# Patient Record
Sex: Male | Born: 1945 | Hispanic: No | Smoking: Never smoker
Health system: Southern US, Community
[De-identification: ages and names within clinical notes are randomized; demographics above are authoritative.]

## PROBLEM LIST (undated history)

## (undated) DIAGNOSIS — K635 Polyp of colon: Secondary | ICD-10-CM

## (undated) DIAGNOSIS — I639 Cerebral infarction, unspecified: Secondary | ICD-10-CM

## (undated) DIAGNOSIS — I2510 Atherosclerotic heart disease of native coronary artery without angina pectoris: Secondary | ICD-10-CM

## (undated) DIAGNOSIS — Z972 Presence of dental prosthetic device (complete) (partial): Secondary | ICD-10-CM

## (undated) DIAGNOSIS — N452 Orchitis: Secondary | ICD-10-CM

## (undated) DIAGNOSIS — I10 Essential (primary) hypertension: Secondary | ICD-10-CM

## (undated) DIAGNOSIS — E785 Hyperlipidemia, unspecified: Secondary | ICD-10-CM

## (undated) DIAGNOSIS — M1990 Unspecified osteoarthritis, unspecified site: Secondary | ICD-10-CM

## (undated) DIAGNOSIS — I739 Peripheral vascular disease, unspecified: Secondary | ICD-10-CM

## (undated) DIAGNOSIS — E119 Type 2 diabetes mellitus without complications: Secondary | ICD-10-CM

## (undated) DIAGNOSIS — R251 Tremor, unspecified: Secondary | ICD-10-CM

## (undated) HISTORY — PX: CAROTID ENDARTERECTOMY: SUR193

## (undated) HISTORY — PX: AMPUTATION TOE: SHX6595

## (undated) HISTORY — PX: CORONARY ARTERY BYPASS GRAFT: SHX141

## (undated) HISTORY — PX: ROTATOR CUFF REPAIR: SHX139

## (undated) HISTORY — PX: COLON SURGERY: SHX602

---

## 2007-02-28 ENCOUNTER — Ambulatory Visit: Payer: Self-pay | Admitting: Family Medicine

## 2007-10-11 ENCOUNTER — Ambulatory Visit: Payer: Self-pay | Admitting: Family Medicine

## 2008-05-29 ENCOUNTER — Ambulatory Visit: Payer: Self-pay | Admitting: Unknown Physician Specialty

## 2008-05-29 ENCOUNTER — Ambulatory Visit: Payer: Self-pay | Admitting: Cardiology

## 2008-07-11 ENCOUNTER — Ambulatory Visit: Payer: Self-pay | Admitting: Unknown Physician Specialty

## 2008-11-19 ENCOUNTER — Ambulatory Visit: Payer: Self-pay | Admitting: Family Medicine

## 2008-11-27 ENCOUNTER — Ambulatory Visit: Payer: Self-pay | Admitting: Family Medicine

## 2009-02-18 ENCOUNTER — Ambulatory Visit: Payer: Self-pay | Admitting: Family Medicine

## 2010-10-13 ENCOUNTER — Encounter: Payer: Self-pay | Admitting: Nurse Practitioner

## 2010-10-13 ENCOUNTER — Encounter: Payer: Self-pay | Admitting: Cardiothoracic Surgery

## 2011-12-17 ENCOUNTER — Ambulatory Visit: Payer: Self-pay | Admitting: Unknown Physician Specialty

## 2015-03-16 ENCOUNTER — Ambulatory Visit
Admission: EM | Admit: 2015-03-16 | Discharge: 2015-03-16 | Disposition: A | Discharge status: Other Acute Inpatient Hospital | Payer: Medicare Other | Attending: Family Medicine | Admitting: Family Medicine

## 2015-03-16 ENCOUNTER — Emergency Department
Admission: EM | Admit: 2015-03-16 | Discharge: 2015-03-16 | Disposition: A | Discharge status: Home / Self Care | Payer: Medicare Other | Attending: Emergency Medicine | Admitting: Emergency Medicine

## 2015-03-16 ENCOUNTER — Emergency Department: Payer: Medicare Other

## 2015-03-16 ENCOUNTER — Encounter: Payer: Self-pay | Admitting: Gynecology

## 2015-03-16 DIAGNOSIS — E119 Type 2 diabetes mellitus without complications: Secondary | ICD-10-CM | POA: Diagnosis not present

## 2015-03-16 DIAGNOSIS — Z7984 Long term (current) use of oral hypoglycemic drugs: Secondary | ICD-10-CM | POA: Diagnosis not present

## 2015-03-16 DIAGNOSIS — L03116 Cellulitis of left lower limb: Secondary | ICD-10-CM | POA: Diagnosis not present

## 2015-03-16 DIAGNOSIS — Z7982 Long term (current) use of aspirin: Secondary | ICD-10-CM | POA: Insufficient documentation

## 2015-03-16 DIAGNOSIS — E1169 Type 2 diabetes mellitus with other specified complication: Secondary | ICD-10-CM

## 2015-03-16 DIAGNOSIS — Z7902 Long term (current) use of antithrombotics/antiplatelets: Secondary | ICD-10-CM | POA: Insufficient documentation

## 2015-03-16 DIAGNOSIS — L089 Local infection of the skin and subcutaneous tissue, unspecified: Secondary | ICD-10-CM | POA: Diagnosis not present

## 2015-03-16 DIAGNOSIS — E11610 Type 2 diabetes mellitus with diabetic neuropathic arthropathy: Secondary | ICD-10-CM

## 2015-03-16 DIAGNOSIS — I10 Essential (primary) hypertension: Secondary | ICD-10-CM | POA: Diagnosis not present

## 2015-03-16 DIAGNOSIS — Z79899 Other long term (current) drug therapy: Secondary | ICD-10-CM | POA: Diagnosis not present

## 2015-03-16 DIAGNOSIS — L03119 Cellulitis of unspecified part of limb: Secondary | ICD-10-CM

## 2015-03-16 DIAGNOSIS — L539 Erythematous condition, unspecified: Secondary | ICD-10-CM | POA: Diagnosis present

## 2015-03-16 DIAGNOSIS — Z794 Long term (current) use of insulin: Secondary | ICD-10-CM | POA: Diagnosis not present

## 2015-03-16 DIAGNOSIS — E11628 Type 2 diabetes mellitus with other skin complications: Secondary | ICD-10-CM

## 2015-03-16 HISTORY — DX: Orchitis: N45.2

## 2015-03-16 HISTORY — DX: Atherosclerotic heart disease of native coronary artery without angina pectoris: I25.10

## 2015-03-16 HISTORY — DX: Hyperlipidemia, unspecified: E78.5

## 2015-03-16 HISTORY — DX: Peripheral vascular disease, unspecified: I73.9

## 2015-03-16 HISTORY — DX: Type 2 diabetes mellitus without complications: E11.9

## 2015-03-16 HISTORY — DX: Tremor, unspecified: R25.1

## 2015-03-16 HISTORY — DX: Polyp of colon: K63.5

## 2015-03-16 HISTORY — DX: Essential (primary) hypertension: I10

## 2015-03-16 LAB — CBC WITH DIFFERENTIAL/PLATELET
BASOS ABS: 0 10*3/uL (ref 0–0.1)
BASOS PCT: 1 %
EOS PCT: 3 %
Eosinophils Absolute: 0.2 10*3/uL (ref 0–0.7)
HEMATOCRIT: 37 % — AB (ref 40.0–52.0)
HEMOGLOBIN: 12.8 g/dL — AB (ref 13.0–18.0)
LYMPHS PCT: 16 %
Lymphs Abs: 1 10*3/uL (ref 1.0–3.6)
MCH: 30.2 pg (ref 26.0–34.0)
MCHC: 34.5 g/dL (ref 32.0–36.0)
MCV: 87.6 fL (ref 80.0–100.0)
Monocytes Absolute: 0.9 10*3/uL (ref 0.2–1.0)
Monocytes Relative: 14 %
NEUTROS ABS: 4.3 10*3/uL (ref 1.4–6.5)
Neutrophils Relative %: 66 %
Platelets: 216 10*3/uL (ref 150–440)
RBC: 4.23 MIL/uL — AB (ref 4.40–5.90)
RDW: 12.9 % (ref 11.5–14.5)
WBC: 6.4 10*3/uL (ref 3.8–10.6)

## 2015-03-16 LAB — COMPREHENSIVE METABOLIC PANEL
ALBUMIN: 3.5 g/dL (ref 3.5–5.0)
ALK PHOS: 79 U/L (ref 38–126)
ALT: 15 U/L — AB (ref 17–63)
AST: 16 U/L (ref 15–41)
Anion gap: 6 (ref 5–15)
BILIRUBIN TOTAL: 0.9 mg/dL (ref 0.3–1.2)
BUN: 16 mg/dL (ref 6–20)
CALCIUM: 9.4 mg/dL (ref 8.9–10.3)
CO2: 28 mmol/L (ref 22–32)
CREATININE: 0.81 mg/dL (ref 0.61–1.24)
Chloride: 101 mmol/L (ref 101–111)
GFR calc Af Amer: >60 mL/min (ref >60)
GFR calc non Af Amer: >60 mL/min (ref >60)
GLUCOSE: 351 mg/dL — AB (ref 65–99)
POTASSIUM: 4 mmol/L (ref 3.5–5.1)
Sodium: 135 mmol/L (ref 135–145)
TOTAL PROTEIN: 6.7 g/dL (ref 6.5–8.1)

## 2015-03-16 MED ORDER — DOXYCYCLINE HYCLATE 100 MG PO TABS
100.0000 mg | ORAL_TABLET | Freq: Once | ORAL | Status: AC
  Administered 2015-03-16: 100 mg via ORAL
  Filled 2015-03-16: qty 1

## 2015-03-16 MED ORDER — DOXYCYCLINE HYCLATE 100 MG PO TBEC: 100.0000 mg | DELAYED_RELEASE_TABLET | Freq: Two times a day (BID) | ORAL | Status: DC

## 2015-03-16 NOTE — ED Notes (Addendum)
Patient right bottom  of foot infection. Patient also has redness on top of right foot and warm to the touch. Patient is also diabetic.

## 2015-03-16 NOTE — ED Provider Notes (Signed)
[facility] Emergency Department Provider Note  ____________________________________________    I have reviewed the triage vital signs and the nursing notes.   HISTORY  Chief Complaint Skin Ulcer    HPI Jason [name] is a 70 y.o. male who was sent over from urgent care for evaluation of the foot ulcer. Patient reports he did not know that he had redness on his foot until his grandkids noticed it. He denies fevers or chills. He reports redness on his left foot. He does also report a chronic callous on the bottom of his foot which she reports is unchanged. He denies injury to the foot     Past Medical History  Diagnosis Date  . Diabetes mellitus without complication (HCC)   . Hypertension   . Hyperlipemia   . PVD (peripheral vascular disease) (HCC)   . Colon polyp   . Orchitis   . Tremor   . CAD (coronary artery disease)     There are no active problems to display for this patient.   Past Surgical History  Procedure Laterality Date  . Coronary artery bypass graft    . Amputation toe Left     big  . Carotid endarterectomy Right   . Rotator cuff repair Right   . Colon surgery      Current Outpatient Rx  Name  Route  Sig  Dispense  Refill  . aspirin 81 MG tablet   Oral   Take 81 mg by mouth daily.         . clopidogrel (PLAVIX) 75 MG tablet   Oral   Take 75 mg by mouth daily.         [name] doxycycline (DORYX) 100 MG EC tablet   Oral   Take 1 tablet (100 mg total) by mouth 2 (two) times daily.   20 tablet   0   . glimepiride (AMARYL) 4 MG tablet   Oral   Take 4 mg by mouth daily with breakfast.         . insulin glargine (LANTUS) 100 UNIT/ML injection   Subcutaneous   Inject into the skin at bedtime.         . insulin regular (NOVOLIN R,HUMULIN R) 100 units/mL injection   Subcutaneous   Inject into the skin 3 (three) times daily before meals.         . Magnesium 250 MG TABS   Oral   Take by mouth.         .  metFORMIN (GLUCOPHAGE) 1000 MG tablet   Oral   Take 1,000 mg by mouth 2 (two) times daily with a meal.         . Multiple Vitamin (MULTIVITAMIN) capsule   Oral   Take 1 capsule by mouth daily.         . Potassium Gluconate 595 MG CAPS   Oral   Take by mouth.         . pravastatin (PRAVACHOL) 40 MG tablet   Oral   Take 40 mg by mouth daily.         . propranolol (INDERAL) 60 MG tablet   Oral   Take 60 mg by mouth 3 (three) times daily.         . ramipril (ALTACE) 10 MG capsule   Oral   Take 10 mg by mouth daily.           Allergies Review of patient's allergies indicates no known allergies.  Family History  Problem Relation Age of Onset  . Diabetes Mother   . Diabetes Father     Social History Social History  Substance Use Topics  . Smoking status: Never Smoker   . Smokeless tobacco: Not on file  . Alcohol Use: No    Review of Systems  Constitutional: Negative for fever. Eyes: Negative for visual changes. ENT: Negative for sore throat Cardiovascular: Negative for chest pain. Respiratory: Negative for shortness of breath. Gastrointestinal: Negative for abdominal pain,.  Musculoskeletal: Negative for foot pain Skin: As above Neurological: Decreased sensation in both feet, chronic Psychiatric: No anxiety    ____________________________________________   PHYSICAL EXAM:  VITAL SIGNS: ED Triage Vitals  Enc Vitals Group     BP 03/16/15 1535 152/65 mmHg     Pulse Rate 03/16/15 1535 63     Resp 03/16/15 1535 18     Temp 03/16/15 1535 98.2 F (36.8 C)     Temp Source 03/16/15 1605 Oral     SpO2 03/16/15 1535 97 %     Weight --      Height --      Head Cir --      Peak Flow --      Pain Score 03/16/15 1535 0     Pain Loc --      Pain Edu? --      Excl. in GC? --      Constitutional: Alert and oriented. Well appearing and in no distress. Eyes: Conjunctivae are normal.  ENT   Head: Normocephalic and atraumatic.    Mouth/Throat: Mucous membranes are moist. Cardiovascular: Normal rate, regular rhythm. Normal and symmetric distal pulses are present in all extremities.  Respiratory: Normal respiratory effort without tachypnea nor retractions.  Gastrointestinal: Soft and non-tender in all quadrants. No distention. There is no CVA tenderness. Genitourinary: deferred Musculoskeletal: Nontender with normal range of motion in all extremities. Area of erythema to the top of the left foot laterally, patient reports mild tenderness to palpation. There is a chronic-appearing callus on the plantar surface of the center of the foot with no discharge or surrounding erythema Neurologic:  Normal speech and language. No gross focal neurologic deficits are appreciated. Skin:  Skin is warm, dry and intact. No rash noted. Psychiatric: Mood and affect are normal. Patient exhibits appropriate insight and judgment.  ____________________________________________    LABS (pertinent positives/negatives)  Labs Reviewed  CBC WITH DIFFERENTIAL/PLATELET - Abnormal; Notable for the following:    RBC 4.23 (*)    Hemoglobin 12.8 (*)    HCT 37.0 (*)    All other components within normal limits  COMPREHENSIVE METABOLIC PANEL - Abnormal; Notable for the following:    Glucose, Bld 351 (*)    ALT 15 (*)    All other components within normal limits    ____________________________________________   EKG  None  ____________________________________________    RADIOLOGY I have personally reviewed any xrays that were ordered on this patient: X-ray of the foot demonstrates  Charcot appearance  ____________________________________________   PROCEDURES  Procedure(s) performed: none  Critical Care performed: none  ____________________________________________   INITIAL IMPRESSION / ASSESSMENT AND PLAN / ED COURSE  Pertinent labs & imaging results that were available during my care of the patient were reviewed by me and  considered in my medical decision making (see chart for details).  Patient presents with cellulitis of the foot. He is a diabetic. His labs are very reassuring. He is in no distress and his vitals are normal. His x-ray is  not concerning for osteomyelitis. Patient has good follow-up with podiatry. We will start him on antibiotics here today and he will follow-up with podiatry early this week. Return precautions discussed  ____________________________________________   FINAL CLINICAL IMPRESSION(S) / ED DIAGNOSES  Final diagnoses:  Cellulitis of foot     Jene Everyobert Tandrea Kommer, MD 03/16/15 2125

## 2015-03-16 NOTE — Discharge Instructions (Signed)
How to Avoid Diabetes Problems  You can do a lot to prevent or slow down diabetes problems. Following your diabetes plan and taking care of yourself can reduce your risk of serious or life-threatening complications. Below, you will find certain things you can do to prevent diabetes problems.  MANAGE YOUR DIABETES  Follow your health care provider's, nurse educator's, and dietitian's instructions for managing your diabetes. They will teach you the basics of diabetes care. They can help answer questions you may have. Learn about diabetes and make healthy choices regarding eating and physical activity. Monitor your blood glucose level regularly. Your health care provider will help you decide how often to check your blood glucose level depending on your treatment goals and how well you are meeting them.   DO NOT USE NICOTINE  Nicotine and diabetes are a dangerous combination. Nicotine raises your risk for diabetes problems. If you quit using nicotine, you will lower your risk for heart attack, stroke, nerve disease, and kidney disease. Your cholesterol and your blood pressure levels may improve. Your blood circulation will also improve. Do not use any tobacco products, including cigarettes, chewing tobacco, or electronic cigarettes. If you need help quitting, ask your health care provider.  KEEP YOUR BLOOD PRESSURE UNDER CONTROL  Your health care provider will determine your individualized target blood pressure based on your age, your medicines, how long you have had diabetes, and any other medical conditions you have. Blood pressure consists of two numbers. Generally, the goal is to keep your top number (systolic pressure) at or below 130, and your bottom number (diastolic pressure) at or below 80. Your health care provider may recommend a lower target blood pressure reading, if appropriate. Meal planning, medicines, and exercise can help you reach your target blood pressure. Make sure your health care provider checks  your blood pressure at every visit.  KEEP YOUR CHOLESTEROL UNDER CONTROL  Normal cholesterol levels will help prevent heart disease and stroke. These are the biggest health problems for people with diabetes. Keeping cholesterol levels under control can also help with blood flow. Have your cholesterol level checked at least once a year. Your health care provider may prescribe a medicine known as a statin. Statins lower your cholesterol. If you are not taking a statin, ask your health care provider if you should be. Meal planning, exercise, and medicines can help you reach your cholesterol targets.   SCHEDULE AND KEEP YOUR ANNUAL PHYSICAL EXAMS AND EYE EXAMS  Your health care provider will tell you how often he or she wants to see you depending on your plan of treatment. It is important that you keep these appointments so that possible problems can be identified early and complications can be avoided or treated.  · Every visit with your health care provider should include your weight, blood pressure, and an evaluation of your blood glucose control.  · Your hemoglobin A1c should be checked:    At least twice a year if you are at your goal.    Every 3 months if there are changes in treatment.    If you are not meeting your goals.  · Your blood lipids should be checked yearly. You should also be checked yearly to see if you have protein in your urine (microalbumin).  · Schedule a dilated eye exam within 5 years of your diagnosis if you have type 1 diabetes, and then yearly. Schedule a dilated eye exam at diagnosis if you have type 2 diabetes, and then yearly. All   exams thereafter can be extended to every 2 to 3 years if one or more exams have been normal.  KEEP YOUR VACCINES CURRENT  It is recommended that you receive a flu (influenza) vaccine every year. It is also recommended that you receive a pneumonia (pneumococcal) vaccine. If you are 65 years of age or older and have never received a pneumonia vaccine, this  vaccine may be given as a series of two separate shots. Ask your health care provider which additional vaccines may be recommended.  TAKE CARE OF YOUR FEET   Diabetes may cause you to have a poor blood supply (circulation) to your legs and feet. Because of this, the skin may be thinner, break easier, and heal more slowly. You also may have nerve damage in your legs and feet, causing decreased feeling. You may not notice minor injuries to your feet that could lead to serious problems or infections. Taking care of your feet is very important.  Visual foot exams are performed at every routine medical visit. The exams check for cuts, injuries, or other problems with the feet. A comprehensive foot exam should be done yearly. This includes visual inspection as well as assessing foot pulses and testing for loss of sensation. You should also do the following:  · Inspect your feet daily for cuts, calluses, blisters, ingrown toenails, and signs of infection, such as redness, swelling, or pus.  · Wash and dry your feet thoroughly, especially between the toes.  · Avoid soaking your feet regularly in hot water baths.  · Moisturize dry skin with lotion, avoiding areas between your toes.  · Cut toenails straight across and file the edges.  · Avoid shoes that do not fit well or have areas that irritate your skin.  · Avoid going barefooted or wearing only socks. Your feet need protection.  TAKE CARE OF YOUR TEETH  People with poorly controlled diabetes are more likely to have gum (periodontal) disease. These infections make diabetes harder to control. Periodontal diseases, if left untreated, can lead to tooth loss. Brush your teeth twice a day, floss, and see your dentist for checkups and cleaning every 6 months, or 2 times a year.  ASK YOUR HEALTH CARE PROVIDER ABOUT TAKING ASPIRIN  Taking aspirin daily is recommended to help prevent cardiovascular disease in people with and without diabetes. Ask your health care provider if this  would benefit you and what dose he or she would recommend.  DRINK RESPONSIBLY  Moderate amounts of alcohol (less than 1 drink per day for adult women and less than 2 drinks per day for adult men) have a minimal effect on blood glucose if ingested with food. It is important to eat food with alcohol to avoid hypoglycemia. People should avoid alcohol if they have a history of alcohol abuse or dependence, if they are pregnant, and if they have liver disease, pancreatitis, advanced neuropathy, or severe hypertriglyceridemia.  LESSEN STRESS  Living with diabetes can be stressful. When you are under stress, your blood glucose may be affected in two ways:  · Stress hormones may cause your blood glucose to rise.  · You may be distracted from taking good care of yourself.  It is a good idea to be aware of your stress level and make changes that are necessary to help you better manage challenging situations. Support groups, planned relaxation, a hobby you enjoy, meditation, healthy relationships, and exercise all work to lower your stress level. If your efforts do not seem to be helping,   get help from your health care provider or a trained mental health professional.     This information is not intended to replace advice given to you by your health care provider. Make sure you discuss any questions you have with your health care provider.     Document Released: 09/15/2010 Document Revised: 01/18/2014 Document Reviewed: 02/21/2013  Elsevier Interactive Patient Education ©2016 [employer]

## 2015-03-16 NOTE — ED Notes (Signed)
Pt discharged to home.  Discharge instructions reviewed.  Verbalized understanding.  No questions or concerns at this time.  Teach back verified.  Pt in NAD.  No items left in ED.   

## 2015-03-16 NOTE — ED Notes (Signed)
Pt resting comfortably at this time.

## 2015-03-16 NOTE — ED Provider Notes (Signed)
CSN: [id number]     Arrival date & time 03/16/15  1053 History   First MD Initiated Contact with Patient 03/16/15 1245     Chief Complaint  Patient presents with  . Foot Pain   (Consider location/radiation/quality/duration/timing/severity/associated sxs/prior Treatment) HPI   Is a 70 year old insulin-dependent diabetic presents with a right Charcot foot infection. He states he's had is for a week and 2 days ago noticed the redness over the lateral dorsal foot. He has very little sensation in that foot and the infection was brought to his attention by his great-grandchildren. He states that his blood sugars have remained fairly steady he has not had any fever or chills.  Past Medical History  Diagnosis Date  . Diabetes mellitus without complication (HCC)   . Hypertension   . Hyperlipemia   . PVD (peripheral vascular disease) (HCC)   . Colon polyp   . Orchitis   . Tremor   . CAD (coronary artery disease)    Past Surgical History  Procedure Laterality Date  . Coronary artery bypass graft    . Amputation toe Left     big  . Carotid endarterectomy Right   . Rotator cuff repair Right   . Colon surgery     Family History  Problem Relation Age of Onset  . Diabetes Mother   . Diabetes Father    Social History  Substance Use Topics  . Smoking status: Never Smoker   . Smokeless tobacco: None  . Alcohol Use: No    Review of Systems  Constitutional: Positive for activity change. Negative for fever, chills, diaphoresis and fatigue.  Skin: Positive for color change and wound.  All other systems reviewed and are negative.   Allergies  Review of patient's allergies indicates no known allergies.  Home Medications   Prior to Admission medications   Medication Sig Start Date End Date Taking? Authorizing Provider  aspirin 81 MG tablet Take 81 mg by mouth daily.   Yes Historical Provider, MD  clopidogrel (PLAVIX) 75 MG tablet Take 75 mg by mouth daily.   Yes Historical Provider, MD   glimepiride (AMARYL) 4 MG tablet Take 4 mg by mouth daily with breakfast.   Yes Historical Provider, MD  insulin glargine (LANTUS) 100 UNIT/ML injection Inject into the skin at bedtime.   Yes Historical Provider, MD  insulin regular (NOVOLIN R,HUMULIN R) 100 units/mL injection Inject into the skin 3 (three) times daily before meals.   Yes Historical Provider, MD  Magnesium 250 MG TABS Take by mouth.   Yes Historical Provider, MD  metFORMIN (GLUCOPHAGE) 1000 MG tablet Take 1,000 mg by mouth 2 (two) times daily with a meal.   Yes Historical Provider, MD  Multiple Vitamin (MULTIVITAMIN) capsule Take 1 capsule by mouth daily.   Yes Historical Provider, MD  Potassium Gluconate 595 MG CAPS Take by mouth.   Yes Historical Provider, MD  pravastatin (PRAVACHOL) 40 MG tablet Take 40 mg by mouth daily.   Yes Historical Provider, MD  propranolol (INDERAL) 60 MG tablet Take 60 mg by mouth 3 (three) times daily.   Yes Historical Provider, MD  ramipril (ALTACE) 10 MG capsule Take 10 mg by mouth daily.   Yes Historical Provider, MD   Meds Ordered and Administered this Visit  Medications - No data to display  BP 153/75 mmHg  Pulse 60  Temp(Src) 97.9 F (36.6 C) (Oral)  Resp 16  Ht 5\' 7"  (1.702 m)  Wt 213 lb (96.616 kg)  BMI 33.35 kg/m2  SpO2 97% No data found.   Physical Exam  Constitutional: He is oriented to person, place, and time. He appears well-developed and well-nourished. No distress.  HENT:  Head: Normocephalic and atraumatic.  Eyes: Conjunctivae are normal. Pupils are equal, round, and reactive to light.  Neck: Normal range of motion. Neck supple.  Musculoskeletal: He exhibits edema and tenderness.  Examination of the right Charcot foot shows a ulcer on the bottom of the foot that is open areas a small laceration. As a blanchable erythema over the lateral dorsum foot and extending up into the proximal foot and lower leg. The calf is very firm in comparison to the left. It does not appear  to be any change in the diameter in comparison to the left.  Neurological: He is alert and oriented to person, place, and time.  Skin: Skin is warm and dry. He is not diaphoretic. There is erythema.  Psychiatric: He has a normal mood and affect. His behavior is normal. Judgment and thought content normal.  Nursing note and vitals reviewed.   ED Course  Procedures (including critical care time)  Labs Review Labs Reviewed - No data to display  Imaging Review No results found.   Visual Acuity Review  Right Eye Distance:   Left Eye Distance:   Bilateral Distance:    Right Eye Near:   Left Eye Near:    Bilateral Near:         MDM   1. 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 Diabetic infection of right foot (HCC)    . Discharge Medication List as of 03/16/2015  1:17 PM    Plan: 1. Diagnosis reviewed with patient A discussion with the patient regarding her findings today. I recommended that he go to [facility] for further evaluation and treatment. Do not have appropriate medications here to treat him interval he will likely need IV antibiotics. Also likely need debridement of the ulcer on the plantar surface. He understands and will transport himself to [facility]   Lutricia Feil, PA-C 03/16/15 1342

## 2015-03-16 NOTE — ED Notes (Signed)
Pt is a diabetic and has foot ulcer on left foot. Pt seen at urgent care and sent here for further eval.

## 2015-03-16 NOTE — Discharge Instructions (Signed)

## 2016-08-20 ENCOUNTER — Inpatient Hospital Stay
Admission: AD | Admit: 2016-08-20 | Discharge: 2016-08-24 | DRG: 256 | Disposition: A | Discharge status: Home Health | Payer: Medicare Other | Source: Ambulatory Visit | Attending: Internal Medicine | Admitting: Internal Medicine

## 2016-08-20 ENCOUNTER — Encounter: Payer: Self-pay | Admitting: Internal Medicine

## 2016-08-20 DIAGNOSIS — Z7982 Long term (current) use of aspirin: Secondary | ICD-10-CM | POA: Diagnosis not present

## 2016-08-20 DIAGNOSIS — E11621 Type 2 diabetes mellitus with foot ulcer: Secondary | ICD-10-CM | POA: Diagnosis present

## 2016-08-20 DIAGNOSIS — E1152 Type 2 diabetes mellitus with diabetic peripheral angiopathy with gangrene: Principal | ICD-10-CM | POA: Diagnosis present

## 2016-08-20 DIAGNOSIS — Z794 Long term (current) use of insulin: Secondary | ICD-10-CM | POA: Diagnosis not present

## 2016-08-20 DIAGNOSIS — L03032 Cellulitis of left toe: Secondary | ICD-10-CM | POA: Diagnosis present

## 2016-08-20 DIAGNOSIS — Z79899 Other long term (current) drug therapy: Secondary | ICD-10-CM

## 2016-08-20 DIAGNOSIS — I2510 Atherosclerotic heart disease of native coronary artery without angina pectoris: Secondary | ICD-10-CM | POA: Diagnosis present

## 2016-08-20 DIAGNOSIS — E785 Hyperlipidemia, unspecified: Secondary | ICD-10-CM | POA: Diagnosis present

## 2016-08-20 DIAGNOSIS — Z7902 Long term (current) use of antithrombotics/antiplatelets: Secondary | ICD-10-CM | POA: Diagnosis not present

## 2016-08-20 DIAGNOSIS — L97519 Non-pressure chronic ulcer of other part of right foot with unspecified severity: Secondary | ICD-10-CM | POA: Diagnosis present

## 2016-08-20 DIAGNOSIS — L03119 Cellulitis of unspecified part of limb: Secondary | ICD-10-CM

## 2016-08-20 DIAGNOSIS — Z951 Presence of aortocoronary bypass graft: Secondary | ICD-10-CM

## 2016-08-20 DIAGNOSIS — I10 Essential (primary) hypertension: Secondary | ICD-10-CM | POA: Diagnosis present

## 2016-08-20 DIAGNOSIS — Z89412 Acquired absence of left great toe: Secondary | ICD-10-CM

## 2016-08-20 DIAGNOSIS — E11628 Type 2 diabetes mellitus with other skin complications: Secondary | ICD-10-CM

## 2016-08-20 DIAGNOSIS — E1140 Type 2 diabetes mellitus with diabetic neuropathy, unspecified: Secondary | ICD-10-CM | POA: Diagnosis present

## 2016-08-20 DIAGNOSIS — I96 Gangrene, not elsewhere classified: Secondary | ICD-10-CM

## 2016-08-20 LAB — GLUCOSE, CAPILLARY
Glucose-Capillary: 402 mg/dL — ABNORMAL HIGH (ref 65–99)
Glucose-Capillary: 226 mg/dL — ABNORMAL HIGH (ref 65–99)

## 2016-08-20 LAB — CBC
HEMATOCRIT: 37 % — AB (ref 40.0–52.0)
HEMOGLOBIN: 13 g/dL (ref 13.0–18.0)
MCH: 31 pg (ref 26.0–34.0)
MCHC: 35 g/dL (ref 32.0–36.0)
MCV: 88.7 fL (ref 80.0–100.0)
Platelets: 173 10*3/uL (ref 150–440)
RBC: 4.18 MIL/uL — ABNORMAL LOW (ref 4.40–5.90)
RDW: 12.8 % (ref 11.5–14.5)
WBC: 9.7 10*3/uL (ref 3.8–10.6)

## 2016-08-20 LAB — CREATININE, SERUM
Creatinine, Ser: 1.08 mg/dL (ref 0.61–1.24)
GFR calc Af Amer: >60 mL/min (ref >60)

## 2016-08-20 LAB — SURGICAL PCR SCREEN
MRSA, PCR: NEGATIVE
STAPHYLOCOCCUS AUREUS: NEGATIVE

## 2016-08-20 MED ORDER — VANCOMYCIN HCL IN DEXTROSE 1-5 GM/200ML-% IV SOLN
1000.0000 mg | Freq: Two times a day (BID) | INTRAVENOUS | Status: DC
  Administered 2016-08-20 – 2016-08-21 (×3): 1000 mg via INTRAVENOUS
  Filled 2016-08-20 (×5): qty 200

## 2016-08-20 MED ORDER — INSULIN ASPART 100 UNIT/ML ~~LOC~~ SOLN
0.0000 [IU] | Freq: Three times a day (TID) | SUBCUTANEOUS | Status: DC
  Administered 2016-08-21 (×2): 3 [IU] via SUBCUTANEOUS
  Administered 2016-08-21: 2 [IU] via SUBCUTANEOUS
  Administered 2016-08-22 – 2016-08-23 (×4): 3 [IU] via SUBCUTANEOUS
  Administered 2016-08-23: 2 [IU] via SUBCUTANEOUS
  Administered 2016-08-23: 3 [IU] via SUBCUTANEOUS
  Administered 2016-08-24 (×2): 5 [IU] via SUBCUTANEOUS
  Administered 2016-08-24: 3 [IU] via SUBCUTANEOUS
  Filled 2016-08-20 (×13): qty 1

## 2016-08-20 MED ORDER — ADULT MULTIVITAMIN W/MINERALS CH
1.0000 | ORAL_TABLET | Freq: Every day | ORAL | Status: DC
  Administered 2016-08-20 – 2016-08-24 (×4): 1 via ORAL
  Filled 2016-08-20 (×4): qty 1

## 2016-08-20 MED ORDER — INSULIN ASPART 100 UNIT/ML ~~LOC~~ SOLN
12.0000 [IU] | Freq: Once | SUBCUTANEOUS | Status: AC
  Administered 2016-08-20: 12 [IU] via SUBCUTANEOUS

## 2016-08-20 MED ORDER — RAMIPRIL 10 MG PO CAPS
10.0000 mg | ORAL_CAPSULE | Freq: Every day | ORAL | Status: DC
  Administered 2016-08-20 – 2016-08-24 (×4): 10 mg via ORAL
  Filled 2016-08-20 (×5): qty 1

## 2016-08-20 MED ORDER — PRAVASTATIN SODIUM 20 MG PO TABS
40.0000 mg | ORAL_TABLET | Freq: Every day | ORAL | Status: DC
  Administered 2016-08-20 – 2016-08-24 (×4): 40 mg via ORAL
  Filled 2016-08-20 (×4): qty 2

## 2016-08-20 MED ORDER — INSULIN ASPART 100 UNIT/ML ~~LOC~~ SOLN
SUBCUTANEOUS | Status: AC
  Filled 2016-08-20: qty 1

## 2016-08-20 MED ORDER — DEXTROSE 5 % IV SOLN
1.0000 g | Freq: Three times a day (TID) | INTRAVENOUS | Status: DC
  Administered 2016-08-20 – 2016-08-24 (×12): 1 g via INTRAVENOUS
  Filled 2016-08-20 (×17): qty 1

## 2016-08-20 MED ORDER — HEPARIN SODIUM (PORCINE) 5000 UNIT/ML IJ SOLN
5000.0000 [IU] | Freq: Three times a day (TID) | INTRAMUSCULAR | Status: DC
  Administered 2016-08-20 – 2016-08-24 (×12): 5000 [IU] via SUBCUTANEOUS
  Filled 2016-08-20 (×12): qty 1

## 2016-08-20 MED ORDER — VANCOMYCIN HCL IN DEXTROSE 1-5 GM/200ML-% IV SOLN
1000.0000 mg | Freq: Once | INTRAVENOUS | Status: AC
  Administered 2016-08-20: 1000 mg via INTRAVENOUS
  Filled 2016-08-20: qty 200

## 2016-08-20 MED ORDER — DOCUSATE SODIUM 100 MG PO CAPS
100.0000 mg | ORAL_CAPSULE | Freq: Two times a day (BID) | ORAL | Status: DC | PRN
  Administered 2016-08-23: 100 mg via ORAL
  Filled 2016-08-20: qty 1

## 2016-08-20 MED ORDER — GLIMEPIRIDE 4 MG PO TABS: 4.0000 mg | ORAL_TABLET | Freq: Every day | ORAL | Status: DC

## 2016-08-20 MED ORDER — PROPRANOLOL HCL 20 MG PO TABS
60.0000 mg | ORAL_TABLET | Freq: Three times a day (TID) | ORAL | Status: DC
  Administered 2016-08-20 – 2016-08-22 (×7): 60 mg via ORAL
  Filled 2016-08-20 (×8): qty 3

## 2016-08-20 MED ORDER — DEXTROSE 5 % IV SOLN
2.0000 g | Freq: Once | INTRAVENOUS | Status: AC
  Administered 2016-08-20: 2 g via INTRAVENOUS
  Filled 2016-08-20: qty 2

## 2016-08-20 MED ORDER — ASPIRIN EC 81 MG PO TBEC
81.0000 mg | DELAYED_RELEASE_TABLET | Freq: Every day | ORAL | Status: DC
  Administered 2016-08-20 – 2016-08-24 (×4): 81 mg via ORAL
  Filled 2016-08-20 (×4): qty 1

## 2016-08-20 NOTE — Consult Note (Signed)
Reason for Consult: Gangrenous ulceration right foot Referring Physician: Alric Geise is an 71 y.o. male.  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 He has been managed outpatient but recently has become worse with some boggy necrotic tissue. Decision was made for hospitalization with debridement and ray resection.  Past Medical History:  Diagnosis Date  . CAD (coronary artery disease)   . Colon polyp   . Diabetes mellitus without complication (HCC)   . Hyperlipemia   . Hypertension   . Orchitis   . PVD (peripheral vascular disease) (HCC)   . Tremor     Past Surgical History:  Procedure Laterality Date  . AMPUTATION TOE Left    big  . CAROTID ENDARTERECTOMY Right   . COLON SURGERY    . CORONARY ARTERY BYPASS GRAFT    . ROTATOR CUFF REPAIR Right     Family History  Problem Relation Age of Onset  . Diabetes Mother   . Diabetes Father     Social History:  reports that he has never smoked. He does not have any smokeless tobacco history on file. He reports that he does not drink alcohol or use drugs.  Allergies: No Known Allergies  Medications:  Scheduled: . aspirin EC  81 mg Oral Daily  . [START ON 08/21/2016] glimepiride  4 mg Oral Q breakfast  . heparin  5,000 Units Subcutaneous Q8H  . insulin aspart  0-9 Units Subcutaneous TID WC  . multivitamin with minerals  1 tablet Oral Daily  . pravastatin  40 mg Oral Daily  . propranolol  60 mg Oral TID  . ramipril  10 mg Oral Daily    No results found for this or any previous visit (from the past 48 hour(s)).  No results found.  Review of Systems  Constitutional: Negative for chills and weight loss.  HENT: Negative.   Eyes: Negative.   Respiratory: Negative.   Cardiovascular: Negative.   Gastrointestinal: Negative for nausea and vomiting.  Genitourinary: Negative.   Musculoskeletal: Negative.   Skin:       Chronic draining ulcer on the side  of his right foot.  Neurological:       Significant numbness in both of his feet from diabetic neuropathy.  Endo/Heme/Allergies: Negative.   Psychiatric/Behavioral: Negative.    Blood pressure (!) 157/83, pulse 73, temperature 98.3 F (36.8 C), temperature source Oral, resp. rate 20, height 5' 9.02" (1.753 m), weight 97.5 kg (214 lb 15.2 oz), SpO2 98 %. Physical Exam  Cardiovascular:  DP and PT pulses are diminished but palpable.  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 Stiff range of motion. Muscle testing deferred.  Neurological:  Loss of protective threshold monofilament wire in the foot and toes bilateral.  Skin:  Necrotic and gangrenous ulceration is noted over the lateral aspect of the right fifth metatarsal and fifth toe. Some cyanotic discolorations are noted in the plantar aspect of the toe. Also an eschar with ulceration on the plantar aspect of the fifth metatarsal. Moderate drainage and expressible purulence is noted. Some cellulitis and edema is noted in the right foot with an asendind streak anteriorly.    Assessment/Plan: Assessment: 1. Ulceration with gangrenous changes left forefoot and fifth toe. 2. Diabetes with associated neuropathy.  Plan: Discussed with the patient the need for debridement of the infected soft tissue as well as amputation of the fifth toe with debridement of some of the  fifth metatarsal. Discussed risks and benefits of the procedure including inability to heal due to his diabetes or continued infection. Questions were invited. Patient elects to proceed with the surgery which will be scheduled for tomorrow morning. Consent form for fifth ray resection right foot. Nothing by mouth after midnight. Plan for surgery tomorrow morning  [name] W Avacyn Kloosterman 08/20/2016, 1:37 PM

## 2016-08-20 NOTE — H&P (Signed)
[employer] - [place] at [facility]   PATIENT NAME: Jason [name]    MR#:  [id number]  DATE OF BIRTH:  08/04/1945  DATE OF ADMISSION:  08/20/2016  PRIMARY CARE PHYSICIAN: Marina Goodell, MD   REQUESTING/REFERRING PHYSICIAN: Dr.Fowler  CHIEF COMPLAINT:  No chief complaint on file.   HISTORY OF PRESENT ILLNESS: Jason [name]  is a 71 y.o. male with a known history of coronary artery disease, diabetes, hyperlipidemia, hypertension, peripheral vascular disease- started noticing redness and some pain on his right side of the foot especially around his fifth toe. This is getting worse for last 2-3 days and so he went to podiatry clinic today where Dr. Ether Griffins who felt he may have some gangrene as x-ray on the foot did not show any osteomyelitis but he felt patient may need to have amputation of his fifth toe and so suggested to go to hospital for further management and called [place] for direct admission. Patient denies any fever or chills.  PAST MEDICAL HISTORY:   Past Medical History:  Diagnosis Date  . CAD (coronary artery disease)   . Colon polyp   . Diabetes mellitus without complication (HCC)   . Hyperlipemia   . Hypertension   . Orchitis   . PVD (peripheral vascular disease) (HCC)   . Tremor     PAST SURGICAL HISTORY: Past Surgical History:  Procedure Laterality Date  . AMPUTATION TOE Left    big  . CAROTID ENDARTERECTOMY Right   . COLON SURGERY    . CORONARY ARTERY BYPASS GRAFT    . ROTATOR CUFF REPAIR Right     SOCIAL HISTORY:  Social History  Substance Use Topics  . Smoking status: Never Smoker  . Smokeless tobacco: Not on file  . Alcohol use No    FAMILY HISTORY:  Family History  Problem Relation Age of Onset  . Diabetes Mother   . Diabetes Father     DRUG ALLERGIES: No Known Allergies  REVIEW OF SYSTEMS:   CONSTITUTIONAL: No fever, fatigue or weakness.  EYES: No blurred or double vision.  EARS, NOSE, AND THROAT: No tinnitus or ear pain.   RESPIRATORY: No cough, shortness of breath, wheezing or hemoptysis.  CARDIOVASCULAR: No chest pain, orthopnea, edema.  GASTROINTESTINAL: No nausea, vomiting, diarrhea or abdominal pain.  GENITOURINARY: No dysuria, hematuria.  ENDOCRINE: No polyuria, nocturia,  HEMATOLOGY: No anemia, easy bruising or bleeding SKIN: No rash or lesion. MUSCULOSKELETAL: No joint pain or arthritis.  Right foot lateral side redness and pain NEUROLOGIC: No tingling, numbness, weakness.  PSYCHIATRY: No anxiety or depression.   MEDICATIONS AT HOME:  Prior to Admission medications   Medication Sig Start Date End Date Taking? Authorizing Provider  amoxicillin-clavulanate (AUGMENTIN) 875-125 MG tablet Take 1 tablet by mouth every 12 (twelve) hours. X 10 days 08/18/16 08/28/16 Yes [provider]  aspirin 81 MG tablet Take 81 mg by mouth daily.   Yes [provider]  clopidogrel (PLAVIX) 75 MG tablet Take 75 mg by mouth daily.   Yes [provider]  glimepiride (AMARYL) 4 MG tablet Take 4 mg by mouth 2 (two) times daily.    Yes [provider]  insulin glargine (LANTUS) 100 UNIT/ML injection Inject 9 Units into the skin at bedtime.    Yes [provider]  Magnesium 250 MG TABS Take by mouth.   Yes [provider]  metFORMIN (GLUCOPHAGE) 1000 MG tablet Take 1,000 mg by mouth 2 (two) times daily with a meal.  Yes [provider]  Multiple Vitamin (MULTIVITAMIN) capsule Take 1 capsule by mouth daily.   Yes [provider]  Potassium Gluconate 595 MG CAPS Take by mouth.   Yes [provider]  pravastatin (PRAVACHOL) 40 MG tablet Take 40 mg by mouth daily.   Yes [provider]  propranolol (INDERAL) 60 MG tablet Take 60 mg by mouth 3 (three) times daily.   Yes [provider]  ramipril (ALTACE) 10 MG capsule Take 10 mg by mouth daily.   Yes [provider]  doxycycline (DORYX) 100 MG EC tablet Take 1 tablet (100 mg  total) by mouth 2 (two) times daily. Patient not taking: Reported on 08/20/2016 03/16/15   Jene Every, MD  insulin regular (NOVOLIN R,HUMULIN R) 100 units/mL injection Inject 5-10 Units into the skin 3 (three) times daily before meals.     [provider]      PHYSICAL EXAMINATION:   VITAL SIGNS: Blood pressure (!) 158/65, pulse 63, temperature 98.6 F (37 C), temperature source Oral, resp. rate 18, height 5' 9.02" (1.753 m), weight 97.5 kg (214 lb 15.2 oz), SpO2 98 %.  GENERAL:  71 y.o.-year-old patient lying in the bed with no acute distress.  EYES: Pupils equal, round, reactive to light and accommodation. No scleral icterus. Extraocular muscles intact.  HEENT: Head atraumatic, normocephalic. Oropharynx and nasopharynx clear.  NECK:  Supple, no jugular venous distention. No thyroid enlargement, no tenderness.  LUNGS: Normal breath sounds bilaterally, no wheezing, rales,rhonchi or crepitation. No use of accessory muscles of respiration.  CARDIOVASCULAR: S1, S2 normal. No murmurs, rubs, or gallops.  ABDOMEN: Soft, nontender, nondistended. Bowel sounds present. No organomegaly or mass.  EXTREMITIES: No pedal edema, cyanosis, or clubbing. Left great toe amputation. Right side 4 to have redness and tenderness in the lateral half with gangrenous changes on the fifth toe. NEUROLOGIC: Cranial nerves II through XII are intact. Muscle strength 5/5 in all extremities. Sensation intact. Gait not checked.  PSYCHIATRIC: The patient is alert and oriented x 3.  SKIN: No obvious rash, lesion, or ulcer.   LABORATORY PANEL:   CBC  Recent Labs Lab 08/20/16 1354  WBC 9.7  HGB 13.0  HCT 37.0*  PLT 173  MCV 88.7  MCH 31.0  MCHC 35.0  RDW 12.8   ------------------------------------------------------------------------------------------------------------------  Chemistries   Recent Labs Lab 08/20/16 1354  CREATININE 1.08    ------------------------------------------------------------------------------------------------------------------ estimated creatinine clearance is 73.3 mL/min (by C-G formula based on SCr of 1.08 mg/dL). ------------------------------------------------------------------------------------------------------------------ No results for input(s): TSH, T4TOTAL, T3FREE, THYROIDAB in the last 72 hours.  Invalid input(s): FREET3   Coagulation profile No results for input(s): INR, PROTIME in the last 168 hours. ------------------------------------------------------------------------------------------------------------------- No results for input(s): DDIMER in the last 72 hours. -------------------------------------------------------------------------------------------------------------------  Cardiac Enzymes No results for input(s): CKMB, TROPONINI, MYOGLOBIN in the last 168 hours.  Invalid input(s): CK ------------------------------------------------------------------------------------------------------------------ Invalid input(s): POCBNP  ---------------------------------------------------------------------------------------------------------------  Urinalysis No results found for: COLORURINE, APPEARANCEUR, LABSPEC, PHURINE, GLUCOSEU, HGBUR, BILIRUBINUR, KETONESUR, PROTEINUR, UROBILINOGEN, NITRITE, LEUKOCYTESUR   RADIOLOGY: No results found.  EKG: No orders found for this or any previous visit.  IMPRESSION AND PLAN:  * Gangrene with cellulitis   Left fifth toe   IV vancomycin and cefepime for now.   X-ray does not show any signs of osteomyelitis.   Podiatry was consulted and plan is for amputation on the toe tomorrow.  * Diabetes   We'll hold oral medication and keep on sliding scale coverage.  * History of coronary artery disease  Continue aspirin, ramipril, pravastatin. We'll hold Plavix because of surgery.  * Hyperlipidemia   Continue statin.  All the records are  reviewed and case discussed with ED provider. Management plans discussed with the patient, family and they are in agreement.  CODE STATUS: Full code    Code Status Orders        Start     Ordered   08/20/16 1325  Full code  Continuous     08/20/16 1324    Code Status History    Date Active Date Inactive Code Status Order ID Comments User Context   This patient has a current code status but no historical code status.       TOTAL TIME TAKING CARE OF THIS PATIENT: 50 minutes.    Altamese DillingVACHHANI, Erendida Wrenn M.D on 08/20/2016   Between 7am to 6pm - Pager - [phone]  After 6pm go to [email or web address] - password [email or web address]  [facility]  Office  [phone]  CC: Primary care physician; Marina GoodellFeldpausch, Dale E, MD   Note: This dictation was prepared with Dragon dictation along with smaller phrase technology. Any transcriptional errors that result from this process are unintentional.

## 2016-08-20 NOTE — Progress Notes (Signed)
Pharmacy Antibiotic Note  Jason BathWilliam H [name] is a 71 y.o. male admitted on 08/20/2016 with wound infection.  Pharmacy has been consulted for cefepime and vancomycin dosing.  Plan: 1. Cefepime 2 gm IV x 1 followed by cefepime 1 gm IV Q8H 2. Vancomycin 1 gm IV x 1 followed in approximately 6 hours (stacked dosing) by vancomycin 1 gm IV Q12H, predicted trough 15 mcg/mL. Pharmacy will continue to follow and adjust as needed to maintain trough 15 to 20 mcg/mL.   Vd 57 L, Ke 0.065 hr-1, T1/2 10.6 hr  Height: 5' 9.02" (175.3 cm) Weight: 214 lb 15.2 oz (97.5 kg) IBW/kg (Calculated) : 70.74  Temp (24hrs), Avg:98.3 F (36.8 C), Min:98.3 F (36.8 C), Max:98.3 F (36.8 C)   Recent Labs Lab 08/20/16 1354  WBC 9.7  CREATININE 1.08    Estimated Creatinine Clearance: 73.3 mL/min (by C-G formula based on SCr of 1.08 mg/dL).    No Known Allergies  Thank you for allowing pharmacy to be a part of this patient's care.  Jason FrostNathan A Geral [name], Pharm.D., BCPS Clinical Pharmacist 08/20/2016 2:48 PM

## 2016-08-21 ENCOUNTER — Inpatient Hospital Stay: Payer: Medicare Other | Admitting: Anesthesiology

## 2016-08-21 ENCOUNTER — Encounter
Admission: AD | Disposition: A | Discharge status: Home Health | Payer: Self-pay | Source: Ambulatory Visit | Attending: Internal Medicine

## 2016-08-21 HISTORY — PX: AMPUTATION: SHX166

## 2016-08-21 LAB — GLUCOSE, CAPILLARY
GLUCOSE-CAPILLARY: 214 mg/dL — AB (ref 65–99)
Glucose-Capillary: 198 mg/dL — ABNORMAL HIGH (ref 65–99)
Glucose-Capillary: 203 mg/dL — ABNORMAL HIGH (ref 65–99)
Glucose-Capillary: 229 mg/dL — ABNORMAL HIGH (ref 65–99)

## 2016-08-21 LAB — BASIC METABOLIC PANEL
Anion gap: 7 (ref 5–15)
BUN: 19 mg/dL (ref 6–20)
CHLORIDE: 105 mmol/L (ref 101–111)
CO2: 27 mmol/L (ref 22–32)
CREATININE: 0.83 mg/dL (ref 0.61–1.24)
Calcium: 9 mg/dL (ref 8.9–10.3)
GFR calc non Af Amer: >60 mL/min (ref >60)
GLUCOSE: 196 mg/dL — AB (ref 65–99)
Potassium: 4 mmol/L (ref 3.5–5.1)
Sodium: 139 mmol/L (ref 135–145)

## 2016-08-21 LAB — CBC
HCT: 36.9 % — ABNORMAL LOW (ref 40.0–52.0)
Hemoglobin: 12.7 g/dL — ABNORMAL LOW (ref 13.0–18.0)
MCH: 30.2 pg (ref 26.0–34.0)
MCHC: 34.4 g/dL (ref 32.0–36.0)
MCV: 87.8 fL (ref 80.0–100.0)
PLATELETS: 186 10*3/uL (ref 150–440)
RBC: 4.2 MIL/uL — AB (ref 4.40–5.90)
RDW: 12.8 % (ref 11.5–14.5)
WBC: 7.2 10*3/uL (ref 3.8–10.6)

## 2016-08-21 SURGERY — AMPUTATION, FOOT, RAY
Anesthesia: General | Laterality: Right

## 2016-08-21 MED ORDER — HYDRALAZINE HCL 20 MG/ML IJ SOLN
10.0000 mg | Freq: Four times a day (QID) | INTRAMUSCULAR | Status: DC | PRN
  Administered 2016-08-21: 10 mg via INTRAVENOUS
  Filled 2016-08-21: qty 1

## 2016-08-21 MED ORDER — INSULIN GLARGINE 100 UNIT/ML ~~LOC~~ SOLN
9.0000 [IU] | Freq: Every day | SUBCUTANEOUS | Status: DC
  Administered 2016-08-21 – 2016-08-22 (×2): 9 [IU] via SUBCUTANEOUS
  Filled 2016-08-21 (×2): qty 0.09

## 2016-08-21 MED ORDER — BUPIVACAINE HCL 0.5 % IJ SOLN
INTRAMUSCULAR | Status: DC | PRN
  Administered 2016-08-21: 10 mL

## 2016-08-21 MED ORDER — PROPOFOL 10 MG/ML IV BOLUS
INTRAVENOUS | Status: DC | PRN
  Administered 2016-08-21: 40 mg via INTRAVENOUS

## 2016-08-21 MED ORDER — PHENYLEPHRINE HCL 10 MG/ML IJ SOLN
INTRAMUSCULAR | Status: DC | PRN
  Administered 2016-08-21: 100 ug via INTRAVENOUS
  Administered 2016-08-21: 300 ug via INTRAVENOUS

## 2016-08-21 MED ORDER — BUPIVACAINE HCL (PF) 0.5 % IJ SOLN
INTRAMUSCULAR | Status: AC
  Filled 2016-08-21: qty 30

## 2016-08-21 MED ORDER — OXYCODONE HCL 5 MG PO TABS: 5.0000 mg | ORAL_TABLET | Freq: Once | ORAL | Status: DC | PRN

## 2016-08-21 MED ORDER — MIDAZOLAM HCL 2 MG/2ML IJ SOLN
INTRAMUSCULAR | Status: AC
  Filled 2016-08-21: qty 2

## 2016-08-21 MED ORDER — MIDAZOLAM HCL 2 MG/2ML IJ SOLN
INTRAMUSCULAR | Status: DC | PRN
  Administered 2016-08-21: 2 mg via INTRAVENOUS

## 2016-08-21 MED ORDER — PROMETHAZINE HCL 25 MG/ML IJ SOLN: 6.2500 mg | INTRAMUSCULAR | Status: DC | PRN

## 2016-08-21 MED ORDER — FENTANYL CITRATE (PF) 100 MCG/2ML IJ SOLN: 25.0000 ug | INTRAMUSCULAR | Status: DC | PRN

## 2016-08-21 MED ORDER — PROPOFOL 500 MG/50ML IV EMUL
INTRAVENOUS | Status: AC
  Filled 2016-08-21: qty 50

## 2016-08-21 MED ORDER — VANCOMYCIN HCL 1000 MG IV SOLR
INTRAVENOUS | Status: AC
  Filled 2016-08-21: qty 1000

## 2016-08-21 MED ORDER — FENTANYL CITRATE (PF) 100 MCG/2ML IJ SOLN
INTRAMUSCULAR | Status: DC | PRN
  Administered 2016-08-21: 100 ug via INTRAVENOUS

## 2016-08-21 MED ORDER — SODIUM CHLORIDE 0.9 % IV SOLN
INTRAVENOUS | Status: DC | PRN
  Administered 2016-08-21 (×2): via INTRAVENOUS

## 2016-08-21 MED ORDER — ONDANSETRON HCL 4 MG/2ML IJ SOLN
INTRAMUSCULAR | Status: DC | PRN
  Administered 2016-08-21: 4 mg via INTRAVENOUS

## 2016-08-21 MED ORDER — LIDOCAINE HCL (PF) 1 % IJ SOLN
INTRAMUSCULAR | Status: AC
  Filled 2016-08-21: qty 30

## 2016-08-21 MED ORDER — NEOMYCIN-POLYMYXIN B GU 40-200000 IR SOLN
Status: AC
  Filled 2016-08-21: qty 2

## 2016-08-21 MED ORDER — FENTANYL CITRATE (PF) 100 MCG/2ML IJ SOLN
INTRAMUSCULAR | Status: AC
  Filled 2016-08-21: qty 2

## 2016-08-21 MED ORDER — MEPERIDINE HCL 50 MG/ML IJ SOLN: 6.2500 mg | INTRAMUSCULAR | Status: DC | PRN

## 2016-08-21 MED ORDER — PROPOFOL 500 MG/50ML IV EMUL
INTRAVENOUS | Status: DC | PRN
  Administered 2016-08-21: 100 ug/kg/min via INTRAVENOUS

## 2016-08-21 MED ORDER — OXYCODONE HCL 5 MG/5ML PO SOLN: 5.0000 mg | Freq: Once | ORAL | Status: DC | PRN

## 2016-08-21 MED ORDER — OXYCODONE-ACETAMINOPHEN 5-325 MG PO TABS: 1.0000 | ORAL_TABLET | ORAL | Status: DC | PRN

## 2016-08-21 SURGICAL SUPPLY — 48 items
BANDAGE ACE 4X5 VEL STRL LF (GAUZE/BANDAGES/DRESSINGS) ×3 IMPLANT
BLADE MED AGGRESSIVE (BLADE) ×3 IMPLANT
BLADE OSC/SAGITTAL MD 5.5X18 (BLADE) IMPLANT
BLADE SURG 15 STRL LF DISP TIS (BLADE) ×2 IMPLANT
BLADE SURG 15 STRL SS (BLADE) ×4
BLADE SURG MINI STRL (BLADE) ×3 IMPLANT
BNDG ESMARK 4X12 TAN STRL LF (GAUZE/BANDAGES/DRESSINGS) ×3 IMPLANT
BNDG GAUZE 4.5X4.1 6PLY STRL (MISCELLANEOUS) ×6 IMPLANT
CANISTER SUCT 1200ML W/VALVE (MISCELLANEOUS) IMPLANT
CANISTER SUCT 3000ML PPV (MISCELLANEOUS) ×6 IMPLANT
CLOSURE WOUND 1/4X4 (GAUZE/BANDAGES/DRESSINGS)
CUFF TOURN 18 STER (MISCELLANEOUS) ×3 IMPLANT
CUFF TOURN DUAL PL 12 NO SLV (MISCELLANEOUS) IMPLANT
DRAPE FLUOR MINI C-ARM 54X84 (DRAPES) IMPLANT
DURAPREP 26ML APPLICATOR (WOUND CARE) ×3 IMPLANT
ELECT REM PT RETURN 9FT ADLT (ELECTROSURGICAL) ×3
ELECTRODE REM PT RTRN 9FT ADLT (ELECTROSURGICAL) ×1 IMPLANT
GAUZE PETRO XEROFOAM 1X8 (MISCELLANEOUS) ×3 IMPLANT
GAUZE SPONGE 4X4 12PLY STRL (GAUZE/BANDAGES/DRESSINGS) ×3 IMPLANT
GAUZE STRETCH 2X75IN STRL (MISCELLANEOUS) IMPLANT
GAUZE XEROFORM 4X4 STRL (GAUZE/BANDAGES/DRESSINGS) ×3 IMPLANT
GLOVE BIO SURGEON STRL SZ7.5 (GLOVE) ×9 IMPLANT
GLOVE INDICATOR 8.0 STRL GRN (GLOVE) ×3 IMPLANT
GOWN STRL REUS W/ TWL LRG LVL3 (GOWN DISPOSABLE) ×2 IMPLANT
GOWN STRL REUS W/TWL LRG LVL3 (GOWN DISPOSABLE) ×4
HANDPIECE VERSAJET DEBRIDEMENT (MISCELLANEOUS) ×3 IMPLANT
KIT RM TURNOVER STRD PROC AR (KITS) ×3 IMPLANT
KIT STIMULAN RAPID CURE 5CC (Orthopedic Implant) ×3 IMPLANT
LABEL OR SOLS (LABEL) ×3 IMPLANT
NEEDLE FILTER BLUNT 18X 1/2SAF (NEEDLE) ×2
NEEDLE FILTER BLUNT 18X1 1/2 (NEEDLE) ×1 IMPLANT
NEEDLE HYPO 25X1 1.5 SAFETY (NEEDLE) ×6 IMPLANT
NS IRRIG 500ML POUR BTL (IV SOLUTION) ×3 IMPLANT
PACK EXTREMITY ARMC (MISCELLANEOUS) ×3 IMPLANT
PAD ABD DERMACEA PRESS 5X9 (GAUZE/BANDAGES/DRESSINGS) ×6 IMPLANT
SOL .9 NS 3000ML IRR  AL (IV SOLUTION) ×2
SOL .9 NS 3000ML IRR UROMATIC (IV SOLUTION) ×1 IMPLANT
SOL PREP PVP 2OZ (MISCELLANEOUS) ×3
SOLUTION PREP PVP 2OZ (MISCELLANEOUS) ×1 IMPLANT
STOCKINETTE STRL 6IN 960660 (GAUZE/BANDAGES/DRESSINGS) ×3 IMPLANT
STRIP CLOSURE SKIN 1/4X4 (GAUZE/BANDAGES/DRESSINGS) IMPLANT
SUT ETHILON 3-0 FS-10 30 BLK (SUTURE) ×3
SUT ETHILON 4-0 (SUTURE)
SUT ETHILON 4-0 FS2 18XMFL BLK (SUTURE)
SUTURE EHLN 3-0 FS-10 30 BLK (SUTURE) ×1 IMPLANT
SUTURE ETHLN 4-0 FS2 18XMF BLK (SUTURE) IMPLANT
SWAB DUAL CULTURE TRANS RED ST (MISCELLANEOUS) ×3 IMPLANT
SYRINGE 10CC LL (SYRINGE) ×3 IMPLANT

## 2016-08-21 NOTE — Transfer of Care (Signed)
Immediate Anesthesia Transfer of Care Note  Patient: Jason [name]  Procedure(s) Performed: Procedure(s): RAY RESECTION-RIGHT 5TH RAY (Right)  Patient Location: PACU  Anesthesia Type:General  Level of Consciousness: sedated  Airway & Oxygen Therapy: Patient Spontanous Breathing and Patient connected to face mask oxygen  Post-op Assessment: Report given to RN and Post -op Vital signs reviewed and stable  Post vital signs: Reviewed and stable  Last Vitals:  Vitals:   08/21/16 0539 08/21/16 0725  BP: (!) 161/72 (!) 165/68  Pulse: (!) 56 (!) 56  Resp:  18  Temp:  36.6 C  SpO2: 99% 95%    Last Pain:  Vitals:   08/21/16 0725  TempSrc: Oral  PainSc:          Complications: No apparent anesthesia complications

## 2016-08-21 NOTE — Progress Notes (Signed)
Patient a&o, pain controlled. Dressing dry and intact on right foot. Continue to monitor.

## 2016-08-21 NOTE — Anesthesia Postprocedure Evaluation (Signed)
Anesthesia Post Note  Patient: Rosey BathWilliam H Boehme  Procedure(s) Performed: Procedure(s) (LRB): RAY RESECTION-RIGHT 5TH RAY (Right)  Patient location during evaluation: PACU Anesthesia Type: General Level of consciousness: awake and alert and oriented Pain management: pain level controlled Vital Signs Assessment: post-procedure vital signs reviewed and stable Respiratory status: spontaneous breathing, nonlabored ventilation and respiratory function stable Cardiovascular status: blood pressure returned to baseline and stable Postop Assessment: no signs of nausea or vomiting Anesthetic complications: no     Last Vitals:  Vitals:   08/21/16 1155 08/21/16 1227  BP: 140/63 138/82  Pulse: (!) 48 (!) 50  Resp: 18   Temp: 36.6 C 36.6 C  SpO2: 95% 98%    Last Pain:  Vitals:   08/21/16 1227  TempSrc: Oral  PainSc:                  Allyn Bertoni

## 2016-08-21 NOTE — H&P (View-Only) (Signed)
Reason for Consult: Gangrenous ulceration right foot Referring Physician: [name] is an 71 y.o. male.  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 He has been managed outpatient but recently has become worse with some boggy necrotic tissue. Decision was made for hospitalization with debridement and ray resection.  Past Medical History:  Diagnosis Date  . CAD (coronary artery disease)   . Colon polyp   . Diabetes mellitus without complication (HCC)   . Hyperlipemia   . Hypertension   . Orchitis   . PVD (peripheral vascular disease) (HCC)   . Tremor     Past Surgical History:  Procedure Laterality Date  . AMPUTATION TOE Left    big  . CAROTID ENDARTERECTOMY Right   . COLON SURGERY    . CORONARY ARTERY BYPASS GRAFT    . ROTATOR CUFF REPAIR Right     Family History  Problem Relation Age of Onset  . Diabetes Mother   . Diabetes Father     Social History:  reports that he has never smoked. He does not have any smokeless tobacco history on file. He reports that he does not drink alcohol or use drugs.  Allergies: No Known Allergies  Medications:  Scheduled: . aspirin EC  81 mg Oral Daily  . [START ON 08/21/2016] glimepiride  4 mg Oral Q breakfast  . heparin  5,000 Units Subcutaneous Q8H  . insulin aspart  0-9 Units Subcutaneous TID WC  . multivitamin with minerals  1 tablet Oral Daily  . pravastatin  40 mg Oral Daily  . propranolol  60 mg Oral TID  . ramipril  10 mg Oral Daily    No results found for this or any previous visit (from the past 48 hour(s)).  No results found.  Review of Systems  Constitutional: Negative for chills and weight loss.  HENT: Negative.   Eyes: Negative.   Respiratory: Negative.   Cardiovascular: Negative.   Gastrointestinal: Negative for nausea and vomiting.  Genitourinary: Negative.   Musculoskeletal: Negative.   Skin:       Chronic draining ulcer on the side  of his right foot.  Neurological:       Significant numbness in both of his feet from diabetic neuropathy.  Endo/Heme/Allergies: Negative.   Psychiatric/Behavioral: Negative.    Blood pressure (!) 157/83, pulse 73, temperature 98.3 F (36.8 C), temperature source Oral, resp. rate 20, height 5' 9.02" (1.753 m), weight 97.5 kg (214 lb 15.2 oz), SpO2 98 %. Physical Exam  Cardiovascular:  DP and PT pulses are diminished but palpable.  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 Stiff range of motion. Muscle testing deferred.  Neurological:  Loss of protective threshold monofilament wire in the foot and toes bilateral.  Skin:  Necrotic and gangrenous ulceration is noted over the lateral aspect of the right fifth metatarsal and fifth toe. Some cyanotic discolorations are noted in the plantar aspect of the toe. Also an eschar with ulceration on the plantar aspect of the fifth metatarsal. Moderate drainage and expressible purulence is noted. Some cellulitis and edema is noted in the right foot with an asendind streak anteriorly.    Assessment/Plan: Assessment: 1. Ulceration with gangrenous changes left forefoot and fifth toe. 2. Diabetes with associated neuropathy.  Plan: Discussed with the patient the need for debridement of the infected soft tissue as well as amputation of the fifth toe with debridement of some of the  fifth metatarsal. Discussed risks and benefits of the procedure including inability to heal due to his diabetes or continued infection. Questions were invited. Patient elects to proceed with the surgery which will be scheduled for tomorrow morning. Consent form for fifth ray resection right foot. Nothing by mouth after midnight. Plan for surgery tomorrow morning  Jason [name] 08/20/2016, 1:37 PM

## 2016-08-21 NOTE — Anesthesia Preprocedure Evaluation (Signed)
Anesthesia Evaluation  Patient identified by MRN, date of birth, ID band Patient awake    Reviewed: Allergy & Precautions, NPO status , Patient's Chart, lab work & pertinent test results  History of Anesthesia Complications Negative for: history of anesthetic complications  Airway Mallampati: III  TM Distance: >3 FB Neck ROM: Full    Dental  (+) Edentulous Upper, Missing   Pulmonary neg sleep apnea, neg COPD,    breath sounds clear to auscultation- rhonchi (-) wheezing      Cardiovascular hypertension, Pt. on medications + CAD and + Peripheral Vascular Disease  (-) Past MI, (-) Cardiac Stents and (-) CABG  Rhythm:Regular Rate:Normal - Systolic murmurs and - Diastolic murmurs    Neuro/Psych negative neurological ROS  negative psych ROS   GI/Hepatic negative GI ROS, Neg liver ROS,   Endo/Other  diabetes, Insulin Dependent, Oral Hypoglycemic Agents  Renal/GU negative Renal ROS     Musculoskeletal negative musculoskeletal ROS (+)   Abdominal (+) + obese,   Peds  Hematology negative hematology ROS (+)   Anesthesia Other Findings Past Medical History: No date: CAD (coronary artery disease) No date: Colon polyp No date: Diabetes mellitus without complication (HCC) No date: Hyperlipemia No date: Hypertension No date: Orchitis No date: PVD (peripheral vascular disease) (HCC) No date: Tremor   Reproductive/Obstetrics                             Anesthesia Physical Anesthesia Plan  ASA: III  Anesthesia Plan: General   Post-op Pain Management:    Induction: Intravenous  PONV Risk Score and Plan: 1 and Ondansetron and Propofol infusion  Airway Management Planned: Natural Airway  Additional Equipment:   Intra-op Plan:   Post-operative Plan:   Informed Consent: I have reviewed the patients History and Physical, chart, labs and discussed the procedure including the risks, benefits  and alternatives for the proposed anesthesia with the patient or authorized representative who has indicated his/her understanding and acceptance.   Dental advisory given  Plan Discussed with: CRNA and Anesthesiologist  Anesthesia Plan Comments:         Anesthesia Quick Evaluation

## 2016-08-21 NOTE — Op Note (Signed)
Date of operation: 08/21/2016.  Surgeon: Durward Fortes DPM.  Preoperative diagnosis: Full-thickness ulceration right foot with gangrenous changes.  Postoperative diagnosis: Same with suspected osteomyelitis.  Procedure: Right fifth ray resection.  Anesthesia: Local Mac.  Hemostasis: Pneumatic tourniquet right ankle 250 mmHg.  Estimated blood loss: 5 cc.  Pathology: Right fifth ray.  Cultures: Bone cultures right fifth toe.  Implants: Stimulan rapid cure antibiotic beads impregnated with vancomycin.  Complications: None apparent.  Operative indications: This is a 71 year old male with recent history of ulceration on his right foot which has continued to progress with gangrenous changes. Decision was made for hospitalization and surgical debridement.  Operative procedure: Patient was taken to the operating room and placed on the table in the supine position. Following satisfactory sedation the right foot was anesthetized with 10 cc of 0.5% lidocaine plain. A pneumatic tourniquet applied at the level of the right ankle and the foot was prepped and draped in the usual sterile fashion. Foot was exsanguinated and the tourniquet inflated to 250 mmHg.     Attention was then directed to the lateral aspect of the right foot where an incision was made along the lateral aspect of the fifth metatarsal across the dorsum of the fifth toe as well as plantarly to excise the open ulceration. Dissection was carried down to the level of the bone where dissection was carried back along the base of the proximal phalanx and fifth metatarsal to release the soft tissues. Using a sagittal saw the distal aspect of the fifth metatarsal was transected and the fifth ray was removed in toto. Some devitalized tissues were noted at the inferior aspect of the wound and around the edges. This was sharply excised using a 15 blade and then the remaining edges were debrided with a versa jet on a setting of 4 and 2. Wound was  then thoroughly irrigated with the versa jet on a setting of 2. The proximal incision was then partially closed and the Stimulan rapid cure antibiotic beads implanted into the wound. The remainder of the wound was then closed using 3-0 nylon simple interrupted sutures. Xeroform, 4 x 4's, ABDs, Kerlix were applied to the right foot. Tourniquet was released. Kerlix and an Ace wrap were then applied for additional compression. Patient tolerated the procedure and anesthesia well and was transported to the PACU with vital signs stable and in good condition.

## 2016-08-21 NOTE — OR Nursing (Signed)
FINGER STICK 215 at 1115

## 2016-08-21 NOTE — Progress Notes (Signed)
[employer] - [place] at [facility]   PATIENT NAME: Jason [name]    MR#:  [id number]  DATE OF BIRTH:  Jun 04, 1945  SUBJECTIVE:  CHIEF COMPLAINT:  No chief complaint on file.     REVIEW OF SYSTEMS:   CONSTITUTIONAL: No fever, fatigue or weakness.  EYES: No blurred or double vision.  EARS, NOSE, AND THROAT: No tinnitus or ear pain.  RESPIRATORY: No cough, shortness of breath, wheezing or hemoptysis.  CARDIOVASCULAR: No chest pain, orthopnea, edema.  GASTROINTESTINAL: No nausea, vomiting, diarrhea or abdominal pain.  GENITOURINARY: No dysuria, hematuria.  ENDOCRINE: No polyuria, nocturia,  HEMATOLOGY: No anemia, easy bruising or bleeding SKIN: No rash or lesion. MUSCULOSKELETAL: No joint pain or arthritis.  Right foot lateral side redness and some pain NEUROLOGIC: No tingling, numbness, weakness.  PSYCHIATRY: No anxiety or depression.   ROS  DRUG ALLERGIES:  No Known Allergies  VITALS:  Blood pressure 138/82, pulse (!) 50, temperature 97.8 F (36.6 C), temperature source Oral, resp. rate 18, height 5' 9.02" (1.753 m), weight 97.5 kg (214 lb 15.2 oz), SpO2 98 %.  PHYSICAL EXAMINATION:   GENERAL:  71 y.o.-year-old patient lying in the bed with no acute distress.  EYES: Pupils equal, round, reactive to light and accommodation. No scleral icterus. Extraocular muscles intact.  HEENT: Head atraumatic, normocephalic. Oropharynx and nasopharynx clear.  NECK:  Supple, no jugular venous distention. No thyroid enlargement, no tenderness.  LUNGS: Normal breath sounds bilaterally, no wheezing, rales,rhonchi or crepitation. No use of accessory muscles of respiration.  CARDIOVASCULAR: S1, S2 normal. No murmurs, rubs, or gallops.  ABDOMEN: Soft, nontender, nondistended. Bowel sounds present. No organomegaly or mass.  EXTREMITIES: No pedal edema, cyanosis, or clubbing. Left great toe amputation. Right side 4 to have redness and tenderness in the lateral half with gangrenous  changes on the fifth toe. NEUROLOGIC: Cranial nerves II through XII are intact. Muscle strength 5/5 in all extremities. Sensation intact. Gait not checked.  PSYCHIATRIC: The patient is alert and oriented x 3.  SKIN: No obvious rash, lesion, or ulcer.   Physical Exam LABORATORY PANEL:   CBC  Recent Labs Lab 08/21/16 0436  WBC 7.2  HGB 12.7*  HCT 36.9*  PLT 186   ------------------------------------------------------------------------------------------------------------------  Chemistries   Recent Labs Lab 08/21/16 0436  NA 139  K 4.0  CL 105  CO2 27  GLUCOSE 196*  BUN 19  CREATININE 0.83  CALCIUM 9.0   ------------------------------------------------------------------------------------------------------------------  Cardiac Enzymes No results for input(s): TROPONINI in the last 168 hours. ------------------------------------------------------------------------------------------------------------------  RADIOLOGY:  No results found.  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   X-ray does not show any signs of osteomyelitis.   Podiatry was consulted and plan is for amputation on the toe today.  * Diabetes   hold oral medication and keep on sliding scale coverage.   Give long acting insuline today.  * History of coronary artery disease   Continue aspirin, ramipril, pravastatin. We'll hold Plavix because of surgery.  * Hyperlipidemia   Continue statin.     All the records are reviewed and case discussed with Care Management/Social Workerr. Management plans discussed with the patient, family and they are in agreement.  CODE STATUS: full  TOTAL TIME TAKING CARE OF THIS PATIENT: 35 minutes.     POSSIBLE D/C IN 1-2  DAYS, DEPENDING ON CLINICAL CONDITION.   Altamese DillingVACHHANI, Tineshia Becraft M.D on  08/21/2016   Between 7am to 6pm - Pager - [phone]  After 6pm go to [email or web address] - password [facility] [email or web address]  [facility] [place] [facility]  Office  [phone]  CC: Primary care physician; Marina GoodellFeldpausch, Dale E, MD  Note: This dictation was prepared with Dragon dictation along with smaller phrase technology. Any transcriptional errors that result from this process are unintentional.

## 2016-08-21 NOTE — Progress Notes (Signed)
BP elevated. Vachhani paged. Orders placed.

## 2016-08-21 NOTE — Anesthesia Post-op Follow-up Note (Signed)
Anesthesia QCDR form completed.        

## 2016-08-21 NOTE — Interval H&P Note (Signed)
History and Physical Interval Note:  08/21/2016 9:40 AM  Jason [name]  has presented today for surgery, with the diagnosis of n/a  The various methods of treatment have been discussed with the patient and family. After consideration of risks, benefits and other options for treatment, the patient has consented to  Procedure(s): RAY RESECTION-RIGHT 5TH RAY (Right) as a surgical intervention .  The patient's history has been reviewed, patient examined, no change in status, stable for surgery.  I have reviewed the patient's chart and labs.  Questions were answered to the patient's satisfaction.     [name] W Antron Seth

## 2016-08-22 LAB — GLUCOSE, CAPILLARY
GLUCOSE-CAPILLARY: 208 mg/dL — AB (ref 65–99)
GLUCOSE-CAPILLARY: 244 mg/dL — AB (ref 65–99)
Glucose-Capillary: 207 mg/dL — ABNORMAL HIGH (ref 65–99)
Glucose-Capillary: 191 mg/dL — ABNORMAL HIGH (ref 65–99)

## 2016-08-22 LAB — VANCOMYCIN, TROUGH: Vancomycin Tr: 12 ug/mL — ABNORMAL LOW (ref 15–20)

## 2016-08-22 MED ORDER — INSULIN GLARGINE 100 UNIT/ML ~~LOC~~ SOLN
12.0000 [IU] | Freq: Every day | SUBCUTANEOUS | Status: DC
  Administered 2016-08-23: 12 [IU] via SUBCUTANEOUS
  Filled 2016-08-22 (×2): qty 0.12

## 2016-08-22 NOTE — Progress Notes (Signed)
[employer] [facility] [facility] [place] [facility]   PATIENT NAME: Jason [name]    MR#:  [id number]  DATE OF BIRTH:  11/13/1945  SUBJECTIVE:  CHIEF COMPLAINT:  No chief complaint on file. s/p surgery, feels good, no much pain.    REVIEW OF SYSTEMS:   CONSTITUTIONAL: No fever, fatigue or weakness.  EYES: No blurred or double vision.  EARS, NOSE, AND THROAT: No tinnitus or ear pain.  RESPIRATORY: No cough, shortness of breath, wheezing or hemoptysis.  CARDIOVASCULAR: No chest pain, orthopnea, edema.  GASTROINTESTINAL: No nausea, vomiting, diarrhea or abdominal pain.  GENITOURINARY: No dysuria, hematuria.  ENDOCRINE: No polyuria, nocturia,  HEMATOLOGY: No anemia, easy bruising or bleeding SKIN: No rash or lesion. MUSCULOSKELETAL: No joint pain or arthritis.  Right foot lateral side redness and some pain NEUROLOGIC: No tingling, numbness, weakness.  PSYCHIATRY: No anxiety or depression.   ROS  DRUG ALLERGIES:  No Known Allergies  VITALS:  Blood pressure (!) 162/83, pulse (!) 55, temperature 97.7 F (36.5 C), temperature source Oral, resp. rate 18, height 5' 9.02" (1.753 m), weight 97.5 kg (214 lb 15.2 oz), SpO2 97 %.  PHYSICAL EXAMINATION:   GENERAL:  71 y.o.-year-old patient lying in the bed with no acute distress.  EYES: Pupils equal, round, reactive to light and accommodation. No scleral icterus. Extraocular muscles intact.  HEENT: Head atraumatic, normocephalic. Oropharynx and nasopharynx clear.  NECK:  Supple, no jugular venous distention. No thyroid enlargement, no tenderness.  LUNGS: Normal breath sounds bilaterally, no wheezing, rales,rhonchi or crepitation. No use of accessory muscles of respiration.  CARDIOVASCULAR: S1, S2 normal. No murmurs, rubs, or gallops.  ABDOMEN: Soft, nontender, nondistended. Bowel sounds present. No organomegaly or mass.  EXTREMITIES: No pedal edema, cyanosis, or clubbing. Left great toe amputation. dressing present after amputation on  right foot. NEUROLOGIC: Cranial nerves II through XII are intact. Muscle strength 5/5 in all extremities. Sensation intact. Gait not checked.  PSYCHIATRIC: The patient is alert and oriented x 3.  SKIN: No obvious rash, lesion, or ulcer.   Physical Exam LABORATORY PANEL:   CBC  Recent Labs Lab 08/21/16 0436  WBC 7.2  HGB 12.7*  HCT 36.9*  PLT 186   ------------------------------------------------------------------------------------------------------------------  Chemistries   Recent Labs Lab 08/21/16 0436  NA 139  K 4.0  CL 105  CO2 27  GLUCOSE 196*  BUN 19  CREATININE 0.83  CALCIUM 9.0   ------------------------------------------------------------------------------------------------------------------  Cardiac Enzymes No results for input(s): TROPONINI in the last 168 hours. ------------------------------------------------------------------------------------------------------------------  RADIOLOGY:  No results found.  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   X-ray does not show any signs of osteomyelitis.   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  As per podiatry monitor for 2 more days while waiting for culture results and checking for signs of improvement on follow-up dressings.  * Diabetes   hold oral medication and keep on sliding scale coverage.   Give long acting insuline today.   We will increase it to 12 units a day.  * History of coronary artery disease   Continue aspirin, ramipril, pravastatin. We'll hold Plavix because of surgery.  * Hyperlipidemia   Continue statin.     All the records are reviewed and case discussed with Care Management/Social Workerr. Management plans discussed  with the patient, family and they are in agreement.  CODE  STATUS: full  TOTAL TIME TAKING CARE OF THIS PATIENT: 35 minutes.     POSSIBLE D/C IN 1-2 DAYS, DEPENDING ON CLINICAL CONDITION.   Altamese Dilling M.D on 08/22/2016   Between 7am to 6pm - Pager - [phone]  After 6pm go to [email or web address] - password [employer]  [facility]  Office  [phone]  CC: Primary care physician; Marina Goodell, MD  Note: This dictation was prepared with Dragon dictation along with smaller phrase technology. Any transcriptional errors that result from this process are unintentional.

## 2016-08-22 NOTE — Progress Notes (Signed)
Patient a&o, VSS. No complaints of pain. Dressing dry and intact. Continue to monitor.

## 2016-08-22 NOTE — Progress Notes (Signed)
1 Day Post-Op   Subjective/Chief Complaint: Patient seen. No complaints of pain.   Objective: Vital signs in last 24 hours: Temp:  [97.7 F (36.5 C)-98.9 F (37.2 C)] 97.7 F (36.5 C) (08/12 0716) Pulse Rate:  [48-60] 55 (08/12 0716) Resp:  [17-18] 18 (08/12 0716) BP: (102-170)/(51-83) 162/83 (08/12 0716) SpO2:  [95 %-99 %] 97 % (08/12 0716) Last BM Date: 08/20/16  Intake/Output from previous day: 08/11 0701 - 08/12 0700 In: 3040 [P.O.:240; I.V.:2000; IV Piggyback:800] Out: 5 [Blood:5] Intake/Output this shift: Total I/O In: 240 [P.O.:240] Out: -   Bandages dry and intact with no strikethrough. Upon removal there is some moderate to heavy bleeding noted on the bandaging. The incision is well coapted with significant reduction in erythema and edema. The dorsal flap from the fifth toe does appear to be mildly dusky.  Lab Results:   Recent Labs  08/20/16 1354 08/21/16 0436  WBC 9.7 7.2  HGB 13.0 12.7*  HCT 37.0* 36.9*  PLT 173 186   BMET  Recent Labs  08/20/16 1354 08/21/16 0436  NA  --  139  K  --  4.0  CL  --  105  CO2  --  27  GLUCOSE  --  196*  BUN  --  19  CREATININE 1.08 0.83  CALCIUM  --  9.0   PT/INR No results for input(s): LABPROT, INR in the last 72 hours. ABG No results for input(s): PHART, HCO3 in the last 72 hours.  Invalid input(s): PCO2, PO2  Studies/Results: No results found.  Anti-infectives: Anti-infectives    Start     Dose/Rate Route Frequency Ordered Stop   08/20/16 2200  ceFEPIme (MAXIPIME) 1 g in dextrose 5 % 50 mL IVPB     1 g 100 mL/hr over 30 Minutes Intravenous Every 8 hours 08/20/16 1447     08/20/16 2100  vancomycin (VANCOCIN) IVPB 1000 mg/200 mL premix  Status:  Discontinued     1,000 mg 200 mL/hr over 60 Minutes Intravenous Every 12 hours 08/20/16 1447 08/22/16 0708   08/20/16 1315  ceFEPIme (MAXIPIME) 2 g in dextrose 5 % 50 mL IVPB     2 g 100 mL/hr over 30 Minutes Intravenous  Once 08/20/16 1301 08/20/16 1501    08/20/16 1315  vancomycin (VANCOCIN) IVPB 1000 mg/200 mL premix     1,000 mg 200 mL/hr over 60 Minutes Intravenous  Once 08/20/16 1301 08/20/16 1608      Assessment/Plan: s/p Procedure(s): RAY RESECTION-RIGHT 5TH RAY (Right) Assessment: Gangrene status post fifth ray resection   Plan: Betadine and a sterile bandage applied. Discussed with the patient and family we will have to observe for any signs of necrosis along the incision area. Discussed that he will need home health care for dressing changes upon discharge. Plan to follow up with reevaluation of the wound on Tuesday  LOS: 2 days    [name] W Berel Najjar 08/22/2016

## 2016-08-22 NOTE — Care Management Note (Signed)
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  He has a special orthopedic shoe at bedside which he is to use at home. He is requesting a RW for home use to help him maintain his balance. PCP=Dr Feldpausch. Pharmacy= [facility] in [place]. No home oxygen. No home assistive equipment. No home health services. Transportation will be provided by Mr Shew's daughter-in-law and friends. No stairs in the home and only a few steps to get in and out of the residence. Anticipate that home health will be ordered. [facility]eth[facility]Bunn [facility] Case management will follow for discharge planning.          Action/Plan:   Expected Discharge Date:                  Expected Discharge Plan:  Home w Home Health Services  In-House Referral:  NA  Discharge planning Services  NA  Post Acute Care Choice:  Home Health Choice offered to:  Patient  DME Arranged:  Dan HumphreysWalker DME Agency:  [facility]  [facility]  Status of Service:  In process, will continue to follow  If discussed at Long Length of Stay Meetings, dates discussed:    Additional Comments:  Keaton Stirewalt A, RN 08/22/2016, 3:38 PM

## 2016-08-23 ENCOUNTER — Encounter: Payer: Self-pay | Admitting: Podiatry

## 2016-08-23 LAB — GLUCOSE, CAPILLARY
GLUCOSE-CAPILLARY: 228 mg/dL — AB (ref 65–99)
GLUCOSE-CAPILLARY: 293 mg/dL — AB (ref 65–99)
GLUCOSE-CAPILLARY: 219 mg/dL — AB (ref 65–99)
Glucose-Capillary: 202 mg/dL — ABNORMAL HIGH (ref 65–99)

## 2016-08-23 LAB — BASIC METABOLIC PANEL
ANION GAP: 8 (ref 5–15)
BUN: 17 mg/dL (ref 6–20)
CO2: 28 mmol/L (ref 22–32)
Calcium: 9.4 mg/dL (ref 8.9–10.3)
Chloride: 104 mmol/L (ref 101–111)
Creatinine, Ser: 0.86 mg/dL (ref 0.61–1.24)
GFR calc Af Amer: >60 mL/min (ref >60)
GLUCOSE: 182 mg/dL — AB (ref 65–99)
POTASSIUM: 4.4 mmol/L (ref 3.5–5.1)
Sodium: 140 mmol/L (ref 135–145)

## 2016-08-23 LAB — CBC
HEMATOCRIT: 37.5 % — AB (ref 40.0–52.0)
Hemoglobin: 13.1 g/dL (ref 13.0–18.0)
MCH: 30.4 pg (ref 26.0–34.0)
MCHC: 34.9 g/dL (ref 32.0–36.0)
MCV: 87.2 fL (ref 80.0–100.0)
PLATELETS: 229 10*3/uL (ref 150–440)
RBC: 4.3 MIL/uL — AB (ref 4.40–5.90)
RDW: 12.9 % (ref 11.5–14.5)
WBC: 6.6 10*3/uL (ref 3.8–10.6)

## 2016-08-23 MED ORDER — PROPRANOLOL HCL 20 MG PO TABS
40.0000 mg | ORAL_TABLET | Freq: Three times a day (TID) | ORAL | Status: DC
  Administered 2016-08-23 – 2016-08-24 (×4): 40 mg via ORAL
  Filled 2016-08-23 (×5): qty 2

## 2016-08-23 NOTE — Progress Notes (Addendum)
Inpatient Diabetes Program Recommendations  AACE/ADA: New Consensus Statement on Inpatient Glycemic Control (2015)  Target Ranges:  Prepandial:   less than 140 mg/dL      Peak postprandial:   less than 180 mg/dL (1-2 hours)      Critically ill patients:  140 - 180 mg/dL   Results for Jason [name], Jason [name] (MRN [id number]) as of 08/23/2016 10:51  Ref. Range 08/22/2016 07:35 08/22/2016 11:19 08/22/2016 16:05 08/22/2016 22:15  Glucose-Capillary Latest Ref Range: 65 - 99 mg/dL 132207 ([name]) 440208 ([name]) 102244 ([name]) 191 ([name])   Results for Jason [name], [name] (MRN [id number]) as of 08/23/2016 10:51  Ref. Range 08/23/2016 07:23  Glucose-Capillary Latest Ref Range: 65 - 99 mg/dL 347202 ([name])    Home DM Meds: Lantus 9 units QHS + Regular Insulin 5-10 units TID + Metformin 1000 mg BID + Amaryl 4 mg BID  Current Orders: Lantus 12 units daily      Novolog Sensitive Correction Scale/ SSI (0-9 units) TID AC      MD- Note Lantus increased this AM to 12 units daily.  Patient eating 100% of meals.  Please also consider starting Novolog Meal Coverage as well: Novolog 4 units TID with meals to start (hold if pt eats <50% of meal)      --Will follow patient during hospitalization--  Ambrose FinlandJeannine Johnston Etty Isaac RN, MSN, CDE Diabetes Coordinator Inpatient Glycemic Control Team Team Pager: [phone] (8a-5p)

## 2016-08-23 NOTE — Care Management Important Message (Signed)
Important Message  Patient Details  Name: Jason [name] MRN: [id number] Date of Birth: 01/03/1946   Medicare Important Message Given:  Yes    Collie SiadAngela Shameka Aggarwal, RN 08/23/2016, 8:59 AM

## 2016-08-23 NOTE — Progress Notes (Signed)
2 Days Post-Op   Subjective/Chief Complaint: Patient seen. Overall comfortable in doing well   Objective: Vital signs in last 24 hours: Temp:  [97.5 F (36.4 C)-98.3 F (36.8 C)] 98.3 F (36.8 C) (08/13 0026) Pulse Rate:  [56-66] 66 (08/13 0904) Resp:  [18] 18 (08/13 0026) BP: (137-154)/(59-99) 154/59 (08/13 0904) SpO2:  [98 %-100 %] 98 % (08/13 0904) Last BM Date:  (08/20/2016)  Intake/Output from previous day: 08/12 0701 - 08/13 0700 In: 870 [P.O.:720; IV Piggyback:150] Out: 0  Intake/Output this shift: Total I/O In: 240 [P.O.:240] Out: -   Bandage on the right foot is dry and intact. No evidence of any strikethrough.  Lab Results:   Recent Labs  08/21/16 0436 08/23/16 0409  WBC 7.2 6.6  HGB 12.7* 13.1  HCT 36.9* 37.5*  PLT 186 229   BMET  Recent Labs  08/21/16 0436 08/23/16 0409  NA 139 140  K 4.0 4.4  CL 105 104  CO2 27 28  GLUCOSE 196* 182*  BUN 19 17  CREATININE 0.83 0.86  CALCIUM 9.0 9.4   PT/INR No results for input(s): LABPROT, INR in the last 72 hours. ABG No results for input(s): PHART, HCO3 in the last 72 hours.  Invalid input(s): PCO2, PO2  Studies/Results: No results found.  Anti-infectives: Anti-infectives    Start     Dose/Rate Route Frequency Ordered Stop   08/20/16 2200  ceFEPIme (MAXIPIME) 1 g in dextrose 5 % 50 mL IVPB     1 g 100 mL/hr over 30 Minutes Intravenous Every 8 hours 08/20/16 1447     08/20/16 2100  vancomycin (VANCOCIN) IVPB 1000 mg/200 mL premix  Status:  Discontinued     1,000 mg 200 mL/hr over 60 Minutes Intravenous Every 12 hours 08/20/16 1447 08/22/16 0708   08/20/16 1315  ceFEPIme (MAXIPIME) 2 g in dextrose 5 % 50 mL IVPB     2 g 100 mL/hr over 30 Minutes Intravenous  Once 08/20/16 1301 08/20/16 1501   08/20/16 1315  vancomycin (VANCOCIN) IVPB 1000 mg/200 mL premix     1,000 mg 200 mL/hr over 60 Minutes Intravenous  Once 08/20/16 1301 08/20/16 1608      Assessment/Plan: s/p Procedure(s): RAY  RESECTION-RIGHT 5TH RAY (Right) Assessment: Stable status post ray resection right foot   Plan: Dressing left intact today. Waiting on culture results. Plan for discharge pending results of his cultures with home health care for dressing changes 3 times a week. Follow-up in 1 week for reevaluation  LOS: 3 days    Jason Barkerodd W Keaden [name] 08/23/2016

## 2016-08-23 NOTE — Progress Notes (Signed)
[employer] [facility] [place] [facility]   PATIENT NAME: Jason MansWilliam Vandiver    MR#:  [id number]  DATE OF BIRTH:  10/14/1945  SUBJECTIVE:  CHIEF COMPLAINT:  No chief complaint on file. s/p surgery, feels good, no much pain.    REVIEW OF SYSTEMS:   CONSTITUTIONAL: No fever, fatigue or weakness.  EYES: No blurred or double vision.  EARS, NOSE, AND THROAT: No tinnitus or ear pain.  RESPIRATORY: No cough, shortness of breath, wheezing or hemoptysis.  CARDIOVASCULAR: No chest pain, orthopnea, edema.  GASTROINTESTINAL: No nausea, vomiting, diarrhea or abdominal pain.  GENITOURINARY: No dysuria, hematuria.  ENDOCRINE: No polyuria, nocturia,  HEMATOLOGY: No anemia, easy bruising or bleeding SKIN: No rash or lesion. MUSCULOSKELETAL: No joint pain or arthritis.  Right foot lateral side redness and some pain NEUROLOGIC: No tingling, numbness, weakness.  PSYCHIATRY: No anxiety or depression.   ROS  DRUG ALLERGIES:  No Known Allergies  VITALS:  Blood pressure (!) 111/51, pulse (!) 52, temperature 98.2 F (36.8 C), temperature source Oral, resp. rate 20, height 5' 9.02" (1.753 m), weight 97.5 kg (214 lb 15.2 oz), SpO2 98 %.  PHYSICAL EXAMINATION:   GENERAL:  71 y.o.-year-old patient lying in the bed with no acute distress.  EYES: Pupils equal, round, reactive to light and accommodation. No scleral icterus. Extraocular muscles intact.  HEENT: Head atraumatic, normocephalic. Oropharynx and nasopharynx clear.  NECK:  Supple, no jugular venous distention. No thyroid enlargement, no tenderness.  LUNGS: Normal breath sounds bilaterally, no wheezing, rales,rhonchi or crepitation. No use of accessory muscles of respiration.  CARDIOVASCULAR: S1, S2 normal. No murmurs, rubs, or gallops.  ABDOMEN: Soft, nontender, nondistended. Bowel sounds present. No organomegaly or mass.  EXTREMITIES: No pedal edema, cyanosis, or clubbing. 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 dressing present after amputation on  right foot. NEUROLOGIC: Cranial nerves II through XII are intact. Muscle strength 5/5 in all extremities. Sensation intact. Gait not checked.  PSYCHIATRIC: The patient is alert and oriented x 3.  SKIN: No obvious rash, lesion, or ulcer.   Physical Exam LABORATORY PANEL:   CBC  Recent Labs Lab 08/23/16 0409  WBC 6.6  HGB 13.1  HCT 37.5*  PLT 229   ------------------------------------------------------------------------------------------------------------------  Chemistries   Recent Labs Lab 08/23/16 0409  NA 140  K 4.4  CL 104  CO2 28  GLUCOSE 182*  BUN 17  CREATININE 0.86  CALCIUM 9.4   ------------------------------------------------------------------------------------------------------------------  Cardiac Enzymes No results for input(s): TROPONINI in the last 168 hours. ------------------------------------------------------------------------------------------------------------------  RADIOLOGY:  No results found.  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   X-ray does not show any signs of osteomyelitis.   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  As per podiatry monitor for 1 more days while waiting for culture results and checking for signs of improvement on follow-up dressings.  Pt started walking with a special boot.  * Diabetes   hold oral medication and keep on sliding scale coverage.   Give long acting insuline today.   We will increase it to 12 units a day.  * History of coronary artery disease   Continue aspirin, ramipril, pravastatin. We'll hold Plavix because of surgery.  * Hyperlipidemia   Continue statin.    All the records are reviewed  and case discussed with Care Management/Social Workerr. Management plans discussed with  the patient, family and they are in agreement.  CODE STATUS: full  TOTAL TIME TAKING CARE OF THIS PATIENT: 35 minutes.    POSSIBLE D/C IN 1-2 DAYS, DEPENDING ON CLINICAL CONDITION.   Altamese Dilling M.D on 08/23/2016   Between 7am to 6pm - Pager - [phone]  After 6pm go to [email or web address] - password [email or web address]  [facility]  Office  [phone]  CC: Primary care physician; Marina Goodell, MD  Note: This dictation was prepared with Dragon dictation along with smaller phrase technology. Any transcriptional errors that result from this process are unintentional.

## 2016-08-23 NOTE — Care Management Note (Signed)
Case Management Note  Patient Details  Name: Jason [name] MRN: [id number] Date of Birth: 1945/09/29  Subjective/Objective:                   Met with patient to discuss discharge planning. Patient is from home and has support provided by step-daughter. He states his PCP is Dr. Ellison Hughs. He states he will need a rolling walker prior to discharge. Action/Plan:   Referral to [facility] for [facility]er and potentially home antibiotics. RNCM will continue to follow.   Expected Discharge Date:                  Expected Discharge Plan:  Camp[facility]  In-House Referral:     Discharge planning Services  CM Consult  Post Acute Care Choice:  Home Health, Durable Medical Equipment Choice offered to:  Patient  DME Arranged:  Gilford Rile, IV pump/equipment DME Agency:  [place]:    [place]:  [place]  Status of Service:  In process, will continue to follow  If discussed at Long Length of Stay Meetings, dates discussed:    Additional Comments:  Marshell Garfinkel, RN 08/23/2016, 10:33 AM

## 2016-08-24 LAB — GLUCOSE, CAPILLARY
GLUCOSE-CAPILLARY: 234 mg/dL — AB (ref 65–99)
Glucose-Capillary: 266 mg/dL — ABNORMAL HIGH (ref 65–99)
Glucose-Capillary: 206 mg/dL — ABNORMAL HIGH (ref 65–99)

## 2016-08-24 LAB — SURGICAL PATHOLOGY

## 2016-08-24 MED ORDER — OXYCODONE-ACETAMINOPHEN 5-325 MG PO TABS: 1.0000 | ORAL_TABLET | Freq: Four times a day (QID) | ORAL | 0 refills | Status: DC | PRN

## 2016-08-24 MED ORDER — POVIDONE-IODINE 10 % EX SOLN: CUTANEOUS | 0 refills | Status: DC

## 2016-08-24 MED ORDER — PROPRANOLOL HCL 40 MG PO TABS: 40.0000 mg | ORAL_TABLET | Freq: Three times a day (TID) | ORAL | 0 refills | Status: DC

## 2016-08-24 MED ORDER — CEPHALEXIN 250 MG PO CAPS: 250.0000 mg | ORAL_CAPSULE | Freq: Three times a day (TID) | ORAL | 0 refills | Status: AC

## 2016-08-24 MED ORDER — INSULIN GLARGINE 100 UNIT/ML ~~LOC~~ SOLN
15.0000 [IU] | Freq: Every day | SUBCUTANEOUS | Status: DC
  Administered 2016-08-24: 15 [IU] via SUBCUTANEOUS
  Filled 2016-08-24 (×2): qty 0.15

## 2016-08-24 MED ORDER — ENOXAPARIN SODIUM 40 MG/0.4ML ~~LOC~~ SOLN: 40.0000 mg | SUBCUTANEOUS | Status: DC

## 2016-08-24 MED ORDER — DOCUSATE SODIUM 100 MG PO CAPS: 100.0000 mg | ORAL_CAPSULE | Freq: Two times a day (BID) | ORAL | 0 refills | Status: DC | PRN

## 2016-08-24 NOTE — Progress Notes (Signed)
3 Days Post-Op   Subjective/Chief Complaint: Patient seen. Doing well with no pain.   Objective: Vital signs in last 24 hours: Temp:  [97.8 F (36.6 C)-98.2 F (36.8 C)] 98.1 F (36.7 C) (08/14 0738) Pulse Rate:  [52-63] 63 (08/14 0738) Resp:  [17-20] 17 (08/14 0400) BP: (111-150)/(51-70) 128/65 (08/14 0738) SpO2:  [95 %-98 %] 97 % (08/14 0738) Last BM Date: 08/23/16  Intake/Output from previous day: 08/13 0701 - 08/14 0700 In: 720 [P.O.:720] Out: -  Intake/Output this shift: Total I/O In: 240 [P.O.:240] Out: -   Moderate bleeding on the bandage. Some minimal drainage. Some maceration along the incision line but this is well coapted. Some continued necrosis distally at the area where the toe was flapped down. Cellulitis improved.  Lab Results:   Recent Labs  08/23/16 0409  WBC 6.6  HGB 13.1  HCT 37.5*  PLT 229   BMET  Recent Labs  08/23/16 0409  NA 140  K 4.4  CL 104  CO2 28  GLUCOSE 182*  BUN 17  CREATININE 0.86  CALCIUM 9.4   PT/INR No results for input(s): LABPROT, INR in the last 72 hours. ABG No results for input(s): PHART, HCO3 in the last 72 hours.  Invalid input(s): PCO2, PO2  Studies/Results: No results found.  Anti-infectives: Anti-infectives    Start     Dose/Rate Route Frequency Ordered Stop   08/20/16 2200  ceFEPIme (MAXIPIME) 1 g in dextrose 5 % 50 mL IVPB     1 g 100 mL/hr over 30 Minutes Intravenous Every 8 hours 08/20/16 1447     08/20/16 2100  vancomycin (VANCOCIN) IVPB 1000 mg/200 mL premix  Status:  Discontinued     1,000 mg 200 mL/hr over 60 Minutes Intravenous Every 12 hours 08/20/16 1447 08/22/16 0708   08/20/16 1315  ceFEPIme (MAXIPIME) 2 g in dextrose 5 % 50 mL IVPB     2 g 100 mL/hr over 30 Minutes Intravenous  Once 08/20/16 1301 08/20/16 1501   08/20/16 1315  vancomycin (VANCOCIN) IVPB 1000 mg/200 mL premix     1,000 mg 200 mL/hr over 60 Minutes Intravenous  Once 08/20/16 1301 08/20/16 1608       Assessment/Plan: s/p Procedure(s): RAY RESECTION-RIGHT 5TH RAY (Right) Assessment: Stable status post fifth ray resection.   Plan: Betadine and a sterile bandage applied. Discussed with the patient that the area at the end of the incision may necrosis but we will just allow this to happen on its own. Should be stable for discharge pending his culture results which should be in later today. We'll plan for home health care for dressing change twice weekly. Follow-up outpatient next week.  LOS: 4 days    [name] W Tava Peery 08/24/2016

## 2016-08-24 NOTE — Progress Notes (Signed)
Pharmacy Antibiotic Note  Jason [name] is a 71 y.o. male admitted on 08/20/2016 with wound infection.  Pharmacy has been consulted for cefepime and vancomycin dosing.  Vancomycin discontinued 08/22/16. Toe amputation 08/21/16  Plan: Day 5-  cefepime 1 gm IV Q8H.  F/u Wound cx results.    Height: 5' 9.02" (175.3 cm) Weight: 214 lb 15.2 oz (97.5 kg) IBW/kg (Calculated) : 70.74  Temp (24hrs), Avg:98.1 F (36.7 C), Min:97.8 F (36.6 C), Max:98.2 F (36.8 C)   Recent Labs Lab 08/20/16 1354 08/21/16 0436 08/22/16 0822 08/23/16 0409  WBC 9.7 7.2  --  6.6  CREATININE 1.08 0.83  --  0.86  VANCOTROUGH  --   --  12*  --     Estimated Creatinine Clearance: 92 mL/min (by C-G formula based on SCr of 0.86 mg/dL).    No Known Allergies  Thank you for allowing pharmacy to be a part of this patient's care.  Jason [name] A, Pharm.D., BCPS Clinical Pharmacist 08/24/2016 8:37 AM

## 2016-08-24 NOTE — Progress Notes (Signed)
Inpatient Diabetes Program Recommendations  AACE/ADA: New Consensus Statement on Inpatient Glycemic Control (2015)  Target Ranges:  Prepandial:   less than 140 mg/dL      Peak postprandial:   less than 180 mg/dL (1-2 hours)      Critically ill patients:  140 - 180 mg/dL   Results for Rosey BathBUNN, Derk H (MRN [id number]) as of 08/24/2016 10:44  Ref. Range 08/23/2016 07:23 08/23/2016 11:00 08/23/2016 16:27 08/23/2016 21:15  Glucose-Capillary Latest Ref Range: 65 - 99 mg/dL 578202 (H) 469228 (H) 629293 (H) 219 (H)   Results for Rosey BathBUNN, Jasmon H (MRN [id number]) as of 08/24/2016 10:44  Ref. Range 08/24/2016 07:44  Glucose-Capillary Latest Ref Range: 65 - 99 mg/dL 010234 (H)    Home DM Meds: Lantus 9 units QHS + Regular Insulin 5-10 units TID + Metformin 1000 mg BID + Amaryl 4 mg BID  Current Orders: Lantus 15 units daily                             Novolog Sensitive Correction Scale/ SSI (0-9 units) TID AC      MD- Note Lantus increased to 15 units daily today.  Patient eating 100% of meals.  Please also consider starting Novolog Meal Coverage as well: Novolog 4 units TID with meals to start (hold if pt eats <50% of meal)      --Will follow patient during hospitalization--  Ambrose FinlandJeannine Johnston Makela Niehoff RN, MSN, CDE Diabetes Coordinator Inpatient Glycemic Control Team Team Pager: [phone] (8a-5p)

## 2016-08-24 NOTE — Care Management (Signed)
Met with patient and he states that he will not be discharged according to MD; pending cultures results. [facility] would like to use [facility]. Corene Cornea with [facility] notified. Rolling walker to be delivered prior to discharge.

## 2016-08-24 NOTE — Progress Notes (Signed)
Patient was discharged home with step daughter. Patient had no complaints or SS of distress. Patient was not expecting to be discharge today.

## 2016-08-25 NOTE — Discharge Summary (Signed)
[facility] [place] [facility]   PATIENT NAME: Jason [name]    MR#:  [id number]  DATE OF BIRTH:  1945/08/17  DATE OF ADMISSION:  08/20/2016 ADMITTING PHYSICIAN: Altamese Dilling, MD  DATE OF DISCHARGE: 08/24/2016  6:01 PM  PRIMARY CARE PHYSICIAN: Marina Goodell, MD    ADMISSION DIAGNOSIS:  Gangrene, Abscess, Cellulitis n/a  DISCHARGE DIAGNOSIS:  Principal Problem:   Gangrene (HCC) Active Problems:   Cellulitis in diabetic foot (HCC)   SECONDARY DIAGNOSIS:   Past Medical History:  Diagnosis Date  . CAD (coronary artery disease)   . Colon polyp   . Diabetes mellitus without complication (HCC)   . Hyperlipemia   . Hypertension   . Orchitis   . PVD (peripheral vascular disease) (HCC)   . Tremor     HOSPITAL COURSE:   * Gangrene with cellulitis right fifth toe- ray resection IV vancomycin and cefepime for now. X-ray does not show any signs of osteomyelitis. 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  received culture results and checking for signs of improvement on follow-up dressings.  Pt started walking with a special boot.  * Diabetes hold oral medication and keep on sliding scale coverage.   Give long acting insuline today.   We will increase it to 12 units a day.  Pt says, at home he checks his blood sugar and manage his insuline per sliding scale.  * History of coronary artery disease Continue aspirin, ramipril, pravastatin. We'll hold Plavix because of surgery.  * Hyperlipidemia Continue statin.   DISCHARGE CONDITIONS:   Stable.  CONSULTS OBTAINED:    DRUG ALLERGIES:  No Known Allergies  DISCHARGE MEDICATIONS:   Discharge Medication List as of 08/24/2016  3:57 PM    START taking these medications   Details  cephALEXin (KEFLEX) 250 MG capsule Take 1 capsule (250 mg total) by mouth 3 (three) times daily., Starting Tue 08/24/2016, Until Tue 08/31/2016,  Print    docusate sodium (COLACE) 100 MG capsule Take 1 capsule (100 mg total) by mouth 2 (two) times daily as needed for mild constipation., Starting Tue 08/24/2016, Normal    oxyCODONE-acetaminophen (PERCOCET/ROXICET) 5-325 MG tablet Take 1 tablet by mouth every 6 (six) hours as needed for severe pain., Starting Tue 08/24/2016, Print    povidone-iodine (BETADINE) 10 % external solution To apply and clean the wound 3 times a week., Print      CONTINUE these medications which have CHANGED   Details  propranolol (INDERAL) 40 MG tablet Take 1 tablet (40 mg total) by mouth 3 (three) times daily., Starting Tue 08/24/2016, Print      CONTINUE these medications which have NOT CHANGED   Details  aspirin 81 MG tablet Take 81 mg by mouth daily., Historical Med    clopidogrel (PLAVIX) 75 MG tablet Take 75 mg by mouth daily., Historical Med    glimepiride (AMARYL) 4 MG tablet Take 4 mg by mouth 2 (two) times daily. , Historical Med    insulin glargine (LANTUS) 100 UNIT/ML injection Inject 9 Units into the skin at bedtime. , Historical Med    insulin regular (NOVOLIN R,HUMULIN R) 100 units/mL injection Inject 5-10 Units into the skin 3 (three) times daily before meals. , Historical Med    Magnesium 250 MG TABS Take by mouth., Historical Med    metFORMIN (GLUCOPHAGE) 1000 MG tablet Take 1,000 mg by mouth 2 (two) times daily with a meal., Historical Med  Multiple Vitamin (MULTIVITAMIN) capsule Take 1 capsule by mouth daily., Historical Med    pravastatin (PRAVACHOL) 40 MG tablet Take 40 mg by mouth daily., Historical Med    ramipril (ALTACE) 10 MG capsule Take 10 mg by mouth daily., Historical Med      STOP taking these medications     amoxicillin-clavulanate (AUGMENTIN) 875-125 MG tablet      doxycycline (DORYX) 100 MG EC tablet      Potassium Gluconate 595 MG CAPS          DISCHARGE INSTRUCTIONS:    Follow with podiarty clinic in 1-2 weeks.  If you experience worsening of  your admission symptoms, develop shortness of breath, life threatening emergency, suicidal or homicidal thoughts you must seek medical attention immediately by calling 911 or calling your MD immediately  if symptoms less severe.  You Must read complete instructions/literature along with all the possible adverse reactions/side effects for all the Medicines you take and that have been prescribed to you. Take any new Medicines after you have completely understood and accept all the possible adverse reactions/side effects.   Please note  You were cared for by a hospitalist during your hospital stay. If you have any questions about your discharge medications or the care you received while you were in the hospital after you are discharged, you can call the unit and asked to speak with the hospitalist on call if the hospitalist that took care of you is not available. Once you are discharged, your primary care physician will handle any further medical issues. Please note that NO REFILLS for any discharge medications will be authorized once you are discharged, as it is imperative that you return to your primary care physician (or establish a relationship with a primary care physician if you do not have one) for your aftercare needs so that they can reassess your need for medications and monitor your lab values.    Today   CHIEF COMPLAINT:  No chief complaint on file.   HISTORY OF PRESENT ILLNESS:  Jason [name]  is a 71 y.o. male with a known history of coronary artery disease, diabetes, hyperlipidemia, hypertension, peripheral vascular disease- started noticing redness and some pain on his right side of the foot especially around his fifth toe. This is getting worse for last 2-3 days and so he went to podiatry clinic today where Dr. Ether GriffinsFowler who felt he may have some gangrene as x-ray on the foot did not show any osteomyelitis but he felt patient may need to have amputation of his fifth toe and so suggested to  go to hospital for further management and called us for direct admission. Patient denies any fever or chills.   VITAL SIGNS:  Blood pressure (!) 119/57, pulse (!) 54, temperature 98 F (36.7 C), temperature source Oral, resp. rate 17, height 5' 9.02" (1.753 m), weight 97.5 kg (214 lb 15.2 oz), SpO2 98 %.  I/O:  No intake or output data in the 24 hours ending 08/25/16 2204  PHYSICAL EXAMINATION:   GENERAL: 71 y.o.-year-old patient lying in the bed with no acute distress.  EYES: Pupils equal, round, reactive to light and accommodation. No scleral icterus. Extraocular muscles intact.  HEENT: Head atraumatic, normocephalic. Oropharynx and nasopharynx clear.  NECK: Supple, no jugular venous distention. No thyroid enlargement, no tenderness.  LUNGS: Normal breath sounds bilaterally, no wheezing, rales,rhonchi or crepitation. No use of accessory muscles of respiration.  CARDIOVASCULAR: S1, S2 normal. No murmurs, rubs, or gallops.  ABDOMEN: Soft, nontender,  nondistended. Bowel sounds present. No organomegaly or mass.  EXTREMITIES: No pedal edema, cyanosis, or clubbing. 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 dressing present after amputation on right foot. NEUROLOGIC: Cranial nerves II through XII are intact. Muscle strength 5/5 in all extremities. Sensation intact. Gait not checked.  PSYCHIATRIC: The patient is alert and oriented x 3.  SKIN: No obvious rash, lesion, or ulcer.   DATA REVIEW:   CBC  Recent Labs Lab 08/23/16 0409  WBC 6.6  HGB 13.1  HCT 37.5*  PLT 229    Chemistries   Recent Labs Lab 08/23/16 0409  NA 140  K 4.4  CL 104  CO2 28  GLUCOSE 182*  BUN 17  CREATININE 0.86  CALCIUM 9.4    Cardiac Enzymes No results for input(s): TROPONINI in the last 168 hours.  Microbiology Results  Results for orders placed or performed during the hospital encounter of 08/20/16  Surgical PCR screen     Status: None   Collection Time: 08/20/16 12:50 PM  Result Value Ref Range  Status   MRSA, PCR NEGATIVE NEGATIVE Final   Staphylococcus aureus NEGATIVE NEGATIVE Final    Comment:        The Xpert SA Assay (FDA approved for NASAL specimens in patients over 22 years of age), is one component of a comprehensive surveillance program.  Test performance has been validated by [facility] for patients greater than or equal to 49 year old. It is not intended to diagnose infection nor to guide or monitor treatment.   Aerobic/Anaerobic Culture (surgical/deep wound)     Status: None (Preliminary result)   Collection Time: 08/21/16 10:53 AM  Result Value Ref Range Status   Specimen Description BONE RAY RESECTION RIGHT FIFTH  Final   Special Requests NONE  Final   Gram Stain   Final    RARE WBC PRESENT,BOTH PMN AND MONONUCLEAR RARE GRAM NEGATIVE RODS    Culture   Final    RARE STAPHYLOCOCCUS LUGDUNENSIS NO ANAEROBES ISOLATED; CULTURE IN PROGRESS FOR 5 DAYS    Report Status PENDING  Incomplete   Organism ID, Bacteria STAPHYLOCOCCUS LUGDUNENSIS  Final      Susceptibility   Staphylococcus lugdunensis - MIC*    CIPROFLOXACIN <=0.5 SENSITIVE Sensitive     ERYTHROMYCIN <=0.25 SENSITIVE Sensitive     GENTAMICIN <=0.5 SENSITIVE Sensitive     OXACILLIN 0.5 SENSITIVE Sensitive     TETRACYCLINE <=1 SENSITIVE Sensitive     VANCOMYCIN <=0.5 SENSITIVE Sensitive     TRIMETH/SULFA <=10 SENSITIVE Sensitive     CLINDAMYCIN <=0.25 SENSITIVE Sensitive     RIFAMPIN <=0.5 SENSITIVE Sensitive     Inducible Clindamycin NEGATIVE Sensitive     * RARE STAPHYLOCOCCUS LUGDUNENSIS    RADIOLOGY:  No results found.  EKG:  No orders found for this or any previous visit.    Management plans discussed with the patient, family and they are in agreement.  CODE STATUS:  Code Status History    Date Active Date Inactive Code Status Order ID Comments User Context   08/20/2016  1:24 PM 08/24/2016  9:06 PM Full Code [id number]  Altamese Dilling, MD Inpatient      TOTAL TIME TAKING  CARE OF THIS PATIENT: 35 minutes.    Altamese Dilling M.D on 08/25/2016 at 10:04 PM  Between 7am to 6pm - Pager - [phone]  After 6pm go to [email or web address] - Social research officer, government  [facility]  Office  [phone]  CC: Primary care physician; Marina Goodell, MD  Note: This dictation was prepared with Dragon dictation along with smaller phrase technology. Any transcriptional errors that result from this process are unintentional.

## 2016-08-26 LAB — AEROBIC/ANAEROBIC CULTURE W GRAM STAIN (SURGICAL/DEEP WOUND)

## 2016-08-26 LAB — AEROBIC/ANAEROBIC CULTURE (SURGICAL/DEEP WOUND)

## 2018-02-24 DIAGNOSIS — E782 Mixed hyperlipidemia: Secondary | ICD-10-CM | POA: Insufficient documentation

## 2019-01-22 DIAGNOSIS — L97512 Non-pressure chronic ulcer of other part of right foot with fat layer exposed: Secondary | ICD-10-CM | POA: Diagnosis not present

## 2019-01-22 DIAGNOSIS — Z89429 Acquired absence of other toe(s), unspecified side: Secondary | ICD-10-CM | POA: Diagnosis not present

## 2019-02-13 DIAGNOSIS — E669 Obesity, unspecified: Secondary | ICD-10-CM | POA: Diagnosis not present

## 2019-02-13 DIAGNOSIS — E782 Mixed hyperlipidemia: Secondary | ICD-10-CM | POA: Diagnosis not present

## 2019-02-13 DIAGNOSIS — E1151 Type 2 diabetes mellitus with diabetic peripheral angiopathy without gangrene: Secondary | ICD-10-CM | POA: Diagnosis not present

## 2019-02-13 DIAGNOSIS — E1165 Type 2 diabetes mellitus with hyperglycemia: Secondary | ICD-10-CM | POA: Diagnosis not present

## 2019-02-13 DIAGNOSIS — I10 Essential (primary) hypertension: Secondary | ICD-10-CM | POA: Diagnosis not present

## 2019-03-12 DIAGNOSIS — L97512 Non-pressure chronic ulcer of other part of right foot with fat layer exposed: Secondary | ICD-10-CM | POA: Diagnosis not present

## 2019-03-28 DIAGNOSIS — Z794 Long term (current) use of insulin: Secondary | ICD-10-CM | POA: Diagnosis not present

## 2019-03-28 DIAGNOSIS — L97512 Non-pressure chronic ulcer of other part of right foot with fat layer exposed: Secondary | ICD-10-CM | POA: Diagnosis not present

## 2019-03-28 DIAGNOSIS — E1140 Type 2 diabetes mellitus with diabetic neuropathy, unspecified: Secondary | ICD-10-CM | POA: Diagnosis not present

## 2019-04-25 DIAGNOSIS — Z794 Long term (current) use of insulin: Secondary | ICD-10-CM | POA: Diagnosis not present

## 2019-04-25 DIAGNOSIS — E1140 Type 2 diabetes mellitus with diabetic neuropathy, unspecified: Secondary | ICD-10-CM | POA: Diagnosis not present

## 2019-04-25 DIAGNOSIS — L97512 Non-pressure chronic ulcer of other part of right foot with fat layer exposed: Secondary | ICD-10-CM | POA: Diagnosis not present

## 2019-05-14 DIAGNOSIS — S98131A Complete traumatic amputation of one right lesser toe, initial encounter: Secondary | ICD-10-CM | POA: Diagnosis not present

## 2019-05-14 DIAGNOSIS — I10 Essential (primary) hypertension: Secondary | ICD-10-CM | POA: Diagnosis not present

## 2019-05-14 DIAGNOSIS — Z89412 Acquired absence of left great toe: Secondary | ICD-10-CM | POA: Diagnosis not present

## 2019-05-14 DIAGNOSIS — E7800 Pure hypercholesterolemia, unspecified: Secondary | ICD-10-CM | POA: Diagnosis not present

## 2019-05-14 DIAGNOSIS — E1151 Type 2 diabetes mellitus with diabetic peripheral angiopathy without gangrene: Secondary | ICD-10-CM | POA: Diagnosis not present

## 2019-05-14 DIAGNOSIS — M129 Arthropathy, unspecified: Secondary | ICD-10-CM | POA: Diagnosis not present

## 2019-05-14 DIAGNOSIS — E1165 Type 2 diabetes mellitus with hyperglycemia: Secondary | ICD-10-CM | POA: Diagnosis not present

## 2019-05-15 DIAGNOSIS — E669 Obesity, unspecified: Secondary | ICD-10-CM | POA: Diagnosis not present

## 2019-05-15 DIAGNOSIS — I10 Essential (primary) hypertension: Secondary | ICD-10-CM | POA: Diagnosis not present

## 2019-05-15 DIAGNOSIS — Z794 Long term (current) use of insulin: Secondary | ICD-10-CM | POA: Diagnosis not present

## 2019-05-15 DIAGNOSIS — E782 Mixed hyperlipidemia: Secondary | ICD-10-CM | POA: Diagnosis not present

## 2019-05-15 DIAGNOSIS — E1151 Type 2 diabetes mellitus with diabetic peripheral angiopathy without gangrene: Secondary | ICD-10-CM | POA: Diagnosis not present

## 2019-05-15 DIAGNOSIS — E1165 Type 2 diabetes mellitus with hyperglycemia: Secondary | ICD-10-CM | POA: Diagnosis not present

## 2019-05-15 DIAGNOSIS — E119 Type 2 diabetes mellitus without complications: Secondary | ICD-10-CM | POA: Diagnosis not present

## 2019-05-18 DIAGNOSIS — L97512 Non-pressure chronic ulcer of other part of right foot with fat layer exposed: Secondary | ICD-10-CM | POA: Diagnosis not present

## 2019-05-18 DIAGNOSIS — L03119 Cellulitis of unspecified part of limb: Secondary | ICD-10-CM | POA: Diagnosis not present

## 2019-05-18 DIAGNOSIS — Z794 Long term (current) use of insulin: Secondary | ICD-10-CM | POA: Diagnosis not present

## 2019-05-18 DIAGNOSIS — E11621 Type 2 diabetes mellitus with foot ulcer: Secondary | ICD-10-CM | POA: Diagnosis not present

## 2019-05-18 DIAGNOSIS — L02619 Cutaneous abscess of unspecified foot: Secondary | ICD-10-CM | POA: Diagnosis not present

## 2019-05-18 DIAGNOSIS — E1140 Type 2 diabetes mellitus with diabetic neuropathy, unspecified: Secondary | ICD-10-CM | POA: Diagnosis not present

## 2019-05-18 DIAGNOSIS — Z89429 Acquired absence of other toe(s), unspecified side: Secondary | ICD-10-CM | POA: Diagnosis not present

## 2019-05-18 DIAGNOSIS — L97519 Non-pressure chronic ulcer of other part of right foot with unspecified severity: Secondary | ICD-10-CM | POA: Diagnosis not present

## 2019-06-04 DIAGNOSIS — Z794 Long term (current) use of insulin: Secondary | ICD-10-CM | POA: Diagnosis not present

## 2019-06-04 DIAGNOSIS — L851 Acquired keratosis [keratoderma] palmaris et plantaris: Secondary | ICD-10-CM | POA: Diagnosis not present

## 2019-06-04 DIAGNOSIS — B351 Tinea unguium: Secondary | ICD-10-CM | POA: Diagnosis not present

## 2019-06-04 DIAGNOSIS — E1140 Type 2 diabetes mellitus with diabetic neuropathy, unspecified: Secondary | ICD-10-CM | POA: Diagnosis not present

## 2019-07-09 DIAGNOSIS — Z951 Presence of aortocoronary bypass graft: Secondary | ICD-10-CM | POA: Diagnosis not present

## 2019-07-09 DIAGNOSIS — I613 Nontraumatic intracerebral hemorrhage in brain stem: Secondary | ICD-10-CM | POA: Diagnosis not present

## 2019-07-09 DIAGNOSIS — I6523 Occlusion and stenosis of bilateral carotid arteries: Secondary | ICD-10-CM | POA: Diagnosis not present

## 2019-07-09 DIAGNOSIS — I10 Essential (primary) hypertension: Secondary | ICD-10-CM | POA: Diagnosis not present

## 2019-07-09 DIAGNOSIS — I639 Cerebral infarction, unspecified: Secondary | ICD-10-CM | POA: Diagnosis not present

## 2019-07-09 DIAGNOSIS — R4781 Slurred speech: Secondary | ICD-10-CM | POA: Diagnosis not present

## 2019-07-09 DIAGNOSIS — J3489 Other specified disorders of nose and nasal sinuses: Secondary | ICD-10-CM | POA: Diagnosis not present

## 2019-07-09 DIAGNOSIS — E1151 Type 2 diabetes mellitus with diabetic peripheral angiopathy without gangrene: Secondary | ICD-10-CM | POA: Diagnosis not present

## 2019-07-09 DIAGNOSIS — E785 Hyperlipidemia, unspecified: Secondary | ICD-10-CM | POA: Diagnosis not present

## 2019-07-09 DIAGNOSIS — Z8673 Personal history of transient ischemic attack (TIA), and cerebral infarction without residual deficits: Secondary | ICD-10-CM | POA: Diagnosis not present

## 2019-07-12 DIAGNOSIS — Z794 Long term (current) use of insulin: Secondary | ICD-10-CM | POA: Diagnosis not present

## 2019-07-12 DIAGNOSIS — E1140 Type 2 diabetes mellitus with diabetic neuropathy, unspecified: Secondary | ICD-10-CM | POA: Diagnosis not present

## 2019-07-12 DIAGNOSIS — L97511 Non-pressure chronic ulcer of other part of right foot limited to breakdown of skin: Secondary | ICD-10-CM | POA: Diagnosis not present

## 2019-07-25 DIAGNOSIS — Z6830 Body mass index (BMI) 30.0-30.9, adult: Secondary | ICD-10-CM | POA: Diagnosis not present

## 2019-07-25 DIAGNOSIS — I6322 Cerebral infarction due to unspecified occlusion or stenosis of basilar arteries: Secondary | ICD-10-CM | POA: Diagnosis not present

## 2019-08-09 DIAGNOSIS — L97511 Non-pressure chronic ulcer of other part of right foot limited to breakdown of skin: Secondary | ICD-10-CM | POA: Diagnosis not present

## 2019-08-09 DIAGNOSIS — Z794 Long term (current) use of insulin: Secondary | ICD-10-CM | POA: Diagnosis not present

## 2019-08-09 DIAGNOSIS — E1140 Type 2 diabetes mellitus with diabetic neuropathy, unspecified: Secondary | ICD-10-CM | POA: Diagnosis not present

## 2019-08-17 DIAGNOSIS — E782 Mixed hyperlipidemia: Secondary | ICD-10-CM | POA: Diagnosis not present

## 2019-08-17 DIAGNOSIS — E669 Obesity, unspecified: Secondary | ICD-10-CM | POA: Diagnosis not present

## 2019-08-17 DIAGNOSIS — I10 Essential (primary) hypertension: Secondary | ICD-10-CM | POA: Diagnosis not present

## 2019-08-17 DIAGNOSIS — E1151 Type 2 diabetes mellitus with diabetic peripheral angiopathy without gangrene: Secondary | ICD-10-CM | POA: Diagnosis not present

## 2019-08-17 DIAGNOSIS — E1165 Type 2 diabetes mellitus with hyperglycemia: Secondary | ICD-10-CM | POA: Diagnosis not present

## 2019-09-24 DIAGNOSIS — Z89429 Acquired absence of other toe(s), unspecified side: Secondary | ICD-10-CM | POA: Diagnosis not present

## 2019-09-24 DIAGNOSIS — L851 Acquired keratosis [keratoderma] palmaris et plantaris: Secondary | ICD-10-CM | POA: Diagnosis not present

## 2019-09-24 DIAGNOSIS — E1140 Type 2 diabetes mellitus with diabetic neuropathy, unspecified: Secondary | ICD-10-CM | POA: Diagnosis not present

## 2019-09-24 DIAGNOSIS — Z794 Long term (current) use of insulin: Secondary | ICD-10-CM | POA: Diagnosis not present

## 2019-09-24 DIAGNOSIS — L97511 Non-pressure chronic ulcer of other part of right foot limited to breakdown of skin: Secondary | ICD-10-CM | POA: Diagnosis not present

## 2019-09-24 DIAGNOSIS — B351 Tinea unguium: Secondary | ICD-10-CM | POA: Diagnosis not present

## 2019-09-25 DIAGNOSIS — R200 Anesthesia of skin: Secondary | ICD-10-CM | POA: Diagnosis not present

## 2019-09-25 DIAGNOSIS — R531 Weakness: Secondary | ICD-10-CM | POA: Diagnosis not present

## 2019-09-25 DIAGNOSIS — I10 Essential (primary) hypertension: Secondary | ICD-10-CM | POA: Diagnosis not present

## 2019-09-25 DIAGNOSIS — Z8673 Personal history of transient ischemic attack (TIA), and cerebral infarction without residual deficits: Secondary | ICD-10-CM | POA: Diagnosis not present

## 2019-09-25 DIAGNOSIS — Z89429 Acquired absence of other toe(s), unspecified side: Secondary | ICD-10-CM | POA: Diagnosis not present

## 2019-09-25 DIAGNOSIS — Z794 Long term (current) use of insulin: Secondary | ICD-10-CM | POA: Diagnosis not present

## 2019-09-25 DIAGNOSIS — R29898 Other symptoms and signs involving the musculoskeletal system: Secondary | ICD-10-CM | POA: Diagnosis not present

## 2019-09-25 DIAGNOSIS — R29700 NIHSS score 0: Secondary | ICD-10-CM | POA: Diagnosis not present

## 2019-09-25 DIAGNOSIS — R29818 Other symptoms and signs involving the nervous system: Secondary | ICD-10-CM | POA: Diagnosis not present

## 2019-09-25 DIAGNOSIS — I2510 Atherosclerotic heart disease of native coronary artery without angina pectoris: Secondary | ICD-10-CM | POA: Diagnosis not present

## 2019-09-25 DIAGNOSIS — E785 Hyperlipidemia, unspecified: Secondary | ICD-10-CM | POA: Diagnosis not present

## 2019-09-25 DIAGNOSIS — E1151 Type 2 diabetes mellitus with diabetic peripheral angiopathy without gangrene: Secondary | ICD-10-CM | POA: Diagnosis not present

## 2019-09-25 DIAGNOSIS — M6281 Muscle weakness (generalized): Secondary | ICD-10-CM | POA: Diagnosis not present

## 2019-10-29 DIAGNOSIS — E1142 Type 2 diabetes mellitus with diabetic polyneuropathy: Secondary | ICD-10-CM | POA: Diagnosis not present

## 2019-10-29 DIAGNOSIS — A5216 Charcot's arthropathy (tabetic): Secondary | ICD-10-CM | POA: Diagnosis not present

## 2019-10-29 DIAGNOSIS — Z89421 Acquired absence of other right toe(s): Secondary | ICD-10-CM | POA: Diagnosis not present

## 2019-11-05 DIAGNOSIS — E1140 Type 2 diabetes mellitus with diabetic neuropathy, unspecified: Secondary | ICD-10-CM | POA: Diagnosis not present

## 2019-11-05 DIAGNOSIS — Z89429 Acquired absence of other toe(s), unspecified side: Secondary | ICD-10-CM | POA: Diagnosis not present

## 2019-11-05 DIAGNOSIS — Z794 Long term (current) use of insulin: Secondary | ICD-10-CM | POA: Diagnosis not present

## 2019-11-05 DIAGNOSIS — L97511 Non-pressure chronic ulcer of other part of right foot limited to breakdown of skin: Secondary | ICD-10-CM | POA: Diagnosis not present

## 2019-11-20 DIAGNOSIS — E1151 Type 2 diabetes mellitus with diabetic peripheral angiopathy without gangrene: Secondary | ICD-10-CM | POA: Diagnosis not present

## 2019-11-20 DIAGNOSIS — E669 Obesity, unspecified: Secondary | ICD-10-CM | POA: Diagnosis not present

## 2019-11-20 DIAGNOSIS — E1165 Type 2 diabetes mellitus with hyperglycemia: Secondary | ICD-10-CM | POA: Diagnosis not present

## 2019-11-20 DIAGNOSIS — I10 Essential (primary) hypertension: Secondary | ICD-10-CM | POA: Diagnosis not present

## 2019-11-20 DIAGNOSIS — E782 Mixed hyperlipidemia: Secondary | ICD-10-CM | POA: Diagnosis not present

## 2019-12-10 DIAGNOSIS — I10 Essential (primary) hypertension: Secondary | ICD-10-CM | POA: Diagnosis not present

## 2019-12-10 DIAGNOSIS — E7800 Pure hypercholesterolemia, unspecified: Secondary | ICD-10-CM | POA: Diagnosis not present

## 2019-12-10 DIAGNOSIS — Z23 Encounter for immunization: Secondary | ICD-10-CM | POA: Diagnosis not present

## 2019-12-10 DIAGNOSIS — Z89412 Acquired absence of left great toe: Secondary | ICD-10-CM | POA: Diagnosis not present

## 2019-12-10 DIAGNOSIS — Z794 Long term (current) use of insulin: Secondary | ICD-10-CM | POA: Diagnosis not present

## 2019-12-10 DIAGNOSIS — E1165 Type 2 diabetes mellitus with hyperglycemia: Secondary | ICD-10-CM | POA: Diagnosis not present

## 2019-12-10 DIAGNOSIS — E1151 Type 2 diabetes mellitus with diabetic peripheral angiopathy without gangrene: Secondary | ICD-10-CM | POA: Diagnosis not present

## 2019-12-10 DIAGNOSIS — M129 Arthropathy, unspecified: Secondary | ICD-10-CM | POA: Diagnosis not present

## 2019-12-10 DIAGNOSIS — Z0000 Encounter for general adult medical examination without abnormal findings: Secondary | ICD-10-CM | POA: Diagnosis not present

## 2019-12-10 DIAGNOSIS — I679 Cerebrovascular disease, unspecified: Secondary | ICD-10-CM | POA: Diagnosis not present

## 2019-12-17 DIAGNOSIS — Z794 Long term (current) use of insulin: Secondary | ICD-10-CM | POA: Diagnosis not present

## 2019-12-17 DIAGNOSIS — Z89429 Acquired absence of other toe(s), unspecified side: Secondary | ICD-10-CM | POA: Diagnosis not present

## 2019-12-17 DIAGNOSIS — E1140 Type 2 diabetes mellitus with diabetic neuropathy, unspecified: Secondary | ICD-10-CM | POA: Diagnosis not present

## 2019-12-17 DIAGNOSIS — L97512 Non-pressure chronic ulcer of other part of right foot with fat layer exposed: Secondary | ICD-10-CM | POA: Diagnosis not present

## 2020-01-07 DIAGNOSIS — Z794 Long term (current) use of insulin: Secondary | ICD-10-CM | POA: Diagnosis not present

## 2020-01-07 DIAGNOSIS — L97512 Non-pressure chronic ulcer of other part of right foot with fat layer exposed: Secondary | ICD-10-CM | POA: Diagnosis not present

## 2020-01-07 DIAGNOSIS — E1140 Type 2 diabetes mellitus with diabetic neuropathy, unspecified: Secondary | ICD-10-CM | POA: Diagnosis not present

## 2020-02-18 DIAGNOSIS — Z89429 Acquired absence of other toe(s), unspecified side: Secondary | ICD-10-CM | POA: Diagnosis not present

## 2020-02-18 DIAGNOSIS — L97521 Non-pressure chronic ulcer of other part of left foot limited to breakdown of skin: Secondary | ICD-10-CM | POA: Diagnosis not present

## 2020-02-18 DIAGNOSIS — L97512 Non-pressure chronic ulcer of other part of right foot with fat layer exposed: Secondary | ICD-10-CM | POA: Diagnosis not present

## 2020-02-18 DIAGNOSIS — E1140 Type 2 diabetes mellitus with diabetic neuropathy, unspecified: Secondary | ICD-10-CM | POA: Diagnosis not present

## 2020-02-18 DIAGNOSIS — B351 Tinea unguium: Secondary | ICD-10-CM | POA: Diagnosis not present

## 2020-02-18 DIAGNOSIS — Z794 Long term (current) use of insulin: Secondary | ICD-10-CM | POA: Diagnosis not present

## 2020-03-14 DIAGNOSIS — E1151 Type 2 diabetes mellitus with diabetic peripheral angiopathy without gangrene: Secondary | ICD-10-CM | POA: Diagnosis not present

## 2020-03-14 DIAGNOSIS — E1165 Type 2 diabetes mellitus with hyperglycemia: Secondary | ICD-10-CM | POA: Diagnosis not present

## 2020-03-17 DIAGNOSIS — Z89429 Acquired absence of other toe(s), unspecified side: Secondary | ICD-10-CM | POA: Diagnosis not present

## 2020-03-17 DIAGNOSIS — L97512 Non-pressure chronic ulcer of other part of right foot with fat layer exposed: Secondary | ICD-10-CM | POA: Diagnosis not present

## 2020-03-17 DIAGNOSIS — Z794 Long term (current) use of insulin: Secondary | ICD-10-CM | POA: Diagnosis not present

## 2020-03-17 DIAGNOSIS — E1140 Type 2 diabetes mellitus with diabetic neuropathy, unspecified: Secondary | ICD-10-CM | POA: Diagnosis not present

## 2020-03-27 DIAGNOSIS — L97514 Non-pressure chronic ulcer of other part of right foot with necrosis of bone: Secondary | ICD-10-CM | POA: Diagnosis not present

## 2020-03-27 DIAGNOSIS — E1140 Type 2 diabetes mellitus with diabetic neuropathy, unspecified: Secondary | ICD-10-CM | POA: Diagnosis not present

## 2020-03-27 DIAGNOSIS — Z89429 Acquired absence of other toe(s), unspecified side: Secondary | ICD-10-CM | POA: Diagnosis not present

## 2020-03-27 DIAGNOSIS — Z794 Long term (current) use of insulin: Secondary | ICD-10-CM | POA: Diagnosis not present

## 2020-03-27 DIAGNOSIS — M86171 Other acute osteomyelitis, right ankle and foot: Secondary | ICD-10-CM | POA: Diagnosis not present

## 2020-03-29 ENCOUNTER — Inpatient Hospital Stay: Payer: Medicare HMO

## 2020-03-29 ENCOUNTER — Other Ambulatory Visit: Payer: Self-pay

## 2020-03-29 ENCOUNTER — Inpatient Hospital Stay
Admission: EM | Admit: 2020-03-29 | Discharge: 2020-04-05 | DRG: 240 | Disposition: A | Discharge status: Home Health | Payer: Medicare HMO | Attending: Internal Medicine | Admitting: Internal Medicine

## 2020-03-29 ENCOUNTER — Encounter: Payer: Self-pay | Admitting: Emergency Medicine

## 2020-03-29 DIAGNOSIS — Z872 Personal history of diseases of the skin and subcutaneous tissue: Secondary | ICD-10-CM | POA: Diagnosis not present

## 2020-03-29 DIAGNOSIS — E1169 Type 2 diabetes mellitus with other specified complication: Secondary | ICD-10-CM

## 2020-03-29 DIAGNOSIS — M869 Osteomyelitis, unspecified: Secondary | ICD-10-CM | POA: Diagnosis not present

## 2020-03-29 DIAGNOSIS — M6258 Muscle wasting and atrophy, not elsewhere classified, other site: Secondary | ICD-10-CM | POA: Diagnosis not present

## 2020-03-29 DIAGNOSIS — Z6831 Body mass index (BMI) 31.0-31.9, adult: Secondary | ICD-10-CM

## 2020-03-29 DIAGNOSIS — E11621 Type 2 diabetes mellitus with foot ulcer: Secondary | ICD-10-CM | POA: Diagnosis not present

## 2020-03-29 DIAGNOSIS — Z8673 Personal history of transient ischemic attack (TIA), and cerebral infarction without residual deficits: Secondary | ICD-10-CM | POA: Diagnosis not present

## 2020-03-29 DIAGNOSIS — I10 Essential (primary) hypertension: Secondary | ICD-10-CM | POA: Diagnosis present

## 2020-03-29 DIAGNOSIS — Z09 Encounter for follow-up examination after completed treatment for conditions other than malignant neoplasm: Secondary | ICD-10-CM

## 2020-03-29 DIAGNOSIS — L97519 Non-pressure chronic ulcer of other part of right foot with unspecified severity: Secondary | ICD-10-CM | POA: Diagnosis not present

## 2020-03-29 DIAGNOSIS — E118 Type 2 diabetes mellitus with unspecified complications: Secondary | ICD-10-CM

## 2020-03-29 DIAGNOSIS — Z9889 Other specified postprocedural states: Secondary | ICD-10-CM | POA: Diagnosis not present

## 2020-03-29 DIAGNOSIS — I739 Peripheral vascular disease, unspecified: Secondary | ICD-10-CM

## 2020-03-29 DIAGNOSIS — Z833 Family history of diabetes mellitus: Secondary | ICD-10-CM | POA: Diagnosis not present

## 2020-03-29 DIAGNOSIS — L03119 Cellulitis of unspecified part of limb: Secondary | ICD-10-CM | POA: Diagnosis not present

## 2020-03-29 DIAGNOSIS — Z7982 Long term (current) use of aspirin: Secondary | ICD-10-CM

## 2020-03-29 DIAGNOSIS — L97414 Non-pressure chronic ulcer of right heel and midfoot with necrosis of bone: Secondary | ICD-10-CM | POA: Diagnosis present

## 2020-03-29 DIAGNOSIS — E11610 Type 2 diabetes mellitus with diabetic neuropathic arthropathy: Secondary | ICD-10-CM | POA: Diagnosis present

## 2020-03-29 DIAGNOSIS — E7800 Pure hypercholesterolemia, unspecified: Secondary | ICD-10-CM | POA: Diagnosis present

## 2020-03-29 DIAGNOSIS — E1142 Type 2 diabetes mellitus with diabetic polyneuropathy: Secondary | ICD-10-CM | POA: Diagnosis not present

## 2020-03-29 DIAGNOSIS — T8789 Other complications of amputation stump: Secondary | ICD-10-CM | POA: Diagnosis not present

## 2020-03-29 DIAGNOSIS — L97413 Non-pressure chronic ulcer of right heel and midfoot with necrosis of muscle: Secondary | ICD-10-CM | POA: Diagnosis not present

## 2020-03-29 DIAGNOSIS — I2510 Atherosclerotic heart disease of native coronary artery without angina pectoris: Secondary | ICD-10-CM | POA: Diagnosis not present

## 2020-03-29 DIAGNOSIS — L089 Local infection of the skin and subcutaneous tissue, unspecified: Secondary | ICD-10-CM | POA: Diagnosis not present

## 2020-03-29 DIAGNOSIS — M19071 Primary osteoarthritis, right ankle and foot: Secondary | ICD-10-CM | POA: Diagnosis present

## 2020-03-29 DIAGNOSIS — E1165 Type 2 diabetes mellitus with hyperglycemia: Secondary | ICD-10-CM | POA: Diagnosis present

## 2020-03-29 DIAGNOSIS — Z79899 Other long term (current) drug therapy: Secondary | ICD-10-CM

## 2020-03-29 DIAGNOSIS — S91301A Unspecified open wound, right foot, initial encounter: Secondary | ICD-10-CM | POA: Diagnosis not present

## 2020-03-29 DIAGNOSIS — Z794 Long term (current) use of insulin: Secondary | ICD-10-CM

## 2020-03-29 DIAGNOSIS — M86671 Other chronic osteomyelitis, right ankle and foot: Secondary | ICD-10-CM | POA: Diagnosis not present

## 2020-03-29 DIAGNOSIS — M86171 Other acute osteomyelitis, right ankle and foot: Secondary | ICD-10-CM | POA: Diagnosis not present

## 2020-03-29 DIAGNOSIS — Z951 Presence of aortocoronary bypass graft: Secondary | ICD-10-CM

## 2020-03-29 DIAGNOSIS — E782 Mixed hyperlipidemia: Secondary | ICD-10-CM | POA: Diagnosis not present

## 2020-03-29 DIAGNOSIS — L97419 Non-pressure chronic ulcer of right heel and midfoot with unspecified severity: Secondary | ICD-10-CM | POA: Diagnosis not present

## 2020-03-29 DIAGNOSIS — E11649 Type 2 diabetes mellitus with hypoglycemia without coma: Secondary | ICD-10-CM | POA: Diagnosis present

## 2020-03-29 DIAGNOSIS — M86072 Acute hematogenous osteomyelitis, left ankle and foot: Secondary | ICD-10-CM | POA: Diagnosis not present

## 2020-03-29 DIAGNOSIS — L03115 Cellulitis of right lower limb: Secondary | ICD-10-CM | POA: Diagnosis present

## 2020-03-29 DIAGNOSIS — E669 Obesity, unspecified: Secondary | ICD-10-CM | POA: Diagnosis present

## 2020-03-29 DIAGNOSIS — Z20822 Contact with and (suspected) exposure to covid-19: Secondary | ICD-10-CM | POA: Diagnosis present

## 2020-03-29 DIAGNOSIS — E11628 Type 2 diabetes mellitus with other skin complications: Secondary | ICD-10-CM | POA: Diagnosis present

## 2020-03-29 DIAGNOSIS — R251 Tremor, unspecified: Secondary | ICD-10-CM | POA: Diagnosis present

## 2020-03-29 DIAGNOSIS — E1151 Type 2 diabetes mellitus with diabetic peripheral angiopathy without gangrene: Principal | ICD-10-CM | POA: Diagnosis present

## 2020-03-29 DIAGNOSIS — Z7984 Long term (current) use of oral hypoglycemic drugs: Secondary | ICD-10-CM | POA: Diagnosis not present

## 2020-03-29 DIAGNOSIS — L97511 Non-pressure chronic ulcer of other part of right foot limited to breakdown of skin: Secondary | ICD-10-CM | POA: Diagnosis not present

## 2020-03-29 DIAGNOSIS — L0390 Cellulitis, unspecified: Secondary | ICD-10-CM | POA: Diagnosis present

## 2020-03-29 DIAGNOSIS — I70201 Unspecified atherosclerosis of native arteries of extremities, right leg: Secondary | ICD-10-CM | POA: Diagnosis not present

## 2020-03-29 DIAGNOSIS — L97415 Non-pressure chronic ulcer of right heel and midfoot with muscle involvement without evidence of necrosis: Secondary | ICD-10-CM | POA: Diagnosis not present

## 2020-03-29 DIAGNOSIS — Z981 Arthrodesis status: Secondary | ICD-10-CM | POA: Diagnosis not present

## 2020-03-29 DIAGNOSIS — Z7902 Long term (current) use of antithrombotics/antiplatelets: Secondary | ICD-10-CM

## 2020-03-29 DIAGNOSIS — L97509 Non-pressure chronic ulcer of other part of unspecified foot with unspecified severity: Secondary | ICD-10-CM

## 2020-03-29 DIAGNOSIS — E08621 Diabetes mellitus due to underlying condition with foot ulcer: Secondary | ICD-10-CM | POA: Diagnosis not present

## 2020-03-29 DIAGNOSIS — E785 Hyperlipidemia, unspecified: Secondary | ICD-10-CM | POA: Diagnosis not present

## 2020-03-29 DIAGNOSIS — S98911A Complete traumatic amputation of right foot, level unspecified, initial encounter: Secondary | ICD-10-CM | POA: Diagnosis not present

## 2020-03-29 DIAGNOSIS — Z89421 Acquired absence of other right toe(s): Secondary | ICD-10-CM | POA: Diagnosis not present

## 2020-03-29 LAB — CBC WITH DIFFERENTIAL/PLATELET
Abs Immature Granulocytes: 0.04 10*3/uL (ref 0.00–0.07)
Basophils Absolute: 0 10*3/uL (ref 0.0–0.1)
Basophils Relative: 1 %
Eosinophils Absolute: 0.3 10*3/uL (ref 0.0–0.5)
Eosinophils Relative: 5 %
HCT: 35.3 % — ABNORMAL LOW (ref 39.0–52.0)
Hemoglobin: 11.8 g/dL — ABNORMAL LOW (ref 13.0–17.0)
Immature Granulocytes: 1 %
Lymphocytes Relative: 19 %
Lymphs Abs: 1.2 10*3/uL (ref 0.7–4.0)
MCH: 29 pg (ref 26.0–34.0)
MCHC: 33.4 g/dL (ref 30.0–36.0)
MCV: 86.7 fL (ref 80.0–100.0)
Monocytes Absolute: 0.7 10*3/uL (ref 0.1–1.0)
Monocytes Relative: 11 %
Neutro Abs: 4 10*3/uL (ref 1.7–7.7)
Neutrophils Relative %: 63 %
Platelets: 286 10*3/uL (ref 150–400)
RBC: 4.07 MIL/uL — ABNORMAL LOW (ref 4.22–5.81)
RDW: 11.8 % (ref 11.5–15.5)
WBC: 6.2 10*3/uL (ref 4.0–10.5)
nRBC: 0 % (ref 0.0–0.2)

## 2020-03-29 LAB — HEPATIC FUNCTION PANEL
ALT: 22 U/L (ref 0–44)
AST: 22 U/L (ref 15–41)
Albumin: 3.3 g/dL — ABNORMAL LOW (ref 3.5–5.0)
Alkaline Phosphatase: 79 U/L (ref 38–126)
Bilirubin, Direct: 0.1 mg/dL (ref 0.0–0.2)
Indirect Bilirubin: 0.6 mg/dL (ref 0.3–0.9)
Total Bilirubin: 0.7 mg/dL (ref 0.3–1.2)
Total Protein: 7.6 g/dL (ref 6.5–8.1)

## 2020-03-29 LAB — BASIC METABOLIC PANEL
Anion gap: 8 (ref 5–15)
BUN: 22 mg/dL (ref 8–23)
CO2: 26 mmol/L (ref 22–32)
Calcium: 9.7 mg/dL (ref 8.9–10.3)
Chloride: 103 mmol/L (ref 98–111)
Creatinine, Ser: 0.87 mg/dL (ref 0.61–1.24)
GFR, Estimated: >60 mL/min (ref >60)
Glucose, Bld: 104 mg/dL — ABNORMAL HIGH (ref 70–99)
Potassium: 3.7 mmol/L (ref 3.5–5.1)
Sodium: 137 mmol/L (ref 135–145)

## 2020-03-29 LAB — SEDIMENTATION RATE: Sed Rate: 68 mm/hr — ABNORMAL HIGH (ref 0–20)

## 2020-03-29 LAB — GLUCOSE, CAPILLARY: Glucose-Capillary: 194 mg/dL — ABNORMAL HIGH (ref 70–99)

## 2020-03-29 LAB — MAGNESIUM: Magnesium: 1.5 mg/dL — ABNORMAL LOW (ref 1.7–2.4)

## 2020-03-29 LAB — CBG MONITORING, ED: Glucose-Capillary: 56 mg/dL — ABNORMAL LOW (ref 70–99)

## 2020-03-29 MED ORDER — SODIUM CHLORIDE 0.9 % IV SOLN
2.0000 g | Freq: Three times a day (TID) | INTRAVENOUS | Status: AC
  Administered 2020-03-30 – 2020-04-01 (×9): 2 g via INTRAVENOUS
  Filled 2020-03-29 (×11): qty 2

## 2020-03-29 MED ORDER — SODIUM CHLORIDE 0.9 % IV SOLN
75.0000 mL/h | INTRAVENOUS | Status: AC
  Administered 2020-03-29: 75 mL/h via INTRAVENOUS

## 2020-03-29 MED ORDER — SODIUM CHLORIDE 0.9 % IV SOLN
2.0000 g | Freq: Once | INTRAVENOUS | Status: AC
  Administered 2020-03-29: 2 g via INTRAVENOUS
  Filled 2020-03-29: qty 2

## 2020-03-29 MED ORDER — INSULIN GLARGINE 100 UNIT/ML ~~LOC~~ SOLN
5.0000 [IU] | Freq: Every day | SUBCUTANEOUS | Status: DC
  Administered 2020-03-30 – 2020-04-03 (×5): 5 [IU] via SUBCUTANEOUS
  Filled 2020-03-29 (×6): qty 0.05

## 2020-03-29 MED ORDER — VANCOMYCIN HCL 2000 MG/400ML IV SOLN
2000.0000 mg | Freq: Once | INTRAVENOUS | Status: AC
  Administered 2020-03-29: 2000 mg via INTRAVENOUS
  Filled 2020-03-29: qty 400

## 2020-03-29 MED ORDER — PRAVASTATIN SODIUM 20 MG PO TABS
40.0000 mg | ORAL_TABLET | Freq: Every day | ORAL | Status: DC
  Administered 2020-03-30 – 2020-04-05 (×8): 40 mg via ORAL
  Filled 2020-03-29 (×7): qty 2

## 2020-03-29 MED ORDER — ACETAMINOPHEN 325 MG PO TABS: 650.0000 mg | ORAL_TABLET | Freq: Four times a day (QID) | ORAL | Status: DC | PRN

## 2020-03-29 MED ORDER — RAMIPRIL 10 MG PO CAPS
10.0000 mg | ORAL_CAPSULE | Freq: Every day | ORAL | Status: DC
  Administered 2020-03-30 – 2020-04-05 (×7): 10 mg via ORAL
  Filled 2020-03-29 (×7): qty 1

## 2020-03-29 MED ORDER — ACETAMINOPHEN 650 MG RE SUPP: 650.0000 mg | Freq: Four times a day (QID) | RECTAL | Status: DC | PRN

## 2020-03-29 MED ORDER — VANCOMYCIN HCL 1500 MG/300ML IV SOLN
1500.0000 mg | INTRAVENOUS | Status: DC
  Filled 2020-03-29: qty 300

## 2020-03-29 MED ORDER — CLONIDINE HCL 0.1 MG PO TABS: 0.1000 mg | ORAL_TABLET | Freq: Once | ORAL | Status: DC

## 2020-03-29 MED ORDER — INSULIN ASPART 100 UNIT/ML ~~LOC~~ SOLN
0.0000 [IU] | SUBCUTANEOUS | Status: DC
  Administered 2020-03-30 (×2): 2 [IU] via SUBCUTANEOUS
  Administered 2020-03-30: 5 [IU] via SUBCUTANEOUS
  Administered 2020-03-31: 1 [IU] via SUBCUTANEOUS
  Administered 2020-03-31 – 2020-04-01 (×4): 2 [IU] via SUBCUTANEOUS
  Administered 2020-04-01: 1 [IU] via SUBCUTANEOUS
  Administered 2020-04-01: 3 [IU] via SUBCUTANEOUS
  Administered 2020-04-01 (×2): 2 [IU] via SUBCUTANEOUS
  Administered 2020-04-02: 1 [IU] via SUBCUTANEOUS
  Administered 2020-04-02: 3 [IU] via SUBCUTANEOUS
  Administered 2020-04-02 (×2): 2 [IU] via SUBCUTANEOUS
  Administered 2020-04-03: 5 [IU] via SUBCUTANEOUS
  Administered 2020-04-03: 3 [IU] via SUBCUTANEOUS
  Administered 2020-04-03 – 2020-04-04 (×3): 2 [IU] via SUBCUTANEOUS
  Administered 2020-04-04: 5 [IU] via SUBCUTANEOUS
  Administered 2020-04-04 (×2): 2 [IU] via SUBCUTANEOUS
  Administered 2020-04-04 – 2020-04-05 (×3): 3 [IU] via SUBCUTANEOUS
  Administered 2020-04-05: 2 [IU] via SUBCUTANEOUS
  Administered 2020-04-05: 3 [IU] via SUBCUTANEOUS
  Administered 2020-04-05: 2 [IU] via SUBCUTANEOUS
  Filled 2020-03-29 (×31): qty 1

## 2020-03-29 MED ORDER — PROPRANOLOL HCL 20 MG PO TABS
40.0000 mg | ORAL_TABLET | Freq: Three times a day (TID) | ORAL | Status: DC
  Administered 2020-03-30 – 2020-04-05 (×15): 40 mg via ORAL
  Filled 2020-03-29 (×17): qty 2

## 2020-03-29 MED ORDER — HYDROCODONE-ACETAMINOPHEN 5-325 MG PO TABS
1.0000 | ORAL_TABLET | ORAL | Status: DC | PRN
  Administered 2020-04-03 (×2): 1 via ORAL
  Filled 2020-03-29 (×2): qty 1

## 2020-03-29 NOTE — ED Notes (Signed)
Pt given sandwich tray and orange juice. 

## 2020-03-29 NOTE — Progress Notes (Signed)
Pharmacy Antibiotic Note  Jason [name] is a 75 y.o. male admitted on 03/29/2020 with cellulitis of R foot .  Pharmacy has been consulted for vancomycin and cefepime dosing.  Pt afebrile, WBC WNL  Plan: Cefepime 2g IV q8h Vancomycin 2g bolus then vanc 1500 mg IV q24h (estAUC 440 using Vd 0.5 for BMI >30. goal AUC 400-550) Monitor clinical picture, renal function, vanc levels if needed pending duration of therapy F/U C&S, abx de-escalation, LOT F/u osteomyelitis w/u   Height: 5\' 10"  (177.8 cm) Weight: 99.8 kg (220 lb) IBW/kg (Calculated) : 73  Temp (24hrs), Avg:98 F (36.7 C), Min:97.8 F (36.6 C), Max:98.2 F (36.8 C)  Recent Labs  Lab 03/29/20 1616  WBC 6.2  CREATININE 0.87    Estimated Creatinine Clearance: 88.2 mL/min (by C-G formula based on SCr of 0.87 mg/dL).    No Known Allergies  Antimicrobials this admission: Vancomycin 3/19> Cefepime 3/19> (cipro and augmentin prior to admission)  Microbiology results: 3/19 BCx in process    4/19, PharmD Clinical Pharmacist  03/29/2020   8:46 PM

## 2020-03-29 NOTE — Progress Notes (Signed)
PHARMACY -  BRIEF ANTIBIOTIC NOTE   Pharmacy has received consult(s) for osteomyelitis  from an ED provider.  The patient's profile has been reviewed for ht/wt/allergies/indication/available labs.    One time order(s) placed for vancomycin and cefepime  Further antibiotics/pharmacy consults should be ordered by admitting physician if indicated.                       Thank you, Doroteo Glassman 03/29/2020  8:00 PM

## 2020-03-29 NOTE — H&P (Signed)
Jason [name] LKJ:[id number] DOB: 02-11-1945 DOA: 03/29/2020    PCP: Marina Goodell, MD   Outpatient Specialists:    NEurology  Dr. Algis Greenhouse   Podiatry Dr.Cline  Dr. Azucena Fallen Patient arrived to ER on 03/29/20 at 1504 Referred by Attending Gilles Chiquito, MD   Patient coming from: home Lives  With family    Chief Complaint:   Chief Complaint  Patient presents with  . Foot Pain    HPI: Jason [name] is a 75 y.o. male with medical history significant of  CAD, DM, HTN, HDL, PVD,  tremor, CVA occlusion of basilar artery     Presented with   wound to his right foot started to bleed for the past few day  Was seen by Podiatrist on 17 th had X rays done, was started on Cipro/Augmentin was trying to get ABI's done on Friday but did not work out who he suggested patient come to ER on Saturday. To get admitted so the can et his ABI's done on Monday,   no fever no chills no n/v No pain in his foot now but he had some  2 days ago  Infectious risk factors:  Reports none  Has been vaccinated against COVID    Initial COVID TEST   in house  PCR testing  Pending  No results found for: SARSCOV2NAA   Regarding pertinent Chronic problems:     Hyperlipidemia -  on statins Pravachol    HTN on propranonol     CAD  - On Aspirin, statin, betablocker, Plavix                    DM 2 -  on insulin,  meds only     obesity-   BMI Readings from Last 1 Encounters:  03/29/20 31.57 kg/m       Hx of CVA -  with/out residual deficits on Aspirin 81 , Plavix and statin      Chronic anemia - baseline hg Hemoglobin & Hematocrit  Recent Labs    03/29/20 1616  HGB 11.8*      While in ER:  Plain imaging 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 This limits assessment for acute abnormalities. Smooth periosteal reaction about the lateral metatarsals is likely chronic, however no prior exams available for comparison. Sed rate 68 Case discussed with the podiatry  who feels that patient most likely has osteomyelitis recommended ordering MRI Lucina consult tomorrow for possible operative intervention in start on IV antibiotics  ED Triage Vitals  Enc Vitals Group     BP 03/29/20 1512 (!) 174/112     Pulse Rate 03/29/20 1512 64     Resp 03/29/20 1514 16     Temp 03/29/20 1512 97.8 F (36.6 C)     Temp Source 03/29/20 1512 Oral     SpO2 03/29/20 1512 97 %     Weight 03/29/20 1514 220 lb (99.8 kg)     Height 03/29/20 1514 5\' 10"  (1.778 m)     Head Circumference --      Peak Flow --      Pain Score 03/29/20 1514 0     Pain Loc --      Pain Edu? --      Excl. in GC? --   TMAX(24)@     _________________________________________ Significant initial  Findings: Abnormal Labs Reviewed  CBC WITH DIFFERENTIAL/PLATELET - Abnormal; Notable for the following components:      Result  Value   RBC 4.07 (*)    Hemoglobin 11.8 (*)    HCT 35.3 (*)    All other components within normal limits  BASIC METABOLIC PANEL - Abnormal; Notable for the following components:   Glucose, Bld 104 (*)    All other components within normal limits  SEDIMENTATION RATE - Abnormal; Notable for the following components:   Sed Rate 68 (*)    All other components within normal limits   ____________________________________________ Ordered  right foot - chronic, _________________________   ECG: Ordered Personally reviewed by me showing: HR : 65 Rhythm:  NSR,   no evidence of ischemic changes QTC 425 __________  The recent clinical data is shown below. Vitals:   03/29/20 1512 03/29/20 1514 03/29/20 1854  BP: (!) 174/112  (!) 219/81  Pulse: 64  (!) 58  Resp:  16 18  Temp: 97.8 F (36.6 C)  98.2 F (36.8 C)  TempSrc: Oral  Oral  SpO2: 97%  100%  Weight:  99.8 kg   Height:   (1.778 m)     WBC     Component Value Date/Time   WBC 6.2 03/29/2020 1616   LYMPHSABS 1.2 03/29/2020 1616   MONOABS 0.7 03/29/2020 1616   EOSABS 0.3 03/29/2020 1616   BASOSABS 0.0  03/29/2020 1616      UA not ordered    _______________________________________________________ ER Provider  podiatrist Called:      Dr. Ether Griffins  They Recommend admit to medicine IV antibiotics with plan for likely operative debridement and vascular consult tomorrow. Will see in AM   _______________________________________________ Hospitalist was called for admission for  Right foot cellulitis  The following Work up has been ordered so far:  Orders Placed This Encounter  Procedures  . 1-3 Lead EKG Interpretation  . SARS CORONAVIRUS 2 (TAT 6-24 HRS) Nasopharyngeal Nasopharyngeal Swab  . Blood culture (routine x 2)  . Aerobic/Anaerobic Culture w Gram Stain (surgical/deep wound)  . MR FOOT RIGHT WO CONTRAST  . CBC with Differential  . Basic metabolic panel  . Sedimentation rate  . C-reactive protein  . Consult to hospitalist  . vancomycin per pharmacy consult  . CeFEPIme (MAXIPIME) per pharmacy consult  . Airborne and Contact precautions    Following Medications were ordered in ER: Medications - No data to display      Consult Orders  (From admission, onward)         Start     Ordered   03/29/20 1927  Consult to hospitalist  Once       Provider:  (Not yet assigned)  Question Answer Comment  Place call to: [phone]   Reason for Consult Admit   Diagnosis/Clinical Info for Consult: diabetic foot wound, osteo      03/29/20 1927            OTHER Significant initial  Findings:  labs showing:    Recent Labs  Lab 03/29/20 1616  NA 137  K 3.7  CO2 26  GLUCOSE 104*  BUN 22  CREATININE 0.87  CALCIUM 9.7    Cr    Stable  Lab Results  Component Value Date   CREATININE 0.87 03/29/2020   CREATININE 0.86 08/23/2016   CREATININE 0.83 08/21/2016    No results for input(s): AST, ALT, ALKPHOS, BILITOT, PROT, ALBUMIN in the last 168 hours. Lab Results  Component Value Date   CALCIUM 9.7 03/29/2020      Plt: Lab Results  Component Value Date   PLT  286 03/29/2020       Recent Labs  Lab 03/29/20 1616  WBC 6.2  NEUTROABS 4.0  HGB 11.8*  HCT 35.3*  MCV 86.7  PLT 286    HG/HCT stable,       Component Value Date/Time   HGB 11.8 (L) 03/29/2020 1616   HCT 35.3 (L) 03/29/2020 1616   MCV 86.7 03/29/2020 1616     DM  labs:  HbA1C: No results for input(s): HGBA1C in the last 8760 hours.     CBG (last 3)  Recent Labs    03/29/20 2039  GLUCAP 56*          Cultures:    Component Value Date/Time   SDES BONE RAY RESECTION RIGHT FIFTH 08/21/2016 1053   SPECREQUEST NONE 08/21/2016 1053   CULT  08/21/2016 1053    RARE STAPHYLOCOCCUS LUGDUNENSIS NO ANAEROBES ISOLATED Performed at [facility], [address], [place], [place] [address]    REPTSTATUS 08/26/2016 FINAL 08/21/2016 1053     Radiological Exams on Admission: No results found. _______________________________________________________________________________________________________ Latest  Blood pressure (!) 219/81, pulse (!) 58, temperature 98.2 F (36.8 C), temperature source Oral, resp. rate 18, height 5\' 10"  (1.778 m), weight 99.8 kg, SpO2 100 %.   Review of Systems:    Pertinent positives include:foor pain   Constitutional:  No weight loss, night sweats, Fevers, chills, fatigue, weight loss  HEENT:  No headaches, Difficulty swallowing,Tooth/dental problems,Sore throat,  No sneezing, itching, ear ache, nasal congestion, post nasal drip,  Cardio-vascular:  No chest pain, Orthopnea, PND, anasarca, dizziness, palpitations.no Bilateral lower extremity swelling  GI:  No heartburn, indigestion, abdominal pain, nausea, vomiting, diarrhea, change in bowel habits, loss of appetite, melena, blood in stool, hematemesis Resp:  no shortness of breath at rest. No dyspnea on exertion, No excess mucus, no productive cough, No non-productive cough, No coughing up of blood.No change in color of mucus.No wheezing. Skin:  no rash or lesions. No jaundice GU:  no  dysuria, change in color of urine, no urgency or frequency. No straining to urinate.  No flank pain.  Musculoskeletal:  No joint pain or no joint swelling. No decreased range of motion. No back pain.  Psych:  No change in mood or affect. No depression or anxiety. No memory loss.  Neuro: no localizing neurological complaints, no tingling, no weakness, no double vision, no gait abnormality, no slurred speech, no confusion  All systems reviewed and apart from HOPI all are negative _______________________________________________________________________________________________ Past Medical History:   Past Medical History:  Diagnosis Date  . CAD (coronary artery disease)   . Colon polyp   . Diabetes mellitus without complication (HCC)   . Hyperlipemia   . Hypertension   . Orchitis   . PVD (peripheral vascular disease) (HCC)   . Tremor       Past Surgical History:  Procedure Laterality Date  . AMPUTATION Right 08/21/2016   Procedure: RAY RESECTION-RIGHT 5TH RAY;  Surgeon: 10/21/2016, DPM;  Location: [facility] ORS;  Service: Podiatry;  Laterality: Right;  . AMPUTATION TOE Left    big  . CAROTID ENDARTERECTOMY Right   . COLON SURGERY    . CORONARY ARTERY BYPASS GRAFT    . ROTATOR CUFF REPAIR Right     Social History:  Ambulatory orthodics     reports that he has never smoked. He has never used smokeless tobacco. He reports that he does not drink alcohol and does not use drugs.     Family History:  Family History  Problem Relation Age of Onset  . Diabetes Mother   . Diabetes Father    ______________________________________________________________________________________________ Allergies: No Known Allergies   Prior to Admission medications   Medication Sig Start Date End Date Taking? Authorizing Provider  aspirin 81 MG tablet Take 81 mg by mouth daily.    [provider]  clopidogrel (PLAVIX) 75 MG tablet Take 75 mg by mouth daily.    [provider]   docusate sodium (COLACE) 100 MG capsule Take 1 capsule (100 mg total) by mouth 2 (two) times daily as needed for mild constipation. 08/24/16   Altamese Dilling, MD  glimepiride (AMARYL) 4 MG tablet Take 4 mg by mouth 2 (two) times daily.     [provider]  insulin glargine (LANTUS) 100 UNIT/ML injection Inject 9 Units into the skin at bedtime.     [provider]  insulin regular (NOVOLIN R,HUMULIN R) 100 units/mL injection Inject 5-10 Units into the skin 3 (three) times daily before meals.     [provider]  Magnesium 250 MG TABS Take by mouth.    [provider]  metFORMIN (GLUCOPHAGE) 1000 MG tablet Take 1,000 mg by mouth 2 (two) times daily with a meal.    [provider]  Multiple Vitamin (MULTIVITAMIN) capsule Take 1 capsule by mouth daily.    [provider]  oxyCODONE-acetaminophen (PERCOCET/ROXICET) 5-325 MG tablet Take 1 tablet by mouth every 6 (six) hours as needed for severe pain. 08/24/16   Altamese Dilling, MD  povidone-iodine (BETADINE) 10 % external solution To apply and clean the wound 3 times a week. 08/24/16   Altamese Dilling, MD  pravastatin (PRAVACHOL) 40 MG tablet Take 40 mg by mouth daily.    [provider]  propranolol (INDERAL) 40 MG tablet Take 1 tablet (40 mg total) by mouth 3 (three) times daily. 08/24/16   Altamese Dilling, MD  ramipril (ALTACE) 10 MG capsule Take 10 mg by mouth daily.    [provider]    ___________________________________________________________________________________________________ Physical Exam: Vitals with BMI 03/29/2020 03/29/2020 03/29/2020  Height - 5\' 10"  -  Weight - 220 lbs -  BMI - 31.57 -  Systolic 219 - 174  Diastolic 81 - 112  Pulse 58 - 64     1. General:  in No Acute distress    Chronically ill   2. Psychological: Alert and  Oriented 3. Head/ENT:   Moist  Mucous Membranes                          Head Non traumatic, neck  supple                          Poor Dentition 4. SKIN:   decreased Skin turgor,  Skin  Noted ulcer on right foot      5. Heart: Regular rate and rhythm no  Murmur, no Rub or gallop 6. Lungs:  Clear to auscultation bilaterally, no wheezes or crackles   7. Abdomen: Soft,  non-tender, Non distended   obese  bowel sounds present 8. Lower extremities: no clubbing, cyanosis, no edema 9. Neurologically Grossly intact, moving all 4 extremities equally  10. MSK: Normal range of motion    Chart has been reviewed  ______________________________________________________________________________________________  Assessment/Plan  75 y.o. male with medical history significant of  CAD, DM, HTN, HDL, PVD,  tremor, CVA occlusion of basilar artery Admitted for right foot diabetic  ulcer  Present on Admission:   . Cellulitis in diabetic foot (HCC) with diabetic foot ulcer possible cellulitis -admit per cellulitis protocol will      Vancomycin/cefepime  ,        plain films showed:  no evidence of air  no evidence of osteomyelitis    no               foreign   objects      MRi of the foot pending    Will obtain MRSA screening,     obtain blood cultures if febrile or septic     further antibiotic adjustment pending above results ORDERED ABI Sed rate elavetd Keep NPO for possible surgical intervention in AM  . CAD (coronary artery disease) -  - chronic, continue  and statin and beta blocker  Hold aspirin  . Essential hypertension - BP elevated continue home meds  . DM (diabetes mellitus), type 2 with complications (HCC)-  - Order Sensitive   SSI   - given hypoglycemia hold Lantus for tonight resume tomorrow if BG is stable  -  check TSH and HgA1C  - Hold by mouth medications    hx of CVA - hold plavix, and aspirin resume statin when able  Other plan as per orders.  DVT prophylaxis:  SCD    Code Status:    Code Status: Prior FULL CODE  as per patient   I had personally discussed CODE  STATUS with patient    Family Communication:   Family not at  Bedside   Disposition Plan:      To home once workup is complete and patient is stable   Following barriers for discharge:                            Electrolytes corrected                                                          Pain controlled with PO medications                                 transition to PO antibiotics                             Will need to be able to tolerate PO                                                       Will need consultants to evaluate patient prior to discharge                      Would benefit from PT/OT eval prior to DC  Ordered                                      Diabetes care coordinator  Transition of care consulted                   Nutrition    consulted                  Wound care  consulted                   Palliative care    consulted                   Behavioral health  consulted                    Consults called: Podiatrist   Admission status:  ED Disposition    ED Disposition Condition Comment   Admit  Hospital Area: [facility] [[id number]]  Level of Care: Med-Surg [16]  Covid Evaluation: Asymptomatic Screening Protocol (No Symptoms)  Diagnosis: Cellulitis [098119]  Admitting Physician: Therisa Doyne [[id number]]  Attending Physician: Therisa Doyne [[id number]]  Estimated length of stay: 3 - 4 days  Certification:: I certify this patient will need inpatient services for at least 2 midnights         inpatient     I Expect 2 midnight stay secondary to severity of patient's current illness need for inpatient interventions justified by the following:  Severe lab/radiological/exam abnormalities including:    right foot diabetic ulcer with infection and extensive comorbidities including:  DM2   CAD .     That are currently affecting medical management.   I expect  patient to be hospitalized for 2 midnights requiring  inpatient medical care.  Patient is at high risk for adverse outcome (such as loss of life or disability) if not treated.  Indication for inpatient stay as follows:   ,  inability to maintain oral hydration    Need for operative/procedural  intervention    Need for IV antibiotics, IV fluids   Level of care   tele  For 12H     No results found for: SARSCOV2NAA   Precautions: admitted as asymptomatic screening protocol   PPE: Used by the provider:   N95  eye Goggles,  Gloves     Daneisha Surges 03/29/2020, 9:32 PM    [facility]     after 2 AM please page floor coverage PA If 7AM-7PM, please contact the day team taking care of the patient using [email or web address]   Patient was evaluated in the context of the global COVID-19 pandemic, which necessitated consideration that the patient might be at risk for infection with the SARS-CoV-2 virus that causes COVID-19. Institutional protocols and algorithms that pertain to the evaluation of patients at risk for COVID-19 are in a state of rapid change based on information released by regulatory bodies including the CDC and federal and state organizations. These policies and algorithms were followed during the patient's care.

## 2020-03-29 NOTE — ED Provider Notes (Addendum)
[facility] Emergency Department Provider Note  ____________________________________________   Event Date/Time   First MD Initiated Contact with Patient 03/29/20 1912     (approximate)  I have reviewed the triage vital signs and the nursing notes.   HISTORY  Chief Complaint Foot Pain   HPI Jason [name] is a 75 y.o. male no past medical history of CAD, DM, HTN, HDL, PVD, orchitis and tremor who presents for assessment of wound to his right foot.  Patient states he thinks he got infected sometime around last week and was seen by his podiatrist on the 17th who placed him on Cipro and Augmentin but was told to come the emergency room today because he will need a surgery.  Patient denies any recent injuries or falls.  Denies any significant fevers, chills, cough, vomiting, diarrhea, dysuria, rash, abdominal pain, back pain or pain in any other extremity other than some mild soreness of the top of his right foot.  States he still has sensation in his right foot.  No clearly therapeutic factors.  No proximal episodes.  States he has been compliant with all his medications.         Past Medical History:  Diagnosis Date  . CAD (coronary artery disease)   . Colon polyp   . Diabetes mellitus without complication ([place])   . Hyperlipemia   . Hypertension   . Orchitis   . PVD (peripheral vascular disease) ([place])   . Tremor     Patient Active Problem List   Diagnosis Date Noted  . 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 Cellulitis in diabetic foot ([place]) 08/20/2016    Past Surgical History:  Procedure Laterality Date  . AMPUTATION Right 08/21/2016   Procedure: RAY RESECTION-RIGHT 5TH RAY;  Surgeon: Sharlotte Alamo, DPM;  Location: [facility] ORS;  Service: Podiatry;  Laterality: Right;  . AMPUTATION TOE Left    big  . CAROTID ENDARTERECTOMY Right   . COLON SURGERY    . CORONARY ARTERY BYPASS GRAFT    . ROTATOR CUFF REPAIR Right     Prior to Admission medications    Medication Sig Start Date End Date Taking? Authorizing Provider  aspirin 81 MG tablet Take 81 mg by mouth daily.    [provider]  clopidogrel (PLAVIX) 75 MG tablet Take 75 mg by mouth daily.    [provider]  docusate sodium (COLACE) 100 MG capsule Take 1 capsule (100 mg total) by mouth 2 (two) times daily as needed for mild constipation. 08/24/16   Vaughan Basta, MD  glimepiride (AMARYL) 4 MG tablet Take 4 mg by mouth 2 (two) times daily.     [provider]  insulin glargine (LANTUS) 100 UNIT/ML injection Inject 9 Units into the skin at bedtime.     [provider]  insulin regular (NOVOLIN R,HUMULIN R) 100 units/mL injection Inject 5-10 Units into the skin 3 (three) times daily before meals.     [provider]  Magnesium 250 MG TABS Take by mouth.    [provider]  metFORMIN (GLUCOPHAGE) 1000 MG tablet Take 1,000 mg by mouth 2 (two) times daily with a meal.    [provider]  Multiple Vitamin (MULTIVITAMIN) capsule Take 1 capsule by mouth daily.    [provider]  oxyCODONE-acetaminophen (PERCOCET/ROXICET) 5-325 MG tablet Take 1 tablet by mouth every 6 (six) hours as needed for severe pain. 08/24/16   Vaughan Basta, MD  povidone-iodine (BETADINE) 10 % external solution To apply and  clean the wound 3 times a week. 08/24/16   Vaughan Basta, MD  pravastatin (PRAVACHOL) 40 MG tablet Take 40 mg by mouth daily.    [provider]  propranolol (INDERAL) 40 MG tablet Take 1 tablet (40 mg total) by mouth 3 (three) times daily. 08/24/16   Vaughan Basta, MD  ramipril (ALTACE) 10 MG capsule Take 10 mg by mouth daily.    [provider]    Allergies Patient has no known allergies.  Family History  Problem Relation Age of Onset  . Diabetes Mother   . Diabetes Father     Social History Social History   Tobacco Use  . Smoking status: Never Smoker  . Smokeless  tobacco: Never Used  Substance Use Topics  . Alcohol use: No  . Drug use: No    Review of Systems  Review of Systems  Constitutional: Negative for chills and fever.  HENT: Negative for sore throat.   Eyes: Negative for pain.  Respiratory: Negative for cough and stridor.   Cardiovascular: Negative for chest pain.  Gastrointestinal: Negative for vomiting.  Genitourinary: Negative for dysuria.  Musculoskeletal: Positive for myalgias ( R foot).  Skin: Negative for rash.  Neurological: Negative for seizures, loss of consciousness and headaches.  Psychiatric/Behavioral: Negative for suicidal ideas.  All other systems reviewed and are negative.     ____________________________________________   PHYSICAL EXAM:  VITAL SIGNS: ED Triage Vitals  Enc Vitals Group     BP 03/29/20 1512 (!) 174/112     Pulse Rate 03/29/20 1512 64     Resp 03/29/20 1514 16     Temp 03/29/20 1512 97.8 F (36.6 C)     Temp Source 03/29/20 1512 Oral     SpO2 03/29/20 1512 97 %     Weight 03/29/20 1514 220 lb (99.8 kg)     Height 03/29/20 1514 _0  (1.778 m)     Head Circumference --      Peak Flow --      Pain Score 03/29/20 1514 0     Pain Loc --      Pain Edu? --      Excl. in [place]? --    Vitals:   03/29/20 1514 03/29/20 1854  BP:  (!) 219/81  Pulse:  (!) 58  Resp: 16 18  Temp:  98.2 F (36.8 C)  SpO2:  100%   Physical Exam Vitals and nursing note reviewed.  Constitutional:      Appearance: He is well-developed.  HENT:     Head: Normocephalic and atraumatic.     Right Ear: External ear normal.     Left Ear: External ear normal.     Nose: Nose normal.  Eyes:     Conjunctiva/sclera: Conjunctivae normal.  Cardiovascular:     Rate and Rhythm: Regular rhythm. Bradycardia present.     Heart sounds: No murmur heard.   Pulmonary:     Effort: Pulmonary effort is normal. No respiratory distress.     Breath sounds: Normal breath sounds.  Abdominal:     Palpations: Abdomen is soft.      Tenderness: There is no abdominal tenderness.  Musculoskeletal:     Cervical back: Neck supple.  Skin:    General: Skin is warm and dry.     Capillary Refill: Capillary refill takes less than 2 seconds.  Neurological:     Mental Status: He is alert and oriented to person, place, and time.  Psychiatric:  Mood and Affect: Mood normal.     Patient has some erythema over the dorsum and plantar aspect of the right foot with a mildly purulent wound over the right aspect and a small punctate wound on the inferior aspect does probe to bone.  No other obvious wounds to the foot.  Patient has large motion of his ankle.  Sensation is intact light touch throughout the foot.  ____________________________________________   LABS (all labs ordered are listed, but only abnormal results are displayed)  Labs Reviewed  CBC WITH DIFFERENTIAL/PLATELET - Abnormal; Notable for the following components:      Result Value   RBC 4.07 (*)    Hemoglobin 11.8 (*)    HCT 35.3 (*)    All other components within normal limits  BASIC METABOLIC PANEL - Abnormal; Notable for the following components:   Glucose, Bld 104 (*)    All other components within normal limits  SEDIMENTATION RATE - Abnormal; Notable for the following components:   Sed Rate 68 (*)    All other components within normal limits  SARS CORONAVIRUS 2 (TAT 6-24 HRS)  CULTURE, BLOOD (ROUTINE X 2)  CULTURE, BLOOD (ROUTINE X 2)  C-REACTIVE PROTEIN   ____________________________________________  EKG  ____________________________________________  RADIOLOGY  ED MD interpretation:   Official radiology report(s): No results found.  ____________________________________________   PROCEDURES  Procedure(s) performed (including Critical Care):  .1-3 Lead EKG Interpretation Performed by: Lucrezia Starch, MD Authorized by: Lucrezia Starch, MD     Interpretation: normal     ECG rate assessment: normal     Rhythm: sinus bradycardia      Ectopy: none     Conduction: normal       ____________________________________________   INITIAL IMPRESSION / ASSESSMENT AND PLAN / ED COURSE     Patient presents with above stage III exam for assessment of wound on his right foot that he was told will need a procedure likely surgery and will likely need IV antibiotics after initially placed on p.o. antibiotics on the 17th.  He denies any change in pain notes it has been draining a little bit.  No recent injuries or falls.  On arrival he is hypertensive and mildly bradycardic with stable vital signs on room air.  The seem to be asymptomatic at this time.  Patient does have a wound as noted above is concerning for cellulitis and possible underlying osteomyelitis in the setting of diabetes and PVD.  Discussed presentation with on-call podiatrist Dr. Vickki Muff recommended admission IV antibiotics with plan for likely operative debridement and vascular consult tomorrow.  Recommend obtaining an MRI in this shoulder was placed and I will defer any plain films at this time.  CBC shows no leukocytosis hemoglobin of 10.8.  BMP shows no significant electrolyte or metabolic derangements.  We will send ESR and CRP as well.  Do not believe patient is bacteremic or septic at this time  I will plan to admit to medicine service for further evaluation management.       ____________________________________________   FINAL CLINICAL IMPRESSION(S) / ED DIAGNOSES  Final diagnoses:  Diabetic ulcer of right foot associated with diabetes mellitus due to underlying condition, limited to breakdown of skin, unspecified part of foot ([place])  Osteoarthritis of right foot, unspecified osteoarthritis type    Medications - No data to display   ED Discharge Orders    None       Note:  This document was prepared using Dragon voice recognition software and  may include unintentional dictation errors.   Lucrezia Starch, MD 03/29/20 1947    Lucrezia Starch,  MD 03/30/20 [phone]

## 2020-03-29 NOTE — ED Triage Notes (Signed)
Pt to ED via POV stating that he has a sore on his foot. Pt states that the area started bleeding. Pt spoke with his doctor who advised him to come to the ED to get his foot checked. Pt had his little toe on the same side amputated about 2 years ago. Pt is diabetic. Pt is in NAD.

## 2020-03-30 DIAGNOSIS — E1169 Type 2 diabetes mellitus with other specified complication: Secondary | ICD-10-CM | POA: Diagnosis not present

## 2020-03-30 DIAGNOSIS — E11621 Type 2 diabetes mellitus with foot ulcer: Secondary | ICD-10-CM | POA: Diagnosis not present

## 2020-03-30 DIAGNOSIS — I739 Peripheral vascular disease, unspecified: Secondary | ICD-10-CM

## 2020-03-30 DIAGNOSIS — M86171 Other acute osteomyelitis, right ankle and foot: Secondary | ICD-10-CM

## 2020-03-30 DIAGNOSIS — E785 Hyperlipidemia, unspecified: Secondary | ICD-10-CM

## 2020-03-30 DIAGNOSIS — M869 Osteomyelitis, unspecified: Secondary | ICD-10-CM

## 2020-03-30 DIAGNOSIS — L97413 Non-pressure chronic ulcer of right heel and midfoot with necrosis of muscle: Secondary | ICD-10-CM

## 2020-03-30 LAB — SARS CORONAVIRUS 2 (TAT 6-24 HRS): SARS Coronavirus 2: NEGATIVE

## 2020-03-30 LAB — CBC WITH DIFFERENTIAL/PLATELET
Abs Immature Granulocytes: 0.04 10*3/uL (ref 0.00–0.07)
Basophils Absolute: 0 10*3/uL (ref 0.0–0.1)
Basophils Relative: 0 %
Eosinophils Absolute: 0.3 10*3/uL (ref 0.0–0.5)
Eosinophils Relative: 6 %
HCT: 32.7 % — ABNORMAL LOW (ref 39.0–52.0)
Hemoglobin: 11.6 g/dL — ABNORMAL LOW (ref 13.0–17.0)
Immature Granulocytes: 1 %
Lymphocytes Relative: 20 %
Lymphs Abs: 1 10*3/uL (ref 0.7–4.0)
MCH: 30.1 pg (ref 26.0–34.0)
MCHC: 35.5 g/dL (ref 30.0–36.0)
MCV: 84.7 fL (ref 80.0–100.0)
Monocytes Absolute: 0.6 10*3/uL (ref 0.1–1.0)
Monocytes Relative: 11 %
Neutro Abs: 3 10*3/uL (ref 1.7–7.7)
Neutrophils Relative %: 62 %
Platelets: 259 10*3/uL (ref 150–400)
RBC: 3.86 MIL/uL — ABNORMAL LOW (ref 4.22–5.81)
RDW: 11.8 % (ref 11.5–15.5)
WBC: 4.9 10*3/uL (ref 4.0–10.5)
nRBC: 0 % (ref 0.0–0.2)

## 2020-03-30 LAB — PREALBUMIN: Prealbumin: 8.1 mg/dL — ABNORMAL LOW (ref 18–38)

## 2020-03-30 LAB — COMPREHENSIVE METABOLIC PANEL
ALT: 19 U/L (ref 0–44)
AST: 19 U/L (ref 15–41)
Albumin: 2.9 g/dL — ABNORMAL LOW (ref 3.5–5.0)
Alkaline Phosphatase: 75 U/L (ref 38–126)
Anion gap: 7 (ref 5–15)
BUN: 17 mg/dL (ref 8–23)
CO2: 23 mmol/L (ref 22–32)
Calcium: 9 mg/dL (ref 8.9–10.3)
Chloride: 107 mmol/L (ref 98–111)
Creatinine, Ser: 0.73 mg/dL (ref 0.61–1.24)
GFR, Estimated: >60 mL/min (ref >60)
Glucose, Bld: 197 mg/dL — ABNORMAL HIGH (ref 70–99)
Potassium: 3.7 mmol/L (ref 3.5–5.1)
Sodium: 137 mmol/L (ref 135–145)
Total Bilirubin: 0.7 mg/dL (ref 0.3–1.2)
Total Protein: 6.8 g/dL (ref 6.5–8.1)

## 2020-03-30 LAB — MAGNESIUM: Magnesium: 1.5 mg/dL — ABNORMAL LOW (ref 1.7–2.4)

## 2020-03-30 LAB — GLUCOSE, CAPILLARY
Glucose-Capillary: 190 mg/dL — ABNORMAL HIGH (ref 70–99)
Glucose-Capillary: 192 mg/dL — ABNORMAL HIGH (ref 70–99)
Glucose-Capillary: 255 mg/dL — ABNORMAL HIGH (ref 70–99)
Glucose-Capillary: 181 mg/dL — ABNORMAL HIGH (ref 70–99)
Glucose-Capillary: 200 mg/dL — ABNORMAL HIGH (ref 70–99)

## 2020-03-30 LAB — C-REACTIVE PROTEIN: CRP: 6 mg/dL — ABNORMAL HIGH (ref <1.0)

## 2020-03-30 LAB — TSH: TSH: 1.004 u[IU]/mL (ref 0.350–4.500)

## 2020-03-30 LAB — HEMOGLOBIN A1C
Hgb A1c MFr Bld: 8.2 % — ABNORMAL HIGH (ref 4.8–5.6)
Mean Plasma Glucose: 188.64 mg/dL

## 2020-03-30 LAB — PHOSPHORUS: Phosphorus: 2.9 mg/dL (ref 2.5–4.6)

## 2020-03-30 MED ORDER — MAGNESIUM SULFATE 2 GM/50ML IV SOLN
2.0000 g | Freq: Once | INTRAVENOUS | Status: AC
  Administered 2020-03-30: 2 g via INTRAVENOUS
  Filled 2020-03-30: qty 50

## 2020-03-30 MED ORDER — VANCOMYCIN HCL 1250 MG/250ML IV SOLN
1250.0000 mg | Freq: Two times a day (BID) | INTRAVENOUS | Status: DC
  Administered 2020-03-30 – 2020-04-03 (×9): 1250 mg via INTRAVENOUS
  Filled 2020-03-30 (×10): qty 250

## 2020-03-30 NOTE — Consult Note (Signed)
[facility] [place]  Patient name: Jason [name] MRN: [id number] DOB: 1945/04/28 Sex: male   REQUESTING PROVIDER:    Dr. Ether Griffins   REASON FOR CONSULT:    PAD with ulcer  HISTORY OF PRESENT ILLNESS:   Jason [name] is a 75 y.o. male, who was admitted to the hospital for worsening drainage and redness around his right foot wound.  He was admitted for IV antibiotics.  In the podiatry clinic on Friday, a deep wound culture was performed.  Osteomyelitis was suspected.[name]  He has previously undergone right fifth toe amputation.  The patient is a d diabetic with peripheral neuropathy.  He has a history of coronary artery disease.  He is medically managed for hypertension.  He is a non-smoker.  He is on a statin for hypercholesterolemia.  PAST MEDICAL HISTORY    Past Medical History:  Diagnosis Date  . CAD (coronary artery disease)   . Colon polyp   . Diabetes mellitus without complication ([facility])   . Hyperlipemia   . Hypertension   . Orchitis   . PVD (peripheral vascular disease) ([facility])   . Tremor      FAMILY HISTORY   Family History  Problem Relation Age of Onset  . Diabetes Mother   . Diabetes Father     SOCIAL HISTORY:   Social History   Socioeconomic History  . Marital status: Married    Spouse name: Not on file  . Number of children: Not on file  . Years of education: Not on file  . Highest education level: Not on file  Occupational History  . Occupation: IT sales professional  Tobacco Use  . Smoking status: Never Smoker  . Smokeless tobacco: Never Used  Substance and Sexual Activity  . Alcohol use: No  . Drug use: No  . Sexual activity: Not on file  Other Topics Concern  . Not on file  Social History Narrative  . Not on file   Social Determinants of Health   Financial Resource Strain: Not on file  Food Insecurity: Not on file  Transportation Needs: Not on file  Physical Activity: Not on file   Stress: Not on file  Social Connections: Not on file  Intimate Partner Violence: Not on file    ALLERGIES:    No Known Allergies  CURRENT MEDICATIONS:    Current Facility-Administered Medications  Medication Dose Route Frequency Provider Last Rate Last Admin  . acetaminophen (TYLENOL) tablet 650 mg  650 mg Oral Q6H PRN Therisa Doyne, MD       Or  . acetaminophen (TYLENOL) suppository 650 mg  650 mg Rectal Q6H PRN Doutova, Anastassia, MD      . ceFEPIme (MAXIPIME) 2 g in sodium chloride 0.9 % 100 mL IVPB  2 g Intravenous Q8H Doutova, Anastassia, MD 200 mL/hr at 03/30/20 0735 2 g at 03/30/20 0735  . HYDROcodone-acetaminophen (NORCO/VICODIN) 5-325 MG per tablet 1-2 tablet  1-2 tablet Oral Q4H PRN Doutova, Anastassia, MD      . insulin aspart (novoLOG) injection 0-9 Units  0-9 Units Subcutaneous Q4H Therisa Doyne, MD   5 Units at 03/30/20 1212  . insulin glargine (LANTUS) injection 5 Units  5 Units Subcutaneous QHS Doutova, Anastassia, MD      . pravastatin (PRAVACHOL) tablet 40 mg  40 mg Oral Daily Doutova, Anastassia, MD   40 mg at 03/30/20 0939  . propranolol (INDERAL) tablet 40 mg  40 mg Oral TID Therisa Doyne, MD   40 mg  at 03/30/20 0939  . ramipril (ALTACE) capsule 10 mg  10 mg Oral Daily Doutova, Anastassia, MD   10 mg at 03/30/20 0939  . vancomycin (VANCOREADY) IVPB 1250 mg/250 mL  1,250 mg Intravenous Q12H Wieting, Richard, MD 166.7 mL/hr at 03/30/20 1041 1,250 mg at 03/30/20 1041    REVIEW OF SYSTEMS:   X denotes positive finding, denotes negative finding Cardiac  Comments:  Chest pain or chest pressure:    Shortness of breath upon exertion:    Short of breath when lying flat:    Irregular heart rhythm:        Vascular    Pain in calf, thigh, or hip brought on by ambulation:    Pain in feet at night that wakes you up from your sleep:     Blood clot in your veins:    Leg swelling:         Pulmonary    Oxygen at home:    Productive cough:      Wheezing:         Neurologic    Sudden weakness in arms or legs:     Sudden numbness in arms or legs:     Sudden onset of difficulty speaking or slurred speech:    Temporary loss of vision in one eye:     Problems with dizziness:         Gastrointestinal    Blood in stool:      Vomited blood:         Genitourinary    Burning when urinating:     Blood in urine:        Psychiatric    Major depression:         Hematologic    Bleeding problems:    Problems with blood clotting too easily:        Skin    Rashes or ulcers:        Constitutional    Fever or chills:     PHYSICAL EXAM:   Vitals:   03/29/20 2315 03/30/20 0640 03/30/20 0801 03/30/20 1137  BP: (!) 173/91 (!) 160/75 (!) 162/76 (!) 189/76  Pulse: 69 79 (!) 59 60  Resp: 19 18 17 18  Temp: 97.9 F (36.6 C) 98.8 F (37.1 C) 98.5 F (36.9 C) 98 F (36.7 C)  TempSrc: Oral Oral Oral Oral  SpO2: 100% 94% 97% 97%  Weight: 96.4 kg     Height: 5' 10" (1.778 m)       GENERAL: The patient is a well-nourished male, in no acute distress. The vital signs are documented above. CARDIAC: There is a regular rate and rhythm.  VASCULAR: Palpable femoral pulses, nonpalpable pedal pulses PULMONARY: Nonlabored respirations ABDOMEN: Soft and non-tender with normal pitched bowel sounds.  MUSCULOSKELETAL: There are no major deformities or cyanosis. NEUROLOGIC: No focal weakness or paresthesias are detected. SKIN: Right foot wound is covered up by dressing.  See photo below PSYCHIATRIC: The patient has a normal affect.    STUDIES:   I have reviewed the following duplex: Inflow: Normal common femoral arterial waveforms and velocities. No evidence of inflow (aortoiliac) disease. Scattered nonocclusive plaque.  Outflow: Normal profunda femoral, and proximal superficial femoral arterial waveforms and velocities. Monophasic waveforms in the distal SFA and popliteal artery. No focal elevation of the PSV to suggest stenosis.  Scattered nonocclusive plaque.  Runoff: Occlusion in the proximal/mid posterior tibial artery, reconstituted distally with low velocity monophasic waveform. Patent anterior tibial artery with monophasic waveform.    IMPRESSION: Occlusion in the right posterior tibial artery, reconstituted distally. Correlate with ABIs.  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  I think he would benefit from angiography to define his anatomy and possibly intervene in an effort to help with wound healing.  I discussed the details of the procedure as well as the risks and benefits.  We will add him onto the schedule for tomorrow.  He will be n.p.o. after midnight.   Charlena Cross, MD, FACS [facility] [phone] Pager [phone]

## 2020-03-30 NOTE — Progress Notes (Signed)
Pharmacy Antibiotic Note  Jason [name] is a 75 y.o. male admitted on 03/29/2020 with cellulitis of R foot .  Pharmacy has been consulted for vancomycin and cefepime dosing.  Pt afebrile, WBC WNL  Plan: Cefepime 2g IV q8h  Vancomycin 2g bolus then vanc 1500 mg IV q24H. Adjusted to 1250 mg q12H with a predicted AUC of 487. Goal AUC of 400-550. (used Vd 0.72 and Scr 0.8 for calculation). Plan to obtain vancomycin level in the next 2-3 days.     Height: 5\' 10"  (177.8 cm) Weight: 96.4 kg (212 lb 8.4 oz) IBW/kg (Calculated) : 73  Temp (24hrs), Avg:98.2 F (36.8 C), Min:97.8 F (36.6 C), Max:98.8 F (37.1 C)  Recent Labs  Lab 03/29/20 1616 03/30/20 0515  WBC 6.2 4.9  CREATININE 0.87 0.73    Estimated Creatinine Clearance: 94.4 mL/min (by C-G formula based on SCr of 0.73 mg/dL).    No Known Allergies  Antimicrobials this admission: Vancomycin 3/19> Cefepime 3/19> (cipro and augmentin prior to admission)  Microbiology results: 3/19 BCx in process    4/19, PharmD Clinical Pharmacist  03/30/2020   10:13 AM

## 2020-03-30 NOTE — Progress Notes (Addendum)
Patient ID: Jason [name], male   DOB: [birth year], 75 y.o.   MRN: [id number] [facility] PROGRESS NOTE  Jason [name] HRC:[id number] DOB: 1945-05-27 DOA: 03/29/2020 PCP: Marina Goodell, MD  HPI/Subjective: Patient came in with draining foot ulcer.  Has some pain on his foot.  MRI consistent with osteomyelitis.  Objective: Vitals:   03/30/20 0801 03/30/20 1137  BP: (!) 162/76 (!) 189/76  Pulse: (!) 59 60  Resp: 17 18  Temp: 98.5 F (36.9 C) 98 F (36.7 C)  SpO2: 97% 97%    Intake/Output Summary (Last 24 hours) at 03/30/2020 1420 Last data filed at 03/30/2020 1353 Gross per 24 hour  Intake 1563.91 ml  Output 300 ml  Net 1263.91 ml   Filed Weights   03/29/20 1514 03/29/20 2315  Weight: 99.8 kg 96.4 kg    ROS: Review of Systems  Respiratory: Negative for shortness of breath.   Cardiovascular: Negative for chest pain.  Gastrointestinal: Negative for abdominal pain, nausea and vomiting.  Musculoskeletal: Positive for joint pain.   Exam: Physical Exam HENT:     Head: Normocephalic.     Mouth/Throat:     Pharynx: No oropharyngeal exudate.  Eyes:     General: Lids are normal.     Conjunctiva/sclera: Conjunctivae normal.  Cardiovascular:     Rate and Rhythm: Normal rate and regular rhythm.     Heart sounds: Normal heart sounds, S1 normal and S2 normal.  Pulmonary:     Breath sounds: Normal breath sounds. No decreased breath sounds, wheezing, rhonchi or rales.  Abdominal:     Palpations: Abdomen is soft.     Tenderness: There is no abdominal tenderness.  Musculoskeletal:     Right lower leg: No swelling.     Left lower leg: No swelling.  Skin:    General: Skin is warm.     Comments: Right foot covered by pictures reviewed.  Neurological:     Mental Status: He is alert and oriented to person, place, and time.       Data Reviewed: Basic Metabolic Panel: Recent Labs  Lab 03/29/20 1616 03/30/20 0515  NA 137 137  K 3.7 3.7  CL 103 107  CO2 26 23   GLUCOSE 104* 197*  BUN 22 17  CREATININE 0.87 0.73  CALCIUM 9.7 9.0  MG 1.5* 1.5*  PHOS  --  2.9   Liver Function Tests: Recent Labs  Lab 03/29/20 1616 03/30/20 0515  AST 22 19  ALT 22 19  ALKPHOS 79 75  BILITOT 0.7 0.7  PROT 7.6 6.8  ALBUMIN 3.3* 2.9*   CBC: Recent Labs  Lab 03/29/20 1616 03/30/20 0515  WBC 6.2 4.9  NEUTROABS 4.0 3.0  HGB 11.8* 11.6*  HCT 35.3* 32.7*  MCV 86.7 84.7  PLT 286 259    CBG: Recent Labs  Lab 03/29/20 2039 03/29/20 2346 03/30/20 0445 03/30/20 0803 03/30/20 1143  GLUCAP 56* 194* 190* 192* 255*    Recent Results (from the past 240 hour(s))  SARS CORONAVIRUS 2 (TAT 6-24 HRS) Nasopharyngeal Nasopharyngeal Swab     Status: None   Collection Time: 03/29/20  7:41 PM   Specimen: Nasopharyngeal Swab  Result Value Ref Range Status   SARS Coronavirus 2 NEGATIVE NEGATIVE Final    Comment: (NOTE) SARS-CoV-2 target nucleic acids are NOT DETECTED.  The SARS-CoV-2 RNA is generally detectable in upper and lower respiratory specimens during the acute phase of infection. Negative results do not preclude SARS-CoV-2 infection, do not rule out co-infections  with other pathogens, and should not be used as the sole basis for treatment or other patient management decisions. Negative results must be combined with clinical observations, patient history, and epidemiological information. The expected result is Negative.  Fact Sheet for Patients: HairSlick.no  Fact Sheet for Healthcare Providers: quierodirigir.com  This test is not yet approved or cleared by the [place] FDA and  has been authorized for detection and/or diagnosis of SARS-CoV-2 by FDA under an Emergency Use Authorization (EUA). This EUA will remain  in effect (meaning this test can be used) for the duration of the COVID-19 declaration under Se ction 564(b)(1) of the Act, 21 U.S.C. section 360bbb-3(b)(1), unless the  authorization is terminated or revoked sooner.  Performed at [facility], [address], [place], [place] [address]   Blood culture (routine x 2)     Status: None (Preliminary result)   Collection Time: 03/29/20  7:41 PM   Specimen: BLOOD  Result Value Ref Range Status   Specimen Description BLOOD LEFT ANTECUBITAL  Final   Special Requests   Final    BOTTLES DRAWN AEROBIC AND ANAEROBIC Blood Culture results may not be optimal due to an inadequate volume of blood received in culture bottles   Culture   Final    NO GROWTH < 12 HOURS Performed at [facility], [address] [place], [place] [address]    Report Status PENDING  Incomplete  Blood culture (routine x 2)     Status: None (Preliminary result)   Collection Time: 03/29/20  7:41 PM   Specimen: BLOOD  Result Value Ref Range Status   Specimen Description BLOOD BLOOD RIGHT FOREARM  Final   Special Requests   Final    BOTTLES DRAWN AEROBIC AND ANAEROBIC Blood Culture adequate volume   Culture   Final    NO GROWTH < 12 HOURS Performed at [facility], [address], [place], [place] [address]    Report Status PENDING  Incomplete     Studies: MR FOOT RIGHT WO CONTRAST  Result Date: 03/30/2020 CLINICAL DATA:  Chronic right foot wound.  Pain.  Diabetic patient. EXAM: MRI OF THE RIGHT FOREFOOT WITHOUT CONTRAST TECHNIQUE: Multiplanar, multisequence MR imaging of the right forefoot was performed. No intravenous contrast was administered. COMPARISON:  Plain films right foot 03/16/2015. FINDINGS: Bones/Joint/Cartilage The patient has undergone amputation at the level of the proximal and middle thirds of the diaphysis of the fifth metatarsal since the prior exam. There is marrow edema throughout the fourth metatarsal, fourth toe, third metatarsal and majority of the proximal phalanx of the third toe. The middle phalanx of the third toe is spared but there is edema in the distal phalanx of the third toe. Edema  is also seen in the cuboid and in the proximal 2.4 cm of the fifth metatarsal remnant. There is osseous fusion across the cuboid and fourth and fifth metatarsals. Bone marrow signal is otherwise normal. Midfoot neuropathic change noted Ligaments Grossly intact. Muscles and Tendons Severe fatty atrophy of intrinsic musculature the foot. Soft tissues Skin wound is seen along the lateral aspect of the patient's stump. Scattered locules of soft tissue gas predominant about the lateral aspect of the foot are noted. There may also be a skin wound on the plantar surface of the foot deep to the distal fourth metatarsal. No abscess is identified. IMPRESSION: Findings consistent with osteomyelitis in the cuboid, proximal 2.4 cm of the fifth metatarsal remnant, throughout the third and fourth metatarsals, throughout  the fourth toe and in the proximal and distal phalanges of the third toe. Negative for abscess or osteomyelitis with skin wounds and small foci of soft tissue gas in the lateral aspect of the foot noted. Electronically Signed   By: Drusilla Kanner M.D.   On: 03/30/2020 09:17   US ARTERIAL LOWER EXTREMITY DUPLEX RIGHT(NON-ABI)  Result Date: 03/30/2020 CLINICAL DATA:  Diabetic foot ulcer. Previous toe amputations. Hypertension, hyperlipidemia, coronary artery disease, diabetes EXAM: RIGHT LOWER EXTREMITY ARTERIAL DUPLEX SCAN TECHNIQUE: Gray-scale sonography as well as color Doppler and duplex ultrasound was performed to evaluate the lower extremity arteries including the common, superficial and profunda femoral arteries, popliteal artery and calf arteries. COMPARISON:  None. FINDINGS: Right lower Extremity ABI: Not calculated Inflow: Normal common femoral arterial waveforms and velocities. No evidence of inflow (aortoiliac) disease. Scattered nonocclusive plaque. Outflow: Normal profunda femoral, and proximal superficial femoral arterial waveforms and velocities. Monophasic waveforms in the distal SFA and  popliteal artery. No focal elevation of the PSV to suggest stenosis. Scattered nonocclusive plaque. Runoff: Occlusion in the proximal/mid posterior tibial artery, reconstituted distally with low velocity monophasic waveform. Patent anterior tibial artery with monophasic waveform. IMPRESSION: Occlusion in the right posterior tibial artery, reconstituted distally. Correlate with ABIs. Electronically Signed   By: Corlis Leak M.D.   On: 03/30/2020 08:31    Scheduled Meds: . insulin aspart  0-9 Units Subcutaneous Q4H  . insulin glargine  5 Units Subcutaneous QHS  . pravastatin  40 mg Oral Daily  . propranolol  40 mg Oral TID  . ramipril  10 mg Oral Daily   Continuous Infusions: . ceFEPime (MAXIPIME) IV 2 g (03/30/20 1338)  . vancomycin 1,250 mg (03/30/20 1041)    Assessment/Plan:  1. Acute osteomyelitis right foot cuboid bone, fifth metatarsal remnant, third and fourth metatarsals, fourth toe and proximal third toe.  Diabetic foot ulcer on the lateral right foot with muscle exposure.  Case discussed with podiatry.  Continue IV antibiotics.  Podiatry asked vascular surgery to do an angiogram.  Likely will need a amputation of infected bone. 2. Type 2 diabetes mellitus with hyperlipidemia.  Continue pravastatin.  Continue Lantus insulin at bedtime and sliding scale.  Hold Amaryl and Glucophage.  Patient had a low sugar last night of 56.  Continue to watch sugars closely. 3. Peripheral vascular disease.  Holding aspirin and Plavix for likely amputation. 4. Essential hypertension on Altace and propranolol     Code Status:     Code Status Orders  (From admission, onward)         Start     Ordered   03/29/20 2341  Full code  Continuous        03/29/20 2340        Code Status History    Date Active Date Inactive Code Status Order ID Comments User Context   08/20/2016 1324 08/24/2016 2106 Full Code [id number]  Altamese Dilling, MD Inpatient   Advance Care Planning Activity     Family  Communication: Spoke with Sue Lush on the phone Disposition Plan: Status is: Inpatient  Dispo: The patient is from: Home              Anticipated d/c is to: Home              Patient currently on IV antibiotics for osteomyelitis will need amputation prior to disposition   Difficult to place patient.  No.  Consultants:  Podiatry  Vascular surgery  Antibiotics:  Vancomycin  Cefepime  Time  spent: 28 minutes  Graig Hessling [employer]  [facility]

## 2020-03-30 NOTE — H&P (View-Only) (Signed)
[facility]  Patient name: Jason [name] MRN: [id number] DOB: 1945/04/28 Sex: male   REQUESTING PROVIDER:    Dr. Ether Griffins   REASON FOR CONSULT:    PAD with ulcer  HISTORY OF PRESENT ILLNESS:   Jason [name] is a 75 y.o. male, who was admitted to the hospital for worsening drainage and redness around his right foot wound.  He was admitted for IV antibiotics.  In the podiatry clinic on Friday, a deep wound culture was performed.  Osteomyelitis was suspected.[name]  He has previously undergone right fifth toe amputation.  The patient is a d diabetic with peripheral neuropathy.  He has a history of coronary artery disease.  He is medically managed for hypertension.  He is a non-smoker.  He is on a statin for hypercholesterolemia.  PAST MEDICAL HISTORY    Past Medical History:  Diagnosis Date  . CAD (coronary artery disease)   . Colon polyp   . Diabetes mellitus without complication (HCC)   . Hyperlipemia   . Hypertension   . Orchitis   . PVD (peripheral vascular disease) (HCC)   . Tremor      FAMILY HISTORY   Family History  Problem Relation Age of Onset  . Diabetes Mother   . Diabetes Father     SOCIAL HISTORY:   Social History   Socioeconomic History  . Marital status: Married    Spouse name: Not on file  . Number of children: Not on file  . Years of education: Not on file  . Highest education level: Not on file  Occupational History  . Occupation: IT sales professional  Tobacco Use  . Smoking status: Never Smoker  . Smokeless tobacco: Never Used  Substance and Sexual Activity  . Alcohol use: No  . Drug use: No  . Sexual activity: Not on file  Other Topics Concern  . Not on file  Social History Narrative  . Not on file   Social Determinants of Health   Financial Resource Strain: Not on file  Food Insecurity: Not on file  Transportation Needs: Not on file  Physical Activity: Not on file   Stress: Not on file  Social Connections: Not on file  Intimate Partner Violence: Not on file    ALLERGIES:    No Known Allergies  CURRENT MEDICATIONS:    Current Facility-Administered Medications  Medication Dose Route Frequency Provider Last Rate Last Admin  . acetaminophen (TYLENOL) tablet 650 mg  650 mg Oral Q6H PRN Therisa Doyne, MD       Or  . acetaminophen (TYLENOL) suppository 650 mg  650 mg Rectal Q6H PRN Doutova, Anastassia, MD      . ceFEPIme (MAXIPIME) 2 g in sodium chloride 0.9 % 100 mL IVPB  2 g Intravenous Q8H Doutova, Anastassia, MD 200 mL/hr at 03/30/20 0735 2 g at 03/30/20 0735  . HYDROcodone-acetaminophen (NORCO/VICODIN) 5-325 MG per tablet 1-2 tablet  1-2 tablet Oral Q4H PRN Doutova, Anastassia, MD      . insulin aspart (novoLOG) injection 0-9 Units  0-9 Units Subcutaneous Q4H Therisa Doyne, MD   5 Units at 03/30/20 1212  . insulin glargine (LANTUS) injection 5 Units  5 Units Subcutaneous QHS Doutova, Anastassia, MD      . pravastatin (PRAVACHOL) tablet 40 mg  40 mg Oral Daily Doutova, Anastassia, MD   40 mg at 03/30/20 0939  . propranolol (INDERAL) tablet 40 mg  40 mg Oral TID Therisa Doyne, MD   40 mg  at 03/30/20 0939  . ramipril (ALTACE) capsule 10 mg  10 mg Oral Daily Doutova, Anastassia, MD   10 mg at 03/30/20 0939  . vancomycin (VANCOREADY) IVPB 1250 mg/250 mL  1,250 mg Intravenous Q12H Alford Highland, MD 166.7 mL/hr at 03/30/20 1041 1,250 mg at 03/30/20 1041    REVIEW OF SYSTEMS:   X denotes positive finding, denotes negative finding Cardiac  Comments:  Chest pain or chest pressure:    Shortness of breath upon exertion:    Short of breath when lying flat:    Irregular heart rhythm:        Vascular    Pain in calf, thigh, or hip brought on by ambulation:    Pain in feet at night that wakes you up from your sleep:     Blood clot in your veins:    Leg swelling:         Pulmonary    Oxygen at home:    Productive cough:      Wheezing:         Neurologic    Sudden weakness in arms or legs:     Sudden numbness in arms or legs:     Sudden onset of difficulty speaking or slurred speech:    Temporary loss of vision in one eye:     Problems with dizziness:         Gastrointestinal    Blood in stool:      Vomited blood:         Genitourinary    Burning when urinating:     Blood in urine:        Psychiatric    Major depression:         Hematologic    Bleeding problems:    Problems with blood clotting too easily:        Skin    Rashes or ulcers:        Constitutional    Fever or chills:     PHYSICAL EXAM:   Vitals:   03/29/20 2315 03/30/20 0640 03/30/20 0801 03/30/20 1137  BP: (!) 173/91 (!) 160/75 (!) 162/76 (!) 189/76  Pulse: 69 79 (!) 59 60  Resp: 19 18 17 18   Temp: 97.9 F (36.6 C) 98.8 F (37.1 C) 98.5 F (36.9 C) 98 F (36.7 C)  TempSrc: Oral Oral Oral Oral  SpO2: 100% 94% 97% 97%  Weight: 96.4 kg     Height: 5\' 10"  (1.778 m)       GENERAL: The patient is a well-nourished male, in no acute distress. The vital signs are documented above. CARDIAC: There is a regular rate and rhythm.  VASCULAR: Palpable femoral pulses, nonpalpable pedal pulses PULMONARY: Nonlabored respirations ABDOMEN: Soft and non-tender with normal pitched bowel sounds.  MUSCULOSKELETAL: There are no major deformities or cyanosis. NEUROLOGIC: No focal weakness or paresthesias are detected. SKIN: Right foot wound is covered up by dressing.  See photo below PSYCHIATRIC: The patient has a normal affect.    STUDIES:   I have reviewed the following duplex: Inflow: Normal common femoral arterial waveforms and velocities. No evidence of inflow (aortoiliac) disease. Scattered nonocclusive plaque.  Outflow: Normal profunda femoral, and proximal superficial femoral arterial waveforms and velocities. Monophasic waveforms in the distal SFA and popliteal artery. No focal elevation of the PSV to suggest stenosis.  Scattered nonocclusive plaque.  Runoff: Occlusion in the proximal/mid posterior tibial artery, reconstituted distally with low velocity monophasic waveform. Patent anterior tibial artery with monophasic waveform.  IMPRESSION: Occlusion in the right posterior tibial artery, reconstituted distally. Correlate with ABIs.  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  I think he would benefit from angiography to define his anatomy and possibly intervene in an effort to help with wound healing.  I discussed the details of the procedure as well as the risks and benefits.  We will add him onto the schedule for tomorrow.  He will be n.p.o. after midnight.   Charlena Cross, MD, FACS [facility] [phone] Pager [phone]

## 2020-03-30 NOTE — Evaluation (Signed)
Physical Therapy Evaluation Patient Details Name: Jason [name] MRN: [id number] DOB: 12-13-1945 Today's Date: 03/30/2020   History of Present Illness  presented to ER secondary to progressive bleeding, pain to R foot wound; admitted for management of cellulitis, non-healing diabetic ulcer.  Clinical Impression  Patient seated edge of bed upon arrival to session.  Alert and oriented, follows commands and agreeable to participation with session.  Independently dons bilat shoes (R heel wedge, L diabetic shoe) with good trunk control, dynamic sitting balance.  Completes sit/stand, basic transfers and gait (40') with RW, mod indep.  Demonstrates 3-point, step to gait pattern leading with R LE; min cuing to decreased L step length to maximize heel WBing R LE.  Good gait mechanics, safety awareness and overall control with gait efforts. Appears to be at baseline level of functional ability without need for acute PT intervention.  Do recommend continued mobility efforts throughout remaining hospital stay to maintain strength/endurance.  Patient voiced agreement and understanding.  Will complete initial order at this time; please re-consult should needs change.    Follow Up Recommendations No PT follow up    Equipment Recommendations       Recommendations for Other Services       Precautions / Restrictions Precautions Precautions: Fall Precaution Comments: R heel-wedge shoe in room Restrictions Weight Bearing Restrictions: No      Mobility  Bed Mobility               General bed mobility comments: seated edge of bed beginning/end of treatment session    Transfers Overall transfer level: Needs assistance Equipment used: Rolling walker (2 wheeled) Transfers: Sit to/from Stand Sit to Stand: Modified independent (Device/Increase time)         General transfer comment: good hand placement, safety awareness with transfers  Ambulation/Gait Ambulation/Gait assistance: Modified  independent (Device/Increase time) Gait Distance (Feet): 40 Feet Assistive device: Rolling walker (2 wheeled)       General Gait Details: 3-point, step to gait pattern leading with R LE; min cuing to decreased L step length to maximize heel WBing R LE.  Good gait mechanics, safety awareness and overall control with gait efforts.  Stairs            Wheelchair Mobility    Modified Rankin (Stroke Patients Only)       Balance Overall balance assessment: Needs assistance Sitting-balance support: No upper extremity supported;Feet supported Sitting balance-Leahy Scale: Good     Standing balance support: Bilateral upper extremity supported Standing balance-Leahy Scale: Good                               Pertinent Vitals/Pain Pain Assessment: No/denies pain    Home Living Family/patient expects to be discharged to:: Private residence Living Arrangements: Children Available Help at Discharge: Family;Available PRN/intermittently Type of Home: House       Home Layout: Multi-level;Able to live on main level with bedroom/bathroom Home Equipment: Dan Humphreys - 2 wheels      Prior Function Level of Independence: Independent         Comments: Indep with ADLs, household and community mobilization; working part-time as Holiday representative at [employer]        Extremity/Trunk Assessment   Upper Extremity Assessment Upper Extremity Assessment: Overall WFL for tasks assessed    Lower Extremity Assessment Lower Extremity Assessment: Overall WFL for tasks assessed (grossly 4+/5 throughout; R foot wrapped in dressing/ace  wrap)       Communication   Communication: No difficulties  Cognition Arousal/Alertness: Awake/alert Behavior During Therapy: WFL for tasks assessed/performed Overall Cognitive Status: Within Functional Limits for tasks assessed                                        General Comments      Exercises      Assessment/Plan    PT Assessment Patent does not need any further PT services  PT Problem List         PT Treatment Interventions      PT Goals (Current goals can be found in the Care Plan section)  Acute Rehab PT Goals Patient Stated Goal: to go home PT Goal Formulation: All assessment and education complete, DC therapy Time For Goal Achievement: 03/30/20 Potential to Achieve Goals: Good    Frequency     Barriers to discharge        Co-evaluation               AM-PAC PT "6 Clicks" Mobility  Outcome Measure Help needed turning from your back to your side while in a flat bed without using bedrails?: None Help needed moving from lying on your back to sitting on the side of a flat bed without using bedrails?: None Help needed moving to and from a bed to a chair (including a wheelchair)?: None Help needed standing up from a chair using your arms (e.g., wheelchair or bedside chair)?: None Help needed to walk in hospital room?: None Help needed climbing 3-5 steps with a railing? : None 6 Click Score: 24    End of Session   Activity Tolerance: Patient tolerated treatment well Patient left: in bed;with call bell/phone within reach Nurse Communication: Mobility status PT Visit Diagnosis: Difficulty in walking, not elsewhere classified (R26.2)    Time: 7121-9758 PT Time Calculation (min) (ACUTE ONLY): 14 min   Charges:   PT Evaluation $PT Eval Low Complexity: 1 Low        Eian Vandervelden H. Manson Passey, PT, DPT, NCS 03/30/20, 3:08 PM [phone]

## 2020-03-30 NOTE — Consult Note (Signed)
ORTHOPAEDIC CONSULTATION  REQUESTING PHYSICIAN: Alford Highland, MD  Chief Complaint: Right foot ulcer  HPI: Jason [name] is a 75 y.o. male who complains of worsening drainage redness from wound to his right foot.  Seen in the outpatient clinic this past Friday.  Had an area that probed down to bone.  A deep wound culture was performed at that time.  Suspect osteomyelitis to this area.  Recommended admission for IV antibiotics and debridement.  Patient well-known to the vascular team.  He has nonpalpable pulses.  Gust with vascular surgery need for angio.  They will hopefully be able to add on for Monday.  We recommended the patient go on oral antibiotics for a couple of days and be admitted over the weekend for possible angio on Monday.  History of previous surgery to this right foot with fifth toe amputation and ray amputation.  He is diabetic with neuropathy.  Denies any fever or chills at this time.  No pain secondary to neuropathy.  Past Medical History:  Diagnosis Date  . CAD (coronary artery disease)   . Colon polyp   . Diabetes mellitus without complication (HCC)   . Hyperlipemia   . Hypertension   . Orchitis   . PVD (peripheral vascular disease) (HCC)   . Tremor    Past Surgical History:  Procedure Laterality Date  . AMPUTATION Right 08/21/2016   Procedure: RAY RESECTION-RIGHT 5TH RAY;  Surgeon: Linus Galas, DPM;  Location: [facility] ORS;  Service: Podiatry;  Laterality: Right;  . AMPUTATION TOE Left    big  . CAROTID ENDARTERECTOMY Right   . COLON SURGERY    . CORONARY ARTERY BYPASS GRAFT    . ROTATOR CUFF REPAIR Right    Social History   Socioeconomic History  . Marital status: Married    Spouse name: Not on file  . Number of children: Not on file  . Years of education: Not on file  . Highest education level: Not on file  Occupational History  . Occupation: IT sales professional  Tobacco Use  . Smoking status: Never Smoker  . Smokeless tobacco: Never Used  Substance  and Sexual Activity  . Alcohol use: No  . Drug use: No  . Sexual activity: Not on file  Other Topics Concern  . Not on file  Social History Narrative  . Not on file   Social Determinants of Health   Financial Resource Strain: Not on file  Food Insecurity: Not on file  Transportation Needs: Not on file  Physical Activity: Not on file  Stress: Not on file  Social Connections: Not on file   Family History  Problem Relation Age of Onset  . Diabetes Mother   . Diabetes Father    No Known Allergies Prior to Admission medications   Medication Sig Start Date End Date Taking? Authorizing Provider  aspirin 81 MG tablet Take 81 mg by mouth daily.    [provider]  clopidogrel (PLAVIX) 75 MG tablet Take 75 mg by mouth daily.    [provider]  docusate sodium (COLACE) 100 MG capsule Take 1 capsule (100 mg total) by mouth 2 (two) times daily as needed for mild constipation. 08/24/16   Altamese Dilling, MD  glimepiride (AMARYL) 4 MG tablet Take 4 mg by mouth 2 (two) times daily.     [provider]  insulin glargine (LANTUS) 100 UNIT/ML injection Inject 9 Units into the skin at bedtime.     [provider]  insulin regular (NOVOLIN R,HUMULIN  R) 100 units/mL injection Inject 5-10 Units into the skin 3 (three) times daily before meals.     [provider]  Magnesium 250 MG TABS Take by mouth.    [provider]  metFORMIN (GLUCOPHAGE) 1000 MG tablet Take 1,000 mg by mouth 2 (two) times daily with a meal.    [provider]  Multiple Vitamin (MULTIVITAMIN) capsule Take 1 capsule by mouth daily.    [provider]  oxyCODONE-acetaminophen (PERCOCET/ROXICET) 5-325 MG tablet Take 1 tablet by mouth every 6 (six) hours as needed for severe pain. 08/24/16   Altamese Dilling, MD  povidone-iodine (BETADINE) 10 % external solution To apply and clean the wound 3 times a week. 08/24/16   Altamese Dilling, MD   pravastatin (PRAVACHOL) 40 MG tablet Take 40 mg by mouth daily.    [provider]  propranolol (INDERAL) 40 MG tablet Take 1 tablet (40 mg total) by mouth 3 (three) times daily. 08/24/16   Altamese Dilling, MD  ramipril (ALTACE) 10 MG capsule Take 10 mg by mouth daily.    [provider]   No results found.  Positive ROS: All other systems have been reviewed and were otherwise negative with the exception of those mentioned in the HPI and as above.  12 point ROS was performed.  Physical Exam: General: Alert and oriented.  No apparent distress.  Vascular:  Left foot:Dorsalis Pedis:  absent Posterior Tibial:  absent  Right foot: Dorsalis Pedis:  absent Posterior Tibial:  absent  Neuro: Absent protective gross sensation  Derm: Left foot without ulceration.  Right foot with ulceration at the distal aspect of the fourth metatarsal head that probes deep down to the level of bone.  There is noted erythema diffusely to the fourth toe as well.  There is erythema to the lateral aspect of the right foot.  This is somewhat improved from his medical appearance in the outpatient clinic on Friday.  Ortho/MS: Status post right fifth ray amputation.  Edema to the forefoot.  Somewhat brawny edema to the foot.      X-rays from outpatient clinic shows erosion of the head of the fourth metatarsal and base of the proximal phalanx of the fourth toe with pathological fracture.  Calcified blood vessels were noted through the forefoot area.  MRI final results are pending.  Appears to be osteomyelitis of the fourth metatarsal and proximal phalanx.  Area of concern just lateral third metatarsal as well.  Will await final reading from specialist.  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   Recommend new current IV antibiotics.  I have asked vascular surgery for consultation as patient will likely need evaluation revascularization of the right lower extremity for planned surgical debridement.  At minimum patient will need a fourth ray amputation.  Concern of the infection to the third metatarsal may lead to transmetatarsal amputation.  Possible surgical debridement later on this week.    Irean Hong, DPM Cell [phone]   03/30/2020 8:33 AM

## 2020-03-30 NOTE — Progress Notes (Signed)
OT Cancellation Note  Patient Details Name: ASER NYLUND MRN: [id number] DOB: 1945-09-13   Cancelled Treatment:    Reason Eval/Treat Not Completed: OT screened, no needs identified, will sign off. Order received, chart reviewed. Pt appears to be at baseline functional independence. No skilled OT needs identified. Will sign off. Please re-consult if additional needs arise.   Matthew Folks, OTR/L [facility] [phone]

## 2020-03-31 ENCOUNTER — Other Ambulatory Visit (INDEPENDENT_AMBULATORY_CARE_PROVIDER_SITE_OTHER): Payer: Self-pay | Admitting: Vascular Surgery

## 2020-03-31 ENCOUNTER — Encounter
Admission: EM | Disposition: A | Discharge status: Home Health | Payer: Self-pay | Source: Home / Self Care | Attending: Internal Medicine

## 2020-03-31 DIAGNOSIS — I70234 Atherosclerosis of native arteries of right leg with ulceration of heel and midfoot: Secondary | ICD-10-CM

## 2020-03-31 DIAGNOSIS — E1169 Type 2 diabetes mellitus with other specified complication: Secondary | ICD-10-CM | POA: Diagnosis not present

## 2020-03-31 DIAGNOSIS — E11621 Type 2 diabetes mellitus with foot ulcer: Secondary | ICD-10-CM | POA: Diagnosis not present

## 2020-03-31 DIAGNOSIS — M86171 Other acute osteomyelitis, right ankle and foot: Secondary | ICD-10-CM | POA: Diagnosis not present

## 2020-03-31 DIAGNOSIS — I739 Peripheral vascular disease, unspecified: Secondary | ICD-10-CM | POA: Diagnosis not present

## 2020-03-31 HISTORY — PX: LOWER EXTREMITY ANGIOGRAPHY: CATH118251

## 2020-03-31 LAB — GLUCOSE, CAPILLARY
Glucose-Capillary: 128 mg/dL — ABNORMAL HIGH (ref 70–99)
Glucose-Capillary: 131 mg/dL — ABNORMAL HIGH (ref 70–99)
Glucose-Capillary: 153 mg/dL — ABNORMAL HIGH (ref 70–99)
Glucose-Capillary: 159 mg/dL — ABNORMAL HIGH (ref 70–99)
Glucose-Capillary: 169 mg/dL — ABNORMAL HIGH (ref 70–99)
Glucose-Capillary: 80 mg/dL (ref 70–99)
Glucose-Capillary: 94 mg/dL (ref 70–99)

## 2020-03-31 LAB — CREATININE, SERUM
Creatinine, Ser: 0.87 mg/dL (ref 0.61–1.24)
GFR, Estimated: >60 mL/min (ref >60)

## 2020-03-31 LAB — MAGNESIUM: Magnesium: 1.8 mg/dL (ref 1.7–2.4)

## 2020-03-31 SURGERY — LOWER EXTREMITY ANGIOGRAPHY
Anesthesia: Moderate Sedation | Laterality: Right

## 2020-03-31 MED ORDER — SODIUM CHLORIDE 0.9 % IV SOLN: INTRAVENOUS | Status: DC

## 2020-03-31 MED ORDER — DIPHENHYDRAMINE HCL 50 MG/ML IJ SOLN: 50.0000 mg | Freq: Once | INTRAMUSCULAR | Status: DC | PRN

## 2020-03-31 MED ORDER — HYDRALAZINE HCL 20 MG/ML IJ SOLN
INTRAMUSCULAR | Status: AC
  Filled 2020-03-31: qty 1

## 2020-03-31 MED ORDER — ASCORBIC ACID 500 MG PO TABS
250.0000 mg | ORAL_TABLET | Freq: Two times a day (BID) | ORAL | Status: DC
  Administered 2020-03-31 – 2020-04-05 (×10): 250 mg via ORAL
  Filled 2020-03-31 (×10): qty 1

## 2020-03-31 MED ORDER — ONDANSETRON HCL 4 MG/2ML IJ SOLN: 4.0000 mg | Freq: Four times a day (QID) | INTRAMUSCULAR | Status: DC | PRN

## 2020-03-31 MED ORDER — MIDAZOLAM HCL 2 MG/2ML IJ SOLN
INTRAMUSCULAR | Status: DC | PRN
  Administered 2020-03-31 (×2): 1 mg via INTRAVENOUS

## 2020-03-31 MED ORDER — HYDRALAZINE HCL 20 MG/ML IJ SOLN
20.0000 mg | Freq: Once | INTRAMUSCULAR | Status: AC
  Administered 2020-03-31: 20 mg via INTRAVENOUS

## 2020-03-31 MED ORDER — AMLODIPINE BESYLATE 5 MG PO TABS
5.0000 mg | ORAL_TABLET | Freq: Every day | ORAL | Status: DC
Start: 2020-03-31 — End: 2020-04-05
  Administered 2020-03-31 – 2020-04-05 (×6): 5 mg via ORAL
  Filled 2020-03-31 (×6): qty 1

## 2020-03-31 MED ORDER — ENSURE MAX PROTEIN PO LIQD
11.0000 [oz_av] | Freq: Two times a day (BID) | ORAL | Status: DC
  Filled 2020-03-31: qty 330

## 2020-03-31 MED ORDER — MIDAZOLAM HCL 5 MG/5ML IJ SOLN
INTRAMUSCULAR | Status: AC
  Filled 2020-03-31: qty 5

## 2020-03-31 MED ORDER — METHYLPREDNISOLONE SODIUM SUCC 125 MG IJ SOLR: 125.0000 mg | Freq: Once | INTRAMUSCULAR | Status: DC | PRN

## 2020-03-31 MED ORDER — FENTANYL CITRATE (PF) 100 MCG/2ML IJ SOLN
INTRAMUSCULAR | Status: DC | PRN
  Administered 2020-03-31 (×2): 25 ug via INTRAVENOUS

## 2020-03-31 MED ORDER — HEPARIN SODIUM (PORCINE) 1000 UNIT/ML IJ SOLN
INTRAMUSCULAR | Status: DC | PRN
  Administered 2020-03-31: 5000 [IU] via INTRAVENOUS

## 2020-03-31 MED ORDER — MIDAZOLAM HCL 2 MG/ML PO SYRP
8.0000 mg | ORAL_SOLUTION | Freq: Once | ORAL | Status: DC | PRN
  Filled 2020-03-31: qty 4

## 2020-03-31 MED ORDER — OCUVITE-LUTEIN PO CAPS
1.0000 | ORAL_CAPSULE | Freq: Every day | ORAL | Status: DC
  Administered 2020-04-01 – 2020-04-05 (×5): 1 via ORAL
  Filled 2020-03-31 (×5): qty 1

## 2020-03-31 MED ORDER — FAMOTIDINE 20 MG PO TABS: 40.0000 mg | ORAL_TABLET | Freq: Once | ORAL | Status: DC | PRN

## 2020-03-31 MED ORDER — HEPARIN SODIUM (PORCINE) 1000 UNIT/ML IJ SOLN
INTRAMUSCULAR | Status: AC
  Filled 2020-03-31: qty 1

## 2020-03-31 MED ORDER — IODIXANOL 320 MG/ML IV SOLN
INTRAVENOUS | Status: DC | PRN
  Administered 2020-03-31: 60 mL

## 2020-03-31 MED ORDER — FENTANYL CITRATE (PF) 100 MCG/2ML IJ SOLN
INTRAMUSCULAR | Status: AC
  Filled 2020-03-31: qty 2

## 2020-03-31 MED ORDER — HYDROMORPHONE HCL 1 MG/ML IJ SOLN: 1.0000 mg | Freq: Once | INTRAMUSCULAR | Status: DC | PRN

## 2020-03-31 MED ORDER — SODIUM CHLORIDE 0.9 % IV SOLN
INTRAVENOUS | Status: DC | PRN
  Administered 2020-03-31: 250 mL via INTRAVENOUS
  Administered 2020-04-03: 1000 mL via INTRAVENOUS

## 2020-03-31 SURGICAL SUPPLY — 18 items
BALLN LUTONIX 018 4X80X130 (BALLOONS) ×2
BALLN ULTRVRSE 2.5X300X150 (BALLOONS) ×2
BALLN ULTRVRSE 3X150X150 (BALLOONS) ×2
BALLOON LUTONIX 018 4X80X130 (BALLOONS) IMPLANT
BALLOON ULTRVRSE 2.5X300X150 (BALLOONS) IMPLANT
BALLOON ULTRVRSE 3X150X150 (BALLOONS) IMPLANT
CATH ANGIO 5F PIGTAIL 65CM (CATHETERS) ×1 IMPLANT
CATH BEACON 5 .038 100 VERT TP (CATHETERS) ×1 IMPLANT
CATH NAVICROSS ANGLED 135CM (MICROCATHETER) ×1 IMPLANT
DEVICE STARCLOSE SE CLOSURE (Vascular Products) ×1 IMPLANT
GLIDEWIRE ADV .035X260CM (WIRE) ×1 IMPLANT
KIT ENCORE 26 ADVANTAGE (KITS) ×1 IMPLANT
PACK ANGIOGRAPHY (CUSTOM PROCEDURE TRAY) ×2 IMPLANT
SHEATH BRITE TIP 5FRX11 (SHEATH) ×1 IMPLANT
SHEATH RAABE 6FRX70 (SHEATH) ×1 IMPLANT
TUBING CONTRAST HIGH PRESS 72 (TUBING) ×1 IMPLANT
WIRE G V18X300CM (WIRE) ×1 IMPLANT
WIRE GUIDERIGHT .035X150 (WIRE) ×2 IMPLANT

## 2020-03-31 NOTE — Interval H&P Note (Signed)
History and Physical Interval Note:  03/31/2020 3:22 PM  Jason [name]  has presented today for surgery, with the diagnosis of PAD.  The various methods of treatment have been discussed with the patient and family. After consideration of risks, benefits and other options for treatment, the patient has consented to  Procedure(s): Lower Extremity Angiography (Right) as a surgical intervention.  The patient's history has been reviewed, patient examined, no change in status, stable for surgery.  I have reviewed the patient's chart and labs.  Questions were answered to the patient's satisfaction.     Festus Barren

## 2020-03-31 NOTE — Progress Notes (Signed)
ORTHOPAEDIC progress note  REQUESTING PHYSICIAN: Alford Highland, MD  Chief Complaint: Right foot ulcer  HPI: Jason [name] is a 75 y.o. male who presents resting in bed comfortably.  Patient has dressing clean, dry, and intact right foot with surgical shoe on.  Patient is currently n.p.o. for vascular procedure later today to assess patient's blood flow and to try to improve it for potential surgical intervention on the right foot that will be needed to resolve infection.  Patient currently denies nausea, vomiting, fever, chills.  Past Medical History:  Diagnosis Date  . CAD (coronary artery disease)   . Colon polyp   . Diabetes mellitus without complication (HCC)   . Hyperlipemia   . Hypertension   . Orchitis   . PVD (peripheral vascular disease) (HCC)   . Tremor    Past Surgical History:  Procedure Laterality Date  . AMPUTATION Right 08/21/2016   Procedure: RAY RESECTION-RIGHT 5TH RAY;  Surgeon: Linus Galas, DPM;  Location: [facility] ORS;  Service: Podiatry;  Laterality: Right;  . AMPUTATION TOE Left    big  . CAROTID ENDARTERECTOMY Right   . COLON SURGERY    . CORONARY ARTERY BYPASS GRAFT    . ROTATOR CUFF REPAIR Right    Social History   Socioeconomic History  . Marital status: Married    Spouse name: Not on file  . Number of children: Not on file  . Years of education: Not on file  . Highest education level: Not on file  Occupational History  . Occupation: IT sales professional  Tobacco Use  . Smoking status: Never Smoker  . Smokeless tobacco: Never Used  Substance and Sexual Activity  . Alcohol use: No  . Drug use: No  . Sexual activity: Not on file  Other Topics Concern  . Not on file  Social History Narrative  . Not on file   Social Determinants of Health   Financial Resource Strain: Not on file  Food Insecurity: Not on file  Transportation Needs: Not on file  Physical Activity: Not on file  Stress: Not on file  Social Connections: Not on file   Family  History  Problem Relation Age of Onset  . Diabetes Mother   . Diabetes Father    No Known Allergies Prior to Admission medications   Medication Sig Start Date End Date Taking? Authorizing Provider  aspirin 81 MG tablet Take 81 mg by mouth daily.    [provider]  clopidogrel (PLAVIX) 75 MG tablet Take 75 mg by mouth daily.    [provider]  docusate sodium (COLACE) 100 MG capsule Take 1 capsule (100 mg total) by mouth 2 (two) times daily as needed for mild constipation. 08/24/16   Altamese Dilling, MD  glimepiride (AMARYL) 4 MG tablet Take 4 mg by mouth 2 (two) times daily.     [provider]  insulin glargine (LANTUS) 100 UNIT/ML injection Inject 9 Units into the skin at bedtime.     [provider]  insulin regular (NOVOLIN R,HUMULIN R) 100 units/mL injection Inject 5-10 Units into the skin 3 (three) times daily before meals.     [provider]  Magnesium 250 MG TABS Take by mouth.    [provider]  metFORMIN (GLUCOPHAGE) 1000 MG tablet Take 1,000 mg by mouth 2 (two) times daily with a meal.    [provider]  Multiple Vitamin (MULTIVITAMIN) capsule Take 1 capsule by mouth daily.    [provider]  oxyCODONE-acetaminophen (PERCOCET/ROXICET) 5-325  MG tablet Take 1 tablet by mouth every 6 (six) hours as needed for severe pain. 08/24/16   Altamese Dilling, MD  povidone-iodine (BETADINE) 10 % external solution To apply and clean the wound 3 times a week. 08/24/16   Altamese Dilling, MD  pravastatin (PRAVACHOL) 40 MG tablet Take 40 mg by mouth daily.    [provider]  propranolol (INDERAL) 40 MG tablet Take 1 tablet (40 mg total) by mouth 3 (three) times daily. 08/24/16   Altamese Dilling, MD  ramipril (ALTACE) 10 MG capsule Take 10 mg by mouth daily.    [provider]   MR FOOT RIGHT WO CONTRAST  Result Date: 03/30/2020 CLINICAL DATA:  Chronic right foot wound.  Pain.   Diabetic patient. EXAM: MRI OF THE RIGHT FOREFOOT WITHOUT CONTRAST TECHNIQUE: Multiplanar, multisequence MR imaging of the right forefoot was performed. No intravenous contrast was administered. COMPARISON:  Plain films right foot 03/16/2015. FINDINGS: Bones/Joint/Cartilage The patient has undergone amputation at the level of the proximal and middle thirds of the diaphysis of the fifth metatarsal since the prior exam. There is marrow edema throughout the fourth metatarsal, fourth toe, third metatarsal and majority of the proximal phalanx of the third toe. The middle phalanx of the third toe is spared but there is edema in the distal phalanx of the third toe. Edema is also seen in the cuboid and in the proximal 2.4 cm of the fifth metatarsal remnant. There is osseous fusion across the cuboid and fourth and fifth metatarsals. Bone marrow signal is otherwise normal. Midfoot neuropathic change noted Ligaments Grossly intact. Muscles and Tendons Severe fatty atrophy of intrinsic musculature the foot. Soft tissues Skin wound is seen along the lateral aspect of the patient's stump. Scattered locules of soft tissue gas predominant about the lateral aspect of the foot are noted. There may also be a skin wound on the plantar surface of the foot deep to the distal fourth metatarsal. No abscess is identified. IMPRESSION: 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 Negative for abscess or osteomyelitis with skin wounds and small foci of soft tissue gas in the lateral aspect of the foot noted. Electronically Signed   By: Drusilla Kanner M.D.   On: 03/30/2020 09:17   US ARTERIAL LOWER EXTREMITY DUPLEX RIGHT(NON-ABI)  Result Date: 03/30/2020 CLINICAL DATA:  Diabetic foot ulcer. Previous toe amputations. Hypertension, hyperlipidemia, coronary  artery disease, diabetes EXAM: RIGHT LOWER EXTREMITY ARTERIAL DUPLEX SCAN TECHNIQUE: Gray-scale sonography as well as color Doppler and duplex ultrasound was performed to evaluate the lower extremity arteries including the common, superficial and profunda femoral arteries, popliteal artery and calf arteries. COMPARISON:  None. FINDINGS: Right lower Extremity ABI: Not calculated Inflow: Normal common femoral arterial waveforms and velocities. No evidence of inflow (aortoiliac) disease. Scattered nonocclusive plaque. Outflow: Normal profunda femoral, and proximal superficial femoral arterial waveforms and velocities. Monophasic waveforms in the distal SFA and popliteal artery. No focal elevation of the PSV to suggest stenosis. Scattered nonocclusive plaque. Runoff: Occlusion in the proximal/mid posterior tibial artery, reconstituted distally with low velocity monophasic waveform. Patent anterior tibial artery with monophasic waveform. IMPRESSION: Occlusion in the right posterior tibial artery, reconstituted distally. Correlate with ABIs. Electronically Signed   By: Corlis Leak M.D.   On: 03/30/2020 08:31    Positive ROS: All other systems have been reviewed and  were otherwise negative with the exception of those mentioned in the HPI and as above.  12 point ROS was performed.  Physical Exam: General: Alert and oriented.  No apparent distress.  Vascular:  Left foot:Dorsalis Pedis:  absent Posterior Tibial:  absent  Right foot: Dorsalis Pedis:  absent Posterior Tibial:  absent  Neuro: Absent protective gross sensation  Derm: Left foot without ulceration.  Right foot with ulceration at the distal aspect of the fourth metatarsal head that probes deep down to the level of bone.  There is noted erythema diffusely to the fourth toe as well.  There is erythema to the lateral aspect of the right foot.  Erythema does appear to be improving though since admission.  Ortho/MS: Status post right fifth ray  amputation.  Edema to the forefoot.  Somewhat brawny edema to the foot.      X-rays from outpatient clinic shows erosion of the head of the fourth metatarsal and base of the proximal phalanx of the fourth toe with pathological fracture.  Calcified blood vessels were noted through the forefoot area.  MRI final results are pending.  Appears to be osteomyelitis of the fourth metatarsal and proximal phalanx.  Area of concern just lateral third metatarsal as well.  Also some reactive marrow edema also present to the cuboid and fifth metatarsal base which is likely related to Charcot neuroarthropathy will await final reading from specialist.  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 -Wound appears to be stable at this time with no worsening changes, overall improvement in erythema seen since admission. -Continue with daily dressing changes. -Patient may be partial weightbearing with heel contact and surgical shoe. -Appreciate vascular recommendations. -Patient will likely need right transmetatarsal amputation with tendo Achilles lengthening after revascularization procedure.  Patient will likely also need bone biopsy of cuboid and fifth metatarsal base.  Believe that the changes that are seen on MRI in these areas are likely due to reactive edema from Charcot neuroarthropathy. -Wound culture still pending.  Appreciate IV antibiotic therapy still per medicine. -We will reevaluate patient again tomorrow and discuss potential surgical plan for Wednesday.  Rosetta Posner, DPM  03/31/2020 1:13 PM

## 2020-03-31 NOTE — Progress Notes (Signed)
Patient ID: Jason [name], male   DOB: Oct 14, 1945, 75 y.o.   MRN: [id number] [facility] PROGRESS NOTE  TREQUAN MARSOLEK ZCH:[id number] DOB: 08/27/1945 DOA: 03/29/2020 PCP: Marina Goodell, MD  HPI/Subjective: Patient feeling okay.  Offers no complaints.  Asking a lot of questions about what the plan is.  Has a little pain in his foot.  Patient is admitted with a draining ulcer on the right side of his foot and found to have osteomyelitis  Objective: Vitals:   03/31/20 0752 03/31/20 1143  BP: (!) 171/70 (!) 165/76  Pulse: (!) 54 (!) 55  Resp: 18 18  Temp: 97.7 F (36.5 C) 98 F (36.7 C)  SpO2: 98% 98%    Intake/Output Summary (Last 24 hours) at 03/31/2020 1535 Last data filed at 03/31/2020 0428 Gross per 24 hour  Intake 648.89 ml  Output 650 ml  Net -1.11 ml   Filed Weights   03/29/20 1514 03/29/20 2315  Weight: 99.8 kg 96.4 kg    ROS: Review of Systems  Respiratory: Negative for shortness of breath.   Cardiovascular: Negative for chest pain.  Gastrointestinal: Negative for abdominal pain, nausea and vomiting.  Musculoskeletal: Positive for joint pain.   Exam: Physical Exam HENT:     Head: Normocephalic.     Mouth/Throat:     Pharynx: No oropharyngeal exudate.  Eyes:     General: Lids are normal.     Conjunctiva/sclera: Conjunctivae normal.  Cardiovascular:     Rate and Rhythm: Normal rate and regular rhythm.     Heart sounds: Normal heart sounds, S1 normal and S2 normal.  Pulmonary:     Breath sounds: Normal breath sounds. No decreased breath sounds, wheezing, rhonchi or rales.  Abdominal:     Palpations: Abdomen is soft.     Tenderness: There is no abdominal tenderness.  Musculoskeletal:     Right lower leg: Swelling present.     Left lower leg: No swelling.  Skin:    General: Skin is warm.     Findings: No rash.  Neurological:     Mental Status: He is alert and oriented to person, place, and time.       Data Reviewed: Basic Metabolic  Panel: Recent Labs  Lab 03/29/20 1616 03/30/20 0515 03/31/20 0724  NA 137 137  --   K 3.7 3.7  --   CL 103 107  --   CO2 26 23  --   GLUCOSE 104* 197*  --   BUN 22 17  --   CREATININE 0.87 0.73 0.87  CALCIUM 9.7 9.0  --   MG 1.5* 1.5* 1.8  PHOS  --  2.9  --    Liver Function Tests: Recent Labs  Lab 03/29/20 1616 03/30/20 0515  AST 22 19  ALT 22 19  ALKPHOS 79 75  BILITOT 0.7 0.7  PROT 7.6 6.8  ALBUMIN 3.3* 2.9*   CBC: Recent Labs  Lab 03/29/20 1616 03/30/20 0515  WBC 6.2 4.9  NEUTROABS 4.0 3.0  HGB 11.8* 11.6*  HCT 35.3* 32.7*  MCV 86.7 84.7  PLT 286 259    CBG: Recent Labs  Lab 03/30/20 1623 03/30/20 2044 03/31/20 0429 03/31/20 0750 03/31/20 1141  GLUCAP 181* 200* 131* 153* 128*    Recent Results (from the past 240 hour(s))  SARS CORONAVIRUS 2 (TAT 6-24 HRS) Nasopharyngeal Nasopharyngeal Swab     Status: None   Collection Time: 03/29/20  7:41 PM   Specimen: Nasopharyngeal Swab  Result Value Ref Range  Status   SARS Coronavirus 2 NEGATIVE NEGATIVE Final    Comment: (NOTE) SARS-CoV-2 target nucleic acids are NOT DETECTED.  The SARS-CoV-2 RNA is generally detectable in upper and lower respiratory specimens during the acute phase of infection. Negative results do not preclude SARS-CoV-2 infection, do not rule out co-infections with other pathogens, and should not be used as the sole basis for treatment or other patient management decisions. Negative results must be combined with clinical observations, patient history, and epidemiological information. The expected result is Negative.  Fact Sheet for Patients: HairSlick.no  Fact Sheet for Healthcare Providers: quierodirigir.com  This test is not yet approved or cleared by the [place] FDA and  has been authorized for detection and/or diagnosis of SARS-CoV-2 by FDA under an Emergency Use Authorization (EUA). This EUA will remain  in  effect (meaning this test can be used) for the duration of the COVID-19 declaration under Se ction 564(b)(1) of the Act, 21 U.S.C. section 360bbb-3(b)(1), unless the authorization is terminated or revoked sooner.  Performed at [facility], [address] [place], [place] [address]   Blood culture (routine x 2)     Status: None (Preliminary result)   Collection Time: 03/29/20  7:41 PM   Specimen: BLOOD  Result Value Ref Range Status   Specimen Description BLOOD LEFT ANTECUBITAL  Final   Special Requests   Final    BOTTLES DRAWN AEROBIC AND ANAEROBIC Blood Culture results may not be optimal due to an inadequate volume of blood received in culture bottles   Culture   Final    NO GROWTH 2 DAYS Performed at [facility], [address], [place], [place] [address]    Report Status PENDING  Incomplete  Blood culture (routine x 2)     Status: None (Preliminary result)   Collection Time: 03/29/20  7:41 PM   Specimen: BLOOD  Result Value Ref Range Status   Specimen Description BLOOD BLOOD RIGHT FOREARM  Final   Special Requests   Final    BOTTLES DRAWN AEROBIC AND ANAEROBIC Blood Culture adequate volume   Culture   Final    NO GROWTH 2 DAYS Performed at [facility], [address], [place], [place] [address]    Report Status PENDING  Incomplete     Studies: MR FOOT RIGHT WO CONTRAST  Result Date: 03/30/2020 CLINICAL DATA:  Chronic right foot wound.  Pain.  Diabetic patient. EXAM: MRI OF THE RIGHT FOREFOOT WITHOUT CONTRAST TECHNIQUE: Multiplanar, multisequence MR imaging of the right forefoot was performed. No intravenous contrast was administered. COMPARISON:  Plain films right foot 03/16/2015. FINDINGS: Bones/Joint/Cartilage The patient has undergone amputation at the level of the proximal and middle thirds of the diaphysis of the fifth metatarsal since the prior exam. There is marrow edema throughout the fourth metatarsal, fourth toe, third metatarsal and  majority of the proximal phalanx of the third toe. The middle phalanx of the third toe is spared but there is edema in the distal phalanx of the third toe. Edema is also seen in the cuboid and in the proximal 2.4 cm of the fifth metatarsal remnant. There is osseous fusion across the cuboid and fourth and fifth metatarsals. Bone marrow signal is otherwise normal. Midfoot neuropathic change noted Ligaments Grossly intact. Muscles and Tendons Severe fatty atrophy of intrinsic musculature the foot. Soft tissues Skin wound is seen along the lateral aspect of the patient's stump. Scattered locules of soft tissue gas predominant about the lateral aspect of the  foot are noted. There may also be a skin wound on the plantar surface of the foot deep to the distal fourth metatarsal. No abscess is identified. IMPRESSION: 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 Negative for abscess or osteomyelitis with skin wounds and small foci of soft tissue gas in the lateral aspect of the foot noted. Electronically Signed   By: Drusilla Kanner M.D.   On: 03/30/2020 09:17   US ARTERIAL LOWER EXTREMITY DUPLEX RIGHT(NON-ABI)  Result Date: 03/30/2020 CLINICAL DATA:  Diabetic foot ulcer. Previous toe amputations. Hypertension, hyperlipidemia, coronary artery disease, diabetes EXAM: RIGHT LOWER EXTREMITY ARTERIAL DUPLEX SCAN TECHNIQUE: Gray-scale sonography as well as color Doppler and duplex ultrasound was performed to evaluate the lower extremity arteries including the common, superficial and profunda femoral arteries, popliteal artery and calf arteries. COMPARISON:  None. FINDINGS: Right lower Extremity ABI: Not calculated Inflow: Normal common femoral arterial waveforms and velocities. No evidence of inflow (aortoiliac) disease. Scattered  nonocclusive plaque. Outflow: Normal profunda femoral, and proximal superficial femoral arterial waveforms and velocities. Monophasic waveforms in the distal SFA and popliteal artery. No focal elevation of the PSV to suggest stenosis. Scattered nonocclusive plaque. Runoff: Occlusion in the proximal/mid posterior tibial artery, reconstituted distally with low velocity monophasic waveform. Patent anterior tibial artery with monophasic waveform. IMPRESSION: Occlusion in the right posterior tibial artery, reconstituted distally. Correlate with ABIs. Electronically Signed   By: Corlis Leak M.D.   On: 03/30/2020 08:31    Scheduled Meds: . [MAR Hold] amLODipine  5 mg Oral Daily  . [MAR Hold] vitamin C  250 mg Oral BID  . [MAR Hold] insulin aspart  0-9 Units Subcutaneous Q4H  . [MAR Hold] insulin glargine  5 Units Subcutaneous QHS  . [MAR Hold] multivitamin-lutein  1 capsule Oral Daily  . [MAR Hold] pravastatin  40 mg Oral Daily  . [MAR Hold] propranolol  40 mg Oral TID  . [MAR Hold] Ensure Max Protein  11 oz Oral BID  . [MAR Hold] ramipril  10 mg Oral Daily   Continuous Infusions: . sodium chloride    . [MAR Hold] ceFEPime (MAXIPIME) IV 2 g (03/31/20 1310)  . [MAR Hold] vancomycin 1,250 mg (03/31/20 1121)    Assessment/Plan:  1. 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  Patient has a diabetic foot ulcer with muscle exposure.  Patient on vancomycin and cefepime.  Patient down for angiogram now.  Will end up needing amputation as per podiatry. 2. Type 2 diabetes mellitus with hyperlipidemia.  Continue pravastatin.  On low-dose Lantus insulin and holding oral medications glimepiride and Metformin for now. 3. Peripheral vascular disease.  Holding aspirin and Plavix for amputation.  Having angiogram today. 4. Essential hypertension on Altace and propranolol.  Added Norvasc this morning for elevated  blood pressure.        Code Status:     Code Status Orders  (From admission, onward)         Start     Ordered   03/29/20 2341  Full code  Continuous        03/29/20 2340        Code Status History    Date Active Date Inactive Code Status Order ID Comments User Context   08/20/2016 1324  08/24/2016 2106 Full Code [id number]  Altamese DillingVachhani, Vaibhavkumar, MD Inpatient   Advance Care Planning Activity     Family Communication: Spoke with daughter Sue Lushndrea on the phone Disposition Plan: Status is: Inpatient  Dispo: The patient is from: Home              Anticipated d/c is to: Home              Patient currently requiring IV antibiotics for osteomyelitis.  For angiogram today and will need amputation prior to disposition.   Difficult to place patient.  No.  Consultants:  Podiatry  Vascular surgery  Antibiotics:  Vancomycin  Cefepime  Time spent: 27 minutes  [employer]  [facility]

## 2020-03-31 NOTE — Progress Notes (Signed)
Initial Nutrition Assessment  DOCUMENTATION CODES:   Obesity unspecified  INTERVENTION:   Ensure Max protein supplement BID, each supplement provides 150kcal and 30g of protein.  Ocuvite daily for wound healing (provides zinc, vitamin A, vitamin C, Vitamin E, copper, and selenium)  Vitamin C 250mg  po BID  NUTRITION DIAGNOSIS:   Increased nutrient needs related to wound healing as evidenced by estimated needs.  GOAL:   Patient will meet greater than or equal to 90% of their needs  MONITOR:   PO intake,Supplement acceptance,Labs,Weight trends,Skin,I & O's  REASON FOR ASSESSMENT:   Consult Assessment of nutrition requirement/status  ASSESSMENT:   75 y/o male with h/o DM, CAD, HTN, and HLD who is admitted with osteomyelitis secondary to diabetic foot wound   RD working remotely.  Pt with good appetite and oral intake in hospital; pt eating 90% of meals. RD will add supplements and vitamins to help pt meet his estimated needs and to support wound healing. Per chart, pt's UBW appears to be ~220lbs; pt currently 8lbs(4%) from his UBW. RD will obtain history and nutrition related exam at follow-up. RD will also attempt to provide pt with diabetic diet education prior to discharge.    Medications reviewed and include: insulin, cefepime, vancomycin   Labs reviewed: K 3.7 wnl, P 2.9 wnl, Mg 1.8 wnl Hgb 11.6(L), Hct 32.7(L) cbgs- 131, 153, 128 x 24 hrs AIC 8.2(H)- 3/19  NUTRITION - FOCUSED PHYSICAL EXAM: Unable to perform at this time  Diet Order:   Diet Order            Diet NPO time specified Except for: Sips with Meds  Diet effective midnight                EDUCATION NEEDS:   Education needs have been addressed  Skin:  Skin Assessment: Reviewed RN Assessment (diabetic foot wound R foot)  Last BM:  3/21- type 6  Height:   Ht Readings from Last 1 Encounters:  03/29/20 5\' 10"  (1.778 m)    Weight:   Wt Readings from Last 1 Encounters:  03/29/20 96.4 kg     Ideal Body Weight:  75.4 kg  BMI:  Body mass index is 30.49 kg/m.  Estimated Nutritional Needs:   Kcal:  2100-2400kcal/day  Protein:  105-120g/day  Fluid:  1.9-2.2L/day  MS, RD, LDN Please refer to [facility] for RD and/or RD on-call/weekend/after hours pager

## 2020-03-31 NOTE — Clinical Social Work Note (Signed)
CSW acknowledges consult for HH/DME needs and medication assistance. No needs identified by PT or OT. Patient has [facility] Medicare so will be unable to get medications from [facility]. If coupons needed at discharge, please notify TOC.  Charlynn Court, CSW [phone]

## 2020-03-31 NOTE — Op Note (Addendum)
[facility]  Percutaneous Study/Intervention Procedural Note   Date of Surgery:  03/31/2020  Surgeon(s):Cincere Zorn    Assistants:none  Pre-operative Diagnosis: PAD with ulceration and infection RLE  Post-operative diagnosis:  Same  Procedure(s) Performed:             1.  Ultrasound guidance for vascular access left femoral artery             2.  Catheter placement into right common femoral artery from left femoral approach             3.  Aortogram and selective right lower extremity angiogram             4.  Percutaneous transluminal angioplasty of right anterior tibial artery with 2.21m x 30 cm length angioplasty balloon throughout and a 3 mm diameter by 15 cm length angioplasty balloon proximally             5.   Percutaneous transluminal angioplasty of right mid peroneal artery with 2.5 mm diameter angioplasty balloon  6.  Percutaneous transluminal angioplasty of the right popliteal artery and tibioperoneal trunk with 4 mm diameter by 8 cm length Lutonix drug-coated angioplasty balloon             7.  StarClose closure device left femoral artery  EBL: 5 cc  Contrast: 60 cc  Fluoro Time: 6.4 minutes  Moderate Conscious Sedation Time: approximately 48 minutes using 2 mg of Versed and 50 mcg of Fentanyl              Indications:  Patient is a 75y.o.male with nonhealing ulceration and infection of the right foot with nonpalpable pedal pulses. The patient is brought in for angiography for further evaluation and potential treatment.  Due to the limb threatening nature of the situation, angiogram was performed for attempted limb salvage. The patient is aware that if the procedure fails, amputation would be expected.  The patient also understands that even with successful revascularization, amputation may still be required due to the severity of the situation.  Risks and benefits are discussed and informed consent is obtained.   Procedure:  The patient was  identified and appropriate procedural time out was performed.  The patient was then placed supine on the table and prepped and draped in the usual sterile fashion. Moderate conscious sedation was administered during a face to face encounter with the patient throughout the procedure with my supervision of the RN administering medicines and monitoring the patient's vital signs, pulse oximetry, telemetry and mental status throughout from the start of the procedure until the patient was taken to the recovery room. Ultrasound was used to evaluate the left common femoral artery.  It was patent .  A digital ultrasound image was acquired.  A Seldinger needle was used to access the left common femoral artery under direct ultrasound guidance and a permanent image was performed.  A 0.035 J wire was advanced without resistance and a 5Fr sheath was placed.  Pigtail catheter was placed into the aorta and an AP aortogram was performed. This demonstrated that the aorta and iliac arteries showed only mild disease in the iliac arteries.  There did appear to be a hemodynamically significant stenosis in the right renal artery.  The left renal artery had mild to moderate stenosis. I then crossed the aortic bifurcation and advanced to the right femoral head. Selective right lower extremity angiogram was then performed. This demonstrated mildly diseased SFA and common femoral artery.  The distal popliteal artery and tibioperoneal trunk had about an 80 to 90% stenosis.  There is also about a 60 to 70% stenosis in the mid segment of the peroneal artery which was the best runoff distally.  The anterior tibial artery had multiple areas of high-grade greater than 80% stenosis and potentially short segment occlusions throughout the proximal and mid segments although it was better in the distal segment although small.  The posterior tibial artery was chronically occluded. It was felt that it was in the patient's best interest to proceed with  intervention after these images to avoid a second procedure and a larger amount of contrast and fluoroscopy based off of the findings from the initial angiogram. The patient was systemically heparinized and a 6 French 70 cm sheath was then placed over the Terumo Advantage wire. I then used a Kumpe catheter and the advantage wire to navigate first into the anterior tibial artery and then exchanged for a Nava cross catheter and the V 18 wire.  I was unable to parked the wire down to the foot.  A 2.5 mm diameter by 30 cm length angioplasty balloon was inflated to 10 atm for 1 minute from the distal anterior tibial artery up to the proximal portion.  There is still disease in the proximal portion was treated with a 3 mm diameter by 15 cm length angioplasty balloon inflated to 8 atm for 1 minute.  Completion imaging showed about a 30 to 40% residual stenosis in the proximal segment but the flow was markedly improved.  I then turned my attention to the perineal lesion.  Using a Ferd Hibbs cross catheter and the V 18 wire was able navigate through the popliteal and tibioperoneal trunk stenosis and down into the peroneal artery and cross the stenosis in the midsegment.  A 2.5 mm diameter angioplasty balloon was then inflated to 12 atm for 1 minute with completion imaging showing only about a 15 to 20% residual stenosis.  I then turned my attention to the distal popliteal and tibioperoneal trunk lesion.  This was treated with a 4 mm diameter by 8 cm length Lutonix drug-coated angioplasty balloon inflated to 8 atm for 1 minute.  Completion imaging showed about a 15% residual stenosis. I elected to terminate the procedure. The sheath was removed and StarClose closure device was deployed in the left femoral artery with excellent hemostatic result. The patient was taken to the recovery room in stable condition having tolerated the procedure well.  Findings:               Aortogram:  Aorta and iliac arteries showed only mild disease  in the iliac arteries.  There did appear to be a hemodynamically significant stenosis in the right renal artery.  The left renal artery had mild to moderate stenosis.             Right lower Extremity:  This demonstrated mildly diseased SFA and common femoral artery.  The distal popliteal artery and tibioperoneal trunk had about an 80 to 90% stenosis.  There is also about a 60 to 70% stenosis in the mid segment of the peroneal artery which was the best runoff distally.  The anterior tibial artery had multiple areas of high-grade greater than 80% stenosis and potentially short segment occlusions throughout the proximal and mid segments although it was better in the distal segment although small.  The posterior tibial artery was chronically occluded.   Disposition: Patient was taken to the recovery room in stable  condition having tolerated the procedure well.  Complications: None  Leotis Pain 03/31/2020 5:14 PM   This note was created with Dragon Medical transcription system. Any errors in dictation are purely unintentional.

## 2020-03-31 NOTE — Plan of Care (Signed)

## 2020-04-01 ENCOUNTER — Encounter: Payer: Self-pay | Admitting: Vascular Surgery

## 2020-04-01 DIAGNOSIS — E11628 Type 2 diabetes mellitus with other skin complications: Secondary | ICD-10-CM | POA: Diagnosis not present

## 2020-04-01 DIAGNOSIS — E1169 Type 2 diabetes mellitus with other specified complication: Secondary | ICD-10-CM | POA: Diagnosis not present

## 2020-04-01 DIAGNOSIS — L089 Local infection of the skin and subcutaneous tissue, unspecified: Secondary | ICD-10-CM

## 2020-04-01 DIAGNOSIS — I10 Essential (primary) hypertension: Secondary | ICD-10-CM

## 2020-04-01 DIAGNOSIS — M19071 Primary osteoarthritis, right ankle and foot: Secondary | ICD-10-CM

## 2020-04-01 DIAGNOSIS — I739 Peripheral vascular disease, unspecified: Secondary | ICD-10-CM

## 2020-04-01 DIAGNOSIS — M86171 Other acute osteomyelitis, right ankle and foot: Secondary | ICD-10-CM | POA: Diagnosis not present

## 2020-04-01 DIAGNOSIS — L97511 Non-pressure chronic ulcer of other part of right foot limited to breakdown of skin: Secondary | ICD-10-CM

## 2020-04-01 DIAGNOSIS — I2510 Atherosclerotic heart disease of native coronary artery without angina pectoris: Secondary | ICD-10-CM

## 2020-04-01 DIAGNOSIS — E08621 Diabetes mellitus due to underlying condition with foot ulcer: Secondary | ICD-10-CM

## 2020-04-01 LAB — GLUCOSE, CAPILLARY
Glucose-Capillary: 186 mg/dL — ABNORMAL HIGH (ref 70–99)
Glucose-Capillary: 146 mg/dL — ABNORMAL HIGH (ref 70–99)
Glucose-Capillary: 204 mg/dL — ABNORMAL HIGH (ref 70–99)
Glucose-Capillary: 166 mg/dL — ABNORMAL HIGH (ref 70–99)
Glucose-Capillary: 198 mg/dL — ABNORMAL HIGH (ref 70–99)

## 2020-04-01 LAB — BASIC METABOLIC PANEL
Anion gap: 5 (ref 5–15)
BUN: 17 mg/dL (ref 8–23)
CO2: 23 mmol/L (ref 22–32)
Calcium: 8.8 mg/dL — ABNORMAL LOW (ref 8.9–10.3)
Chloride: 110 mmol/L (ref 98–111)
Creatinine, Ser: 0.82 mg/dL (ref 0.61–1.24)
GFR, Estimated: >60 mL/min (ref >60)
Glucose, Bld: 174 mg/dL — ABNORMAL HIGH (ref 70–99)
Potassium: 3.7 mmol/L (ref 3.5–5.1)
Sodium: 138 mmol/L (ref 135–145)

## 2020-04-01 MED ORDER — SODIUM CHLORIDE 0.9 % IV SOLN
3.0000 g | Freq: Four times a day (QID) | INTRAVENOUS | Status: DC
  Administered 2020-04-02 – 2020-04-05 (×13): 3 g via INTRAVENOUS
  Filled 2020-04-01 (×2): qty 8
  Filled 2020-04-01: qty 3
  Filled 2020-04-01: qty 8
  Filled 2020-04-01: qty 3
  Filled 2020-04-01 (×4): qty 8
  Filled 2020-04-01 (×2): qty 3
  Filled 2020-04-01 (×4): qty 8
  Filled 2020-04-01: qty 3

## 2020-04-01 NOTE — Progress Notes (Signed)
Patient refused his evening dose of propranolol, reporting that he does not take it in the evening at home. Informed patient of his elevated BP's earlier in the day. Patient asked that propranolol be held and he stated he will discuss with the MD.

## 2020-04-01 NOTE — Anesthesia Preprocedure Evaluation (Addendum)
Anesthesia Evaluation  Patient identified by MRN, date of birth, ID band Patient awake    Reviewed: Allergy & Precautions, NPO status , Patient's Chart, lab work & pertinent test results  History of Anesthesia Complications Negative for: history of anesthetic complications  Airway Mallampati: II  TM Distance: >3 FB Neck ROM: limited    Dental  (+) Dental Advidsory Given, Partial Lower, Missing, Edentulous Upper   Pulmonary neg pulmonary ROS,    Pulmonary exam normal        Cardiovascular hypertension, (-) angina+ CAD, + CABG and + Peripheral Vascular Disease  (-) Past MI and (-) Cardiac Stents Normal cardiovascular exam(-) dysrhythmias (-) Valvular Problems/Murmurs     Neuro/Psych neg Seizures  Neuromuscular disease CVA, No Residual Symptoms negative psych ROS   GI/Hepatic negative GI ROS, Neg liver ROS,   Endo/Other  diabetes  Renal/GU negative Renal ROS     Musculoskeletal  (+) Arthritis , Osteoarthritis,    Abdominal   Peds negative pediatric ROS (+)  Hematology negative hematology ROS (+)   Anesthesia Other Findings Past Medical History: No date: CAD (coronary artery disease) No date: Colon polyp No date: Diabetes mellitus without complication (HCC) No date: Hyperlipemia No date: Hypertension No date: Orchitis No date: PVD (peripheral vascular disease) (HCC) No date: Tremor   Reproductive/Obstetrics                                                            Anesthesia Evaluation  Patient identified by MRN, date of birth, ID band Patient awake    Reviewed: Allergy & Precautions, NPO status , Patient's Chart, lab work & pertinent test results  History of Anesthesia Complications Negative for: history of anesthetic complications  Airway Mallampati: III  TM Distance: >3 FB Neck ROM: Full    Dental  (+) Edentulous Upper, Missing   Pulmonary neg sleep apnea, neg COPD,     breath sounds clear to auscultation- rhonchi (-) wheezing      Cardiovascular hypertension, Pt. on medications + CAD and + Peripheral Vascular Disease  (-) Past MI, (-) Cardiac Stents and (-) CABG  Rhythm:Regular Rate:Normal - Systolic murmurs and - Diastolic murmurs    Neuro/Psych negative neurological ROS  negative psych ROS   GI/Hepatic negative GI ROS, Neg liver ROS,   Endo/Other  diabetes, Insulin Dependent, Oral Hypoglycemic Agents  Renal/GU negative Renal ROS     Musculoskeletal negative musculoskeletal ROS (+)   Abdominal (+) + obese,   Peds  Hematology negative hematology ROS (+)   Anesthesia Other Findings Past Medical History: No date: CAD (coronary artery disease) No date: Colon polyp No date: Diabetes mellitus without complication (HCC) No date: Hyperlipemia No date: Hypertension No date: Orchitis No date: PVD (peripheral vascular disease) (HCC) No date: Tremor   Reproductive/Obstetrics                             Anesthesia Physical Anesthesia Plan  ASA: III  Anesthesia Plan: General   Post-op Pain Management:    Induction: Intravenous  PONV Risk Score and Plan: 1 and Ondansetron and Propofol infusion  Airway Management Planned: Natural Airway  Additional Equipment:   Intra-op Plan:   Post-operative Plan:   Informed Consent: I have reviewed the patients History and  Physical, chart, labs and discussed the procedure including the risks, benefits and alternatives for the proposed anesthesia with the patient or authorized representative who has indicated his/her understanding and acceptance.   Dental advisory given  Plan Discussed with: CRNA and Anesthesiologist  Anesthesia Plan Comments:         Anesthesia Quick Evaluation  Anesthesia Physical Anesthesia Plan  ASA: III  Anesthesia Plan: General   Post-op Pain Management:    Induction: Intravenous  PONV Risk Score and Plan: 2 and  Ondansetron, Dexamethasone and Treatment may vary due to age or medical condition  Airway Management Planned: LMA  Additional Equipment:   Intra-op Plan:   Post-operative Plan: Extubation in OR  Informed Consent:   Plan Discussed with:   Anesthesia Plan Comments:        Anesthesia Quick Evaluation

## 2020-04-01 NOTE — Progress Notes (Signed)
Patient ID: Jason [name], male   DOB: May 13, 1945, 75 y.o.   MRN: [id number] [facility] PROGRESS NOTE  KAYVEON LENNARTZ ZJQ:[id number] DOB: Sep 10, 1945 DOA: 03/29/2020 PCP: Marina Goodell, MD  HPI/Subjective: Patient feels okay.  Has some discomfort in his right foot.  Patient came in and was diagnosed with osteomyelitis.  Objective: Vitals:   04/01/20 0749 04/01/20 1119  BP: (!) 169/80 (!) 147/72  Pulse: 61 (!) 54  Resp: 18 15  Temp: 98.9 F (37.2 C) 97.8 F (36.6 C)  SpO2: 99% 98%    Intake/Output Summary (Last 24 hours) at 04/01/2020 1550 Last data filed at 04/01/2020 1411 Gross per 24 hour  Intake 1199.85 ml  Output 1025 ml  Net 174.85 ml   Filed Weights   03/29/20 1514 03/29/20 2315 03/31/20 1548  Weight: 99.8 kg 96.4 kg 96.4 kg    ROS: Review of Systems  Respiratory: Negative for shortness of breath.   Cardiovascular: Negative for chest pain.  Gastrointestinal: Negative for abdominal pain, nausea and vomiting.   Exam: Physical Exam HENT:     Head: Normocephalic.     Mouth/Throat:     Pharynx: No oropharyngeal exudate.  Eyes:     General: Lids are normal.     Conjunctiva/sclera: Conjunctivae normal.     Pupils: Pupils are equal, round, and reactive to light.  Cardiovascular:     Rate and Rhythm: Normal rate and regular rhythm.     Heart sounds: Normal heart sounds, S1 normal and S2 normal.  Pulmonary:     Breath sounds: Normal breath sounds. No decreased breath sounds, wheezing, rhonchi or rales.  Abdominal:     Palpations: Abdomen is soft.     Tenderness: There is no abdominal tenderness.  Musculoskeletal:     Right ankle: Swelling present.     Left ankle: No swelling.  Skin:    General: Skin is warm.     Findings: No rash.     Comments: Right foot covered with bandage.  Neurological:     Mental Status: He is alert and oriented to person, place, and time.       Data Reviewed: Basic Metabolic Panel: Recent Labs  Lab 03/29/20 1616  03/30/20 0515 03/31/20 0724 04/01/20 0538  NA 137 137  --  138  K 3.7 3.7  --  3.7  CL 103 107  --  110  CO2 26 23  --  23  GLUCOSE 104* 197*  --  174*  BUN 22 17  --  17  CREATININE 0.87 0.73 0.87 0.82  CALCIUM 9.7 9.0  --  8.8*  MG 1.5* 1.5* 1.8  --   PHOS  --  2.9  --   --    Liver Function Tests: Recent Labs  Lab 03/29/20 1616 03/30/20 0515  AST 22 19  ALT 22 19  ALKPHOS 79 75  BILITOT 0.7 0.7  PROT 7.6 6.8  ALBUMIN 3.3* 2.9*   CBC: Recent Labs  Lab 03/29/20 1616 03/30/20 0515  WBC 6.2 4.9  NEUTROABS 4.0 3.0  HGB 11.8* 11.6*  HCT 35.3* 32.7*  MCV 86.7 84.7  PLT 286 259    CBG: Recent Labs  Lab 03/31/20 2032 03/31/20 2330 04/01/20 0428 04/01/20 0749 04/01/20 1120  GLUCAP 169* 159* 186* 146* 204*    Recent Results (from the past 240 hour(s))  SARS CORONAVIRUS 2 (TAT 6-24 HRS) Nasopharyngeal Nasopharyngeal Swab     Status: None   Collection Time: 03/29/20  7:41 PM  Specimen: Nasopharyngeal Swab  Result Value Ref Range Status   SARS Coronavirus 2 NEGATIVE NEGATIVE Final    Comment: (NOTE) SARS-CoV-2 target nucleic acids are NOT DETECTED.  The SARS-CoV-2 RNA is generally detectable in upper and lower respiratory specimens during the acute phase of infection. Negative results do not preclude SARS-CoV-2 infection, do not rule out co-infections with other pathogens, and should not be used as the sole basis for treatment or other patient management decisions. Negative results must be combined with clinical observations, patient history, and epidemiological information. The expected result is Negative.  Fact Sheet for Patients: HairSlick.no  Fact Sheet for Healthcare Providers: quierodirigir.com  This test is not yet approved or cleared by the [place] FDA and  has been authorized for detection and/or diagnosis of SARS-CoV-2 by FDA under an Emergency Use Authorization (EUA). This EUA will  remain  in effect (meaning this test can be used) for the duration of the COVID-19 declaration under Se ction 564(b)(1) of the Act, 21 U.S.C. section 360bbb-3(b)(1), unless the authorization is terminated or revoked sooner.  Performed at [facility], [address] [place], [place] [address]   Blood culture (routine x 2)     Status: None (Preliminary result)   Collection Time: 03/29/20  7:41 PM   Specimen: BLOOD  Result Value Ref Range Status   Specimen Description BLOOD LEFT ANTECUBITAL  Final   Special Requests   Final    BOTTLES DRAWN AEROBIC AND ANAEROBIC Blood Culture results may not be optimal due to an inadequate volume of blood received in culture bottles   Culture   Final    NO GROWTH 3 DAYS Performed at [facility], [address] [place], [place] [address]    Report Status PENDING  Incomplete  Blood culture (routine x 2)     Status: None (Preliminary result)   Collection Time: 03/29/20  7:41 PM   Specimen: BLOOD  Result Value Ref Range Status   Specimen Description BLOOD BLOOD RIGHT FOREARM  Final   Special Requests   Final    BOTTLES DRAWN AEROBIC AND ANAEROBIC Blood Culture adequate volume   Culture   Final    NO GROWTH 3 DAYS Performed at [facility], [address] [place], [place] [address]    Report Status PENDING  Incomplete     Studies: PERIPHERAL VASCULAR CATHETERIZATION  Result Date: 03/31/2020 See op note   Scheduled Meds: . amLODipine  5 mg Oral Daily  . vitamin C  250 mg Oral BID  . insulin aspart  0-9 Units Subcutaneous Q4H  . insulin glargine  5 Units Subcutaneous QHS  . multivitamin-lutein  1 capsule Oral Daily  . pravastatin  40 mg Oral Daily  . propranolol  40 mg Oral TID  . Ensure Max Protein  11 oz Oral BID  . ramipril  10 mg Oral Daily   Continuous Infusions: . sodium chloride 10 mL/hr at 04/01/20 0317  . ceFEPime (MAXIPIME) IV 2 g (04/01/20 1304)  . vancomycin 1,250 mg (04/01/20 1051)   Brief  history.  Patient admitted 03/29/2020 with foot pain and drainage from wound lateral right foot.  He was started on antibiotics.  MRI consistent with osteomyelitis of the cuboid, fifth metatarsal remnant, third and fourth metatarsals fourth toe and proximal third toe.  Patient had an angiogram on 04/01/2019 and had 3 angioplasties.  Patient to be set up for amputation on 04/02/2020.  Past medical history of peripheral vascular disease, hypertension hyperlipidemia and  type 2 diabetes mellitus and CAD.  Assessment/Plan:  1. 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  Patient to have a transmet amputation tomorrow by podiatry.  Will get ID consultation.  Patient currently on vancomycin and cefepime. 2. Type 2 diabetes mellitus with hyperlipidemia.  On pravastatin.  On low-dose Lantus.  Holding glimepiride and Metformin for now. 3. Peripheral vascular disease.  Had 3 angioplasties yesterday.  Holding aspirin and Plavix for amputation tomorrow.  Restart as soon as possible after procedure. 4. Essential hypertension on Norvasc, propranolol and ramipril.    Code Status:     Code Status Orders  (From admission, onward)         Start     Ordered   03/29/20 2341  Full code  Continuous        03/29/20 2340        Code Status History    Date Active Date Inactive Code Status Order ID Comments User Context   08/20/2016 1324 08/24/2016 2106 Full Code [id number]  Altamese Dilling, MD Inpatient   Advance Care Planning Activity     Family Communication: Spoke with daughter about case Disposition Plan: Status is: Inpatient  Dispo: The patient is from: home              Anticipated d/c is to: home              Patient currently going to go for amputation tomorrow   Difficult to place patient.  No.  Time spent: 27 minutes  [employer]

## 2020-04-01 NOTE — Progress Notes (Signed)
Daily Progress Note   Subjective  - 1 Day Post-Op  Follow-up right foot osteomyelitis.  Underwent revascularization yesterday.  Objective Vitals:   04/01/20 0035 04/01/20 0426 04/01/20 0749 04/01/20 1119  BP: (!) 159/66 134/69 (!) 169/80 (!) 147/72  Pulse: 67 67 61 (!) 54  Resp: 18 18 18 15   Temp: 98.2 F (36.8 C) 98.6 F (37 C) 98.9 F (37.2 C) 97.8 F (36.6 C)  TempSrc:   Oral Oral  SpO2: 98% 95% 99% 98%  Weight:      Height:        Physical Exam: Wound is stable. MRI: 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  Negative for abscess or osteomyelitis with skin wounds and small foci of soft tissue gas in the lateral aspect of the foot noted.   Laboratory CBC    Component Value Date/Time   WBC 4.9 03/30/2020 0515   HGB 11.6 (L) 03/30/2020 0515   HCT 32.7 (L) 03/30/2020 0515   PLT 259 03/30/2020 0515    BMET    Component Value Date/Time   NA 138 04/01/2020 0538   K 3.7 04/01/2020 0538   CL 110 04/01/2020 0538   CO2 23 04/01/2020 0538   GLUCOSE 174 (H) 04/01/2020 0538   BUN 17 04/01/2020 0538   CREATININE 0.82 04/01/2020 0538   CALCIUM 8.8 (L) 04/01/2020 0538   GFRNONAA >60 04/01/2020 0538   GFRAA >60 08/23/2016 0409    Assessment/Planning: Osteomyelitis right foot Severe peripheral vascular disease Diabetes with neuropathy   Patient underwent revascularization yesterday.  MRI is quite concerning for diffuse osteomyelitis including the remnants of the fifth metatarsal base and cuboid region.  Obvious osteomyelitis of the fourth metatarsal and third metatarsal.  I had a long discussion with the patient in regards to this severe infection into the forefoot and his residual diminished circulation although vascular surgery was able to increase flow to his foot.  At  this time would recommend transmetatarsal amputation with biopsy of bones for bone culture and evaluation of the lateral column.  I discussed this would be his best attempts at limb salvage.  I discussed this is at risk of undergoing further surgical involvement including but not limited to amputation of the lower leg.  Patient is in agreement and this will be planned for tomorrow afternoon.  Dr. 08/25/2016 to to perform surgery.  I discussed this with Dr. Excell Seltzer as well and orders have been placed and patient is on the schedule for tomorrow.    Excell Seltzer A  04/01/2020, 1:44 PM

## 2020-04-01 NOTE — Consult Note (Signed)
NAME: Jason [name]  DOB: November 11, 1945  MRN: [id number]  Date/Time: 04/01/2020 3:25 PM  REQUESTING PROVIDER: Dr. Renae Gloss Subjective:  REASON FOR CONSULT: Right foot infection ? Jason [name] is a 75 y.o. with a history of hypertension, diabetes mellitus, hyperlipidemia, CAD, peripheral arterial disease presented to the ED on 03/29/2020 by private vehicle because of a sore on his foot which started to bleed.  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 Patient presented to the podiatrist from March 17 and had x-rays done which showed cortical erosion at the head of the fourth metatarsal and lateral base of the proximal phalanx of the fourth toe with pathological fracture.  There was also some calcified small blood vessels noted throughout the midfoot.  He was started on Cipro and Augmentin.  He wanted to get an ABI on Friday but as it did not work out he asked him to go to the ED. In the ED vitals BP 173/91, temp 97.9, heart rate 69, sats 100%.  WBC 6.2, Hb 11.8, platelet 286, creatinine 0.87, glucose 188.  CRP was 6. Blood culture was sent and patient was started on vancomycin and cefepime.  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  There was no abscess noted. He was seen by vascular and underwent angiogram and also angioplasty of the anterior tibial, peroneal and popliteal on the right side. Dr. Ether Griffins after evaluating the MRI and also finding the patient having peripheral artery disease recommends TMA. I am asked to see the patient by Dr. Renae Gloss for further management. Past Medical History:  Diagnosis Date  . CAD (coronary artery disease)   . Colon polyp   . Diabetes mellitus without complication (HCC)   . Hyperlipemia   . Hypertension   .  Orchitis   . PVD (peripheral vascular disease) (HCC)   . Tremor     Past Surgical History:  Procedure Laterality Date  . AMPUTATION Right 08/21/2016   Procedure: RAY RESECTION-RIGHT 5TH RAY;  Surgeon: Linus Galas, DPM;  Location: [facility] ORS;  Service: Podiatry;  Laterality: Right;  . AMPUTATION TOE Left    big  . CAROTID ENDARTERECTOMY Right   . COLON SURGERY    . CORONARY ARTERY BYPASS GRAFT    . LOWER EXTREMITY ANGIOGRAPHY Right 03/31/2020   Procedure: Lower Extremity Angiography;  Surgeon: Annice Needy, MD;  Location: [facility];  Service: Cardiovascular;  Laterality: Right;  . ROTATOR CUFF REPAIR Right     Social History   Socioeconomic History  . Marital status: Married    Spouse name: Not on file  . Number of children: Not on file  . Years of education: Not on file  . Highest education level: Not on file  Occupational History  . Occupation: IT sales professional  Tobacco Use  . Smoking status: Never Smoker  . Smokeless tobacco: Never Used  Substance and Sexual Activity  . Alcohol use: No  . Drug use: No  . Sexual activity: Not on file  Other Topics Concern  . Not on file  Social History Narrative  . Not on file   Social Determinants of Health   Financial Resource Strain: Not on file  Food Insecurity: Not on file  Transportation Needs: Not on file  Physical Activity: Not on file  Stress: Not on file  Social Connections: Not on file  Intimate Partner Violence: Not on file    Family History  Problem Relation Age of Onset  . Diabetes Mother   . Diabetes Father    No Known Allergies I? Current Facility-Administered Medications  Medication Dose Route Frequency Provider Last Rate Last Admin  . 0.9 %  sodium chloride infusion   Intravenous PRN Alford Highland, MD 10 mL/hr at 04/01/20 0317 Infusion Verify at 04/01/20 0317  . acetaminophen (TYLENOL) tablet 650 mg  650 mg Oral Q6H PRN Annice Needy, MD       Or  . acetaminophen (TYLENOL) suppository 650 mg  650  mg Rectal Q6H PRN Annice Needy, MD      . amLODipine (NORVASC) tablet 5 mg  5 mg Oral Daily Annice Needy, MD   5 mg at 04/01/20 0801  . ascorbic acid (VITAMIN C) tablet 250 mg  250 mg Oral BID Annice Needy, MD   250 mg at 04/01/20 2202  . ceFEPIme (MAXIPIME) 2 g in sodium chloride 0.9 % 100 mL IVPB  2 g Intravenous Q8H Annice Needy, MD 200 mL/hr at 04/01/20 1304 2 g at 04/01/20 1304  . HYDROcodone-acetaminophen (NORCO/VICODIN) 5-325 MG per tablet 1-2 tablet  1-2 tablet Oral Q4H PRN Annice Needy, MD      . HYDROmorphone (DILAUDID) injection 1 mg  1 mg Intravenous Once PRN Annice Needy, MD      . insulin aspart (novoLOG) injection 0-9 Units  0-9 Units Subcutaneous Q4H Annice Needy, MD   3 Units at 04/01/20 1257  . insulin glargine (LANTUS) injection 5 Units  5 Units Subcutaneous QHS Annice Needy, MD   5 Units at 03/31/20 2324  . multivitamin-lutein (OCUVITE-LUTEIN) capsule 1 capsule  1 capsule Oral Daily Annice Needy, MD   1 capsule at 04/01/20 1259  . ondansetron (ZOFRAN) injection 4 mg  4 mg Intravenous Q6H PRN Annice Needy, MD      . pravastatin (PRAVACHOL) tablet 40 mg  40 mg Oral Daily Annice Needy, MD   40 mg at 04/01/20 0804  . propranolol (INDERAL) tablet 40 mg  40 mg Oral TID Annice Needy, MD   40 mg at 04/01/20 0802  . protein supplement (ENSURE MAX) liquid  11 oz Oral BID Annice Needy, MD      . ramipril (ALTACE) capsule 10 mg  10 mg Oral Daily Annice Needy, MD   10 mg at 04/01/20 0801  . vancomycin (VANCOREADY) IVPB 1250 mg/250 mL  1,250 mg Intravenous Q12H Annice Needy, MD 166.7 mL/hr at 04/01/20 1051 1,250 mg at 04/01/20 1051     Abtx:  Anti-infectives (From admission, onward)   Start     Dose/Rate Route Frequency Ordered Stop   03/30/20 2100  vancomycin (VANCOREADY) IVPB 1500 mg/300 mL  Status:  Discontinued        1,500 mg 150 mL/hr over 120 Minutes Intravenous Every 24 hours 03/29/20 2058 03/30/20 1013   03/30/20 1100  vancomycin (VANCOREADY) IVPB 1250 mg/250 mL        1,250  mg 166.7 mL/hr over 90 Minutes Intravenous Every 12 hours 03/30/20 1013     03/30/20 0600  ceFEPIme (MAXIPIME) 2 g in sodium chloride 0.9 % 100 mL IVPB        2 g 200 mL/hr over 30 Minutes Intravenous Every 8 hours 03/29/20 2058  03/29/20 2015  ceFEPIme (MAXIPIME) 2 g in sodium chloride 0.9 % 100 mL IVPB        2 g 200 mL/hr over 30 Minutes Intravenous  Once 03/29/20 2004 03/29/20 2121   03/29/20 2015  vancomycin (VANCOREADY) IVPB 2000 mg/400 mL        2,000 mg 200 mL/hr over 120 Minutes Intravenous  Once 03/29/20 2004 03/29/20 2252      REVIEW OF SYSTEMS:  Const: negative fever, negative chills, negative weight loss Eyes: negative diplopia or visual changes, negative eye pain ENT: negative coryza, negative sore throat Resp: negative cough, hemoptysis, dyspnea Cards: negative for chest pain, palpitations, lower extremity edema GU: negative for frequency, dysuria and hematuria GI: Negative for abdominal pain, diarrhea, bleeding, constipation Skin: negative for rash and pruritus Heme: negative for easy bruising and gum/nose bleeding MS: negative for myalgias, arthralgias, back pain and muscle weakness Neurolo:negative for headaches, dizziness, vertigo, memory problems  Psych: negative for feelings of anxiety, depression  Endocrine: negative for thyroid, diabetes Allergy/Immunology- negative for any medication or food allergies ? Objective:  VITALS:  BP (!) 147/72 (BP Location: Right Arm)   Pulse (!) 54   Temp 97.8 F (36.6 C) (Oral)   Resp 15   Ht 5\' 10"  (1.778 m)   Wt 96.4 kg   SpO2 98%   BMI 30.49 kg/m  PHYSICAL EXAM:  General: Alert, cooperative, no distress, appears stated age.  Head: Normocephalic, without obvious abnormality, atraumatic. Eyes: Conjunctivae clear, anicteric sclerae. Pupils are equal ENT Nares normal. No drainage or sinus tenderness. Lips, mucosa, and tongue normal. No Thrush Neck: Supple, symmetrical, no adenopathy, thyroid: non tender no  carotid bruit and no JVD. Back: No CVA tenderness. Lungs: Clear to auscultation bilaterally. No Wheezing or Rhonchi. No rales. Heart: Regular rate and rhythm, no murmur, rub or gallop. Abdomen: Soft, non-tender,not distended. Bowel sounds normal. No masses Extremities: rt foot- 5th toe amputated- at the stump site there is a wound      Skin: No rashes or lesions. Or bruising Lymph: Cervical, supraclavicular normal. Neurologic: Grossly non-focal Pertinent Labs Lab Results CBC    Component Value Date/Time   WBC 4.9 03/30/2020 0515   RBC 3.86 (L) 03/30/2020 0515   HGB 11.6 (L) 03/30/2020 0515   HCT 32.7 (L) 03/30/2020 0515   PLT 259 03/30/2020 0515   MCV 84.7 03/30/2020 0515   MCH 30.1 03/30/2020 0515   MCHC 35.5 03/30/2020 0515   RDW 11.8 03/30/2020 0515   LYMPHSABS 1.0 03/30/2020 0515   MONOABS 0.6 03/30/2020 0515   EOSABS 0.3 03/30/2020 0515   BASOSABS 0.0 03/30/2020 0515    CMP Latest Ref Rng & Units 04/01/2020 03/31/2020 03/30/2020  Glucose 70 - 99 mg/dL 191(Y174(H) - 782(N197(H)  BUN 8 - 23 mg/dL 17 - 17  Creatinine 5.620.61 - 1.24 mg/dL 1.300.82 8.650.87 7.840.73  Sodium 135 - 145 mmol/L 138 - 137  Potassium 3.5 - 5.1 mmol/L 3.7 - 3.7  Chloride 98 - 111 mmol/L 110 - 107  CO2 22 - 32 mmol/L 23 - 23  Calcium 8.9 - 10.3 mg/dL 6.9(G8.8(L) - 9.0  Total Protein 6.5 - 8.1 g/dL - - 6.8  Total Bilirubin 0.3 - 1.2 mg/dL - - 0.7  Alkaline Phos 38 - 126 U/L - - 75  AST 15 - 41 U/L - - 19  ALT 0 - 44 U/L - - 19      Microbiology: Recent Results (from the past 240 hour(s))  SARS CORONAVIRUS 2 (TAT 6-24 HRS)  Nasopharyngeal Nasopharyngeal Swab     Status: None   Collection Time: 03/29/20  7:41 PM   Specimen: Nasopharyngeal Swab  Result Value Ref Range Status   SARS Coronavirus 2 NEGATIVE NEGATIVE Final    Comment: (NOTE) SARS-CoV-2 target nucleic acids are NOT DETECTED.  The SARS-CoV-2 RNA is generally detectable in upper and lower respiratory specimens during the acute phase of infection.  Negative results do not preclude SARS-CoV-2 infection, do not rule out co-infections with other pathogens, and should not be used as the sole basis for treatment or other patient management decisions. Negative results must be combined with clinical observations, patient history, and epidemiological information. The expected result is Negative.  Fact Sheet for Patients: HairSlick.no  Fact Sheet for Healthcare Providers: quierodirigir.com  This test is not yet approved or cleared by the [place] FDA and  has been authorized for detection and/or diagnosis of SARS-CoV-2 by FDA under an Emergency Use Authorization (EUA). This EUA will remain  in effect (meaning this test can be used) for the duration of the COVID-19 declaration under Se ction 564(b)(1) of the Act, 21 U.S.C. section 360bbb-3(b)(1), unless the authorization is terminated or revoked sooner.  Performed at [facility], [address] [place], [place] [address]   Blood culture (routine x 2)     Status: None (Preliminary result)   Collection Time: 03/29/20  7:41 PM   Specimen: BLOOD  Result Value Ref Range Status   Specimen Description BLOOD LEFT ANTECUBITAL  Final   Special Requests   Final    BOTTLES DRAWN AEROBIC AND ANAEROBIC Blood Culture results may not be optimal due to an inadequate volume of blood received in culture bottles   Culture   Final    NO GROWTH 3 DAYS Performed at [facility], [address], [place], [place] [address]    Report Status PENDING  Incomplete  Blood culture (routine x 2)     Status: None (Preliminary result)   Collection Time: 03/29/20  7:41 PM   Specimen: BLOOD  Result Value Ref Range Status   Specimen Description BLOOD BLOOD RIGHT FOREARM  Final   Special Requests   Final    BOTTLES DRAWN AEROBIC AND ANAEROBIC Blood Culture adequate volume   Culture   Final    NO GROWTH 3 DAYS Performed at [facility], [address], [place], [place] [address]    Report Status PENDING  Incomplete    IMAGING RESULTS: 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  I have personally reviewed the films ? Impression/Recommendation ? ?Right foot infection with underlying diabetes mellitus and peripheral arterial disease. No systemic infection- no fever or leucocytosis Already had fifth toe amputation 2 years ago.  Now has infection at the stump site as well as MRI showing infection of the fourth toe and cuboid bone. Underwent angioplasty of the right anterior tibial, peroneal and popliteal. Currently on vancomycin and cefepime. Sent culture- change cefepime to unasyn as risk for gram neg is less likely  Diabetes mellitus on insulin  Hypertension ramipril, amlodipine  CAD on pravastatin and propranolol ? ___________________________________________________ Discussed with patient, requesting provider Note:  This document was prepared using Dragon voice recognition software and may include unintentional dictation errors.

## 2020-04-01 NOTE — Care Management Important Message (Signed)
Important Message  Patient Details  Name: Jason [name] MRN: [id number] Date of Birth: [birth year]   Medicare Important Message Given:  Yes     Olegario Messier A Ronny Ruddell 04/01/2020, 10:28 AM

## 2020-04-01 NOTE — Progress Notes (Signed)
[facility] Daily Progress Note  Subjective: 03/31/20: 1. Ultrasound guidance for vascular access left femoral artery 2. Catheter placement into right common femoral artery from left femoral approach 3. Aortogram and selective right lower extremity angiogram 4. Percutaneous transluminal angioplasty of right anterior tibial artery with 2.24mm x 30 cm length angioplasty balloon throughout and a 3 mm diameter by 15 cm length angioplasty balloon proximally 5.  Percutaneous transluminal angioplasty of right mid peroneal artery with 2.5 mm diameter angioplasty balloon             6.  Percutaneous transluminal angioplasty of the right popliteal artery and tibioperoneal trunk with 4 mm diameter by 8 cm length Lutonix drug-coated angioplasty balloon 7. StarClose closure device left femoral artery  Patient without complaint this AM.  No issues overnight.  Objective: Vitals:   03/31/20 2017 04/01/20 0035 04/01/20 0426 04/01/20 0749  BP: (!) 126/54 (!) 159/66 134/69 (!) 169/80  Pulse: 65 67 67 61  Resp: 20 18 18 18   Temp: 98.3 F (36.8 C) 98.2 F (36.8 C) 98.6 F (37 C) 98.9 F (37.2 C)  TempSrc: Oral   Oral  SpO2: 99% 98% 95% 99%  Weight:      Height:        Intake/Output Summary (Last 24 hours) at 04/01/2020 1104 Last data filed at 04/01/2020 1022 Gross per 24 hour  Intake 959.85 ml  Output 725 ml  Net 234.85 ml   Physical Exam: A&Ox3, NAD CV: RRR Pulmonary: CTA Bilaterally Abdomen: Soft, Nontender, Nondistended Left groin:  Access site: Pain dry and intact. Vascular:  Right lower extremity: Thigh soft. Calf soft. Extremity is warm distally to toes. Motor/sensory is intact. There is no acute vascular compromise noted to extremity at this time.   Laboratory: CBC    Component Value Date/Time   WBC 4.9 03/30/2020 0515   HGB 11.6 (L) 03/30/2020 0515   HCT 32.7 (L) 03/30/2020 0515    PLT 259 03/30/2020 0515   BMET    Component Value Date/Time   NA 138 04/01/2020 0538   K 3.7 04/01/2020 0538   CL 110 04/01/2020 0538   CO2 23 04/01/2020 0538   GLUCOSE 174 (H) 04/01/2020 0538   BUN 17 04/01/2020 0538   CREATININE 0.82 04/01/2020 0538   CALCIUM 8.8 (L) 04/01/2020 0538   GFRNONAA >60 04/01/2020 0538   GFRAA >60 08/23/2016 0409   Assessment/Planning: Patient is a 75 y.o.male with nonhealing ulceration and infection of the right foot with nonpalpable pedal pulses status post endovascular intervention - POD#1  1) successful revascularization of the right lower extremity 2) on statin, aspirin and Plavix for medical management 3) we will continue to follow the patient in the outpatient setting 4) no further recommendations from vascular at this time  Discussed with Dr. 08/25/2016 Ivin Rosenbloom PA-C 04/01/2020 11:04 AM

## 2020-04-01 NOTE — Progress Notes (Signed)
Pharmacy Antibiotic Note  Jason [name] is a 75 y.o. male admitted on 03/29/2020 with cellulitis of R foot .  Pharmacy has been consulted for vancomycin and cefepime dosing.  Pt afebrile, WBC WNL  Plan:  Day 3- Cefepime 2g IV q8h  Day 3- Vancomycin 2g bolus then vanc 1500 mg IV q24H. Adjusted to 1250 mg q12H with a predicted AUC of 487. Goal AUC of 400-550. (used Vd 0.72 and Scr 0.8 for calculation). Plan to obtain vancomycin level in the next 2-3 days.   3/22: s/p angiogram/PTCA LE arteries 3/21. Likely amputation 3/23. F/u- if Vancomycin continued after surgery then f/u levels.    Height: 5\' 10"  (177.8 cm) Weight: 96.4 kg (212 lb 8.4 oz) IBW/kg (Calculated) : 73  Temp (24hrs), Avg:98.3 F (36.8 C), Min:97.8 F (36.6 C), Max:98.9 F (37.2 C)  Recent Labs  Lab 03/29/20 1616 03/30/20 0515 03/31/20 0724 04/01/20 0538  WBC 6.2 4.9  --   --   CREATININE 0.87 0.73 0.87 0.82    Estimated Creatinine Clearance: 92.1 mL/min (by C-G formula based on SCr of 0.82 mg/dL).    No Known Allergies  Antimicrobials this admission: Vancomycin 3/19 (evening) >> Cefepime 3/19 (evening)>> (cipro and augmentin prior to admission)  Microbiology results: 3/19 BCx NGTD   4/19 PharmD Clinical Pharmacist 04/01/2020

## 2020-04-02 ENCOUNTER — Inpatient Hospital Stay: Payer: Medicare HMO | Admitting: Anesthesiology

## 2020-04-02 ENCOUNTER — Encounter: Payer: Self-pay | Admitting: Internal Medicine

## 2020-04-02 ENCOUNTER — Encounter
Admission: EM | Disposition: A | Discharge status: Home Health | Payer: Self-pay | Source: Home / Self Care | Attending: Internal Medicine

## 2020-04-02 ENCOUNTER — Inpatient Hospital Stay: Payer: Medicare HMO

## 2020-04-02 DIAGNOSIS — M86072 Acute hematogenous osteomyelitis, left ankle and foot: Secondary | ICD-10-CM | POA: Diagnosis not present

## 2020-04-02 HISTORY — PX: TRANSMETATARSAL AMPUTATION: SHX6197

## 2020-04-02 HISTORY — PX: ACHILLES TENDON SURGERY: SHX542

## 2020-04-02 LAB — GLUCOSE, CAPILLARY
Glucose-Capillary: 115 mg/dL — ABNORMAL HIGH (ref 70–99)
Glucose-Capillary: 130 mg/dL — ABNORMAL HIGH (ref 70–99)
Glucose-Capillary: 132 mg/dL — ABNORMAL HIGH (ref 70–99)
Glucose-Capillary: 145 mg/dL — ABNORMAL HIGH (ref 70–99)
Glucose-Capillary: 155 mg/dL — ABNORMAL HIGH (ref 70–99)
Glucose-Capillary: 157 mg/dL — ABNORMAL HIGH (ref 70–99)
Glucose-Capillary: 169 mg/dL — ABNORMAL HIGH (ref 70–99)
Glucose-Capillary: 220 mg/dL — ABNORMAL HIGH (ref 70–99)

## 2020-04-02 LAB — CREATININE, SERUM
Creatinine, Ser: 0.83 mg/dL (ref 0.61–1.24)
GFR, Estimated: >60 mL/min (ref >60)

## 2020-04-02 LAB — SURGICAL PCR SCREEN
MRSA, PCR: NEGATIVE
Staphylococcus aureus: NEGATIVE

## 2020-04-02 SURGERY — AMPUTATION, FOOT, TRANSMETATARSAL
Anesthesia: General | Site: Toe | Laterality: Right

## 2020-04-02 MED ORDER — PROPOFOL 500 MG/50ML IV EMUL
INTRAVENOUS | Status: DC | PRN
  Administered 2020-04-02: 40 mg via INTRAVENOUS

## 2020-04-02 MED ORDER — PROPOFOL 10 MG/ML IV BOLUS
INTRAVENOUS | Status: AC
  Filled 2020-04-02: qty 20

## 2020-04-02 MED ORDER — VANCOMYCIN HCL 1000 MG IV SOLR
INTRAVENOUS | Status: DC | PRN
  Administered 2020-04-02: 1000 mg

## 2020-04-02 MED ORDER — PROPOFOL 500 MG/50ML IV EMUL
INTRAVENOUS | Status: DC | PRN
Start: 2020-04-02 — End: 2020-04-02
  Administered 2020-04-02: 120 ug/kg/min via INTRAVENOUS

## 2020-04-02 MED ORDER — FENTANYL CITRATE (PF) 100 MCG/2ML IJ SOLN
INTRAMUSCULAR | Status: DC | PRN
  Administered 2020-04-02 (×2): 50 ug via INTRAVENOUS

## 2020-04-02 MED ORDER — LIDOCAINE HCL (PF) 1 % IJ SOLN
INTRAMUSCULAR | Status: AC
  Filled 2020-04-02: qty 30

## 2020-04-02 MED ORDER — MUPIROCIN 2 % EX OINT
1.0000 "application " | TOPICAL_OINTMENT | Freq: Two times a day (BID) | CUTANEOUS | Status: DC
  Administered 2020-04-02 – 2020-04-05 (×7): 1 via NASAL
  Filled 2020-04-02: qty 22

## 2020-04-02 MED ORDER — MIDAZOLAM HCL 2 MG/2ML IJ SOLN
INTRAMUSCULAR | Status: AC
  Filled 2020-04-02: qty 2

## 2020-04-02 MED ORDER — GLYCOPYRROLATE 0.2 MG/ML IJ SOLN
INTRAMUSCULAR | Status: DC | PRN
  Administered 2020-04-02: .2 mg via INTRAVENOUS

## 2020-04-02 MED ORDER — SODIUM CHLORIDE 0.9 % IV SOLN: INTRAVENOUS | Status: DC | PRN

## 2020-04-02 MED ORDER — EPHEDRINE 5 MG/ML INJ
INTRAVENOUS | Status: AC
  Filled 2020-04-02: qty 10

## 2020-04-02 MED ORDER — LIDOCAINE HCL (PF) 2 % IJ SOLN
INTRAMUSCULAR | Status: AC
  Filled 2020-04-02: qty 5

## 2020-04-02 MED ORDER — ONDANSETRON HCL 4 MG/2ML IJ SOLN
INTRAMUSCULAR | Status: AC
  Filled 2020-04-02: qty 2

## 2020-04-02 MED ORDER — BUPIVACAINE HCL (PF) 0.5 % IJ SOLN
INTRAMUSCULAR | Status: DC | PRN
  Administered 2020-04-02: 20 mL

## 2020-04-02 MED ORDER — CHLORHEXIDINE GLUCONATE 4 % EX LIQD
60.0000 mL | Freq: Once | CUTANEOUS | Status: AC
  Administered 2020-04-02: 4 via TOPICAL

## 2020-04-02 MED ORDER — FENTANYL CITRATE (PF) 100 MCG/2ML IJ SOLN
INTRAMUSCULAR | Status: AC
  Filled 2020-04-02: qty 2

## 2020-04-02 MED ORDER — FENTANYL CITRATE (PF) 100 MCG/2ML IJ SOLN
25.0000 ug | INTRAMUSCULAR | Status: DC | PRN
  Administered 2020-04-02: 50 ug via INTRAVENOUS

## 2020-04-02 MED ORDER — EPHEDRINE SULFATE 50 MG/ML IJ SOLN
INTRAMUSCULAR | Status: DC | PRN
  Administered 2020-04-02 (×2): 10 mg via INTRAVENOUS

## 2020-04-02 MED ORDER — VANCOMYCIN HCL 1000 MG IV SOLR
INTRAVENOUS | Status: AC
  Filled 2020-04-02: qty 1000

## 2020-04-02 MED ORDER — ONDANSETRON HCL 4 MG/2ML IJ SOLN: 4.0000 mg | Freq: Once | INTRAMUSCULAR | Status: DC | PRN

## 2020-04-02 MED ORDER — GLYCOPYRROLATE 0.2 MG/ML IJ SOLN
INTRAMUSCULAR | Status: AC
  Filled 2020-04-02: qty 1

## 2020-04-02 MED ORDER — ONDANSETRON HCL 4 MG/2ML IJ SOLN
INTRAMUSCULAR | Status: DC | PRN
  Administered 2020-04-02: 4 mg via INTRAVENOUS

## 2020-04-02 MED ORDER — BUPIVACAINE HCL (PF) 0.5 % IJ SOLN
INTRAMUSCULAR | Status: AC
  Filled 2020-04-02: qty 30

## 2020-04-02 MED ORDER — FENTANYL CITRATE (PF) 100 MCG/2ML IJ SOLN
INTRAMUSCULAR | Status: AC
  Administered 2020-04-02: 25 ug via INTRAVENOUS
  Filled 2020-04-02: qty 2

## 2020-04-02 MED ORDER — JUVEN PO PACK
1.0000 | PACK | Freq: Two times a day (BID) | ORAL | Status: DC
  Administered 2020-04-03 – 2020-04-04 (×4): 1 via ORAL

## 2020-04-02 MED ORDER — ENSURE MAX PROTEIN PO LIQD
11.0000 [oz_av] | Freq: Every day | ORAL | Status: DC
  Administered 2020-04-03 – 2020-04-04 (×2): 11 [oz_av] via ORAL
  Filled 2020-04-02: qty 330

## 2020-04-02 SURGICAL SUPPLY — 41 items
BAG COUNTER SPONGE EZ (MISCELLANEOUS) IMPLANT
BLADE OSCILLATING/SAGITTAL (BLADE) ×1
BLADE SURG 15 STRL LF DISP TIS (BLADE) ×8 IMPLANT
BLADE SURG 15 STRL SS (BLADE) ×4
BLADE SW THK.38XMED LNG THN (BLADE) ×2 IMPLANT
BNDG COHESIVE 4X5 TAN STRL (GAUZE/BANDAGES/DRESSINGS) ×3 IMPLANT
BNDG ELASTIC 4X5.8 VLCR STR LF (GAUZE/BANDAGES/DRESSINGS) ×3 IMPLANT
BNDG ELASTIC 6X5.8 VLCR STR LF (GAUZE/BANDAGES/DRESSINGS) ×3 IMPLANT
BNDG ESMARK 4X12 TAN STRL LF (GAUZE/BANDAGES/DRESSINGS) ×3 IMPLANT
BNDG GAUZE 4.5X4.1 6PLY STRL (MISCELLANEOUS) ×6 IMPLANT
BNDG STRETCH 4X75 STRL LF (GAUZE/BANDAGES/DRESSINGS) ×3 IMPLANT
BOOT STEPPER DURA LG (SOFTGOODS) ×3 IMPLANT
COVER WAND RF STERILE (DRAPES) ×3 IMPLANT
CUFF TOURN SGL QUICK 18X4 (TOURNIQUET CUFF) ×3 IMPLANT
DURAPREP 26ML APPLICATOR (WOUND CARE) ×3 IMPLANT
ELECT REM PT RETURN 9FT ADLT (ELECTROSURGICAL) ×3
ELECTRODE REM PT RTRN 9FT ADLT (ELECTROSURGICAL) ×2 IMPLANT
GAUZE SPONGE 4X4 12PLY STRL (GAUZE/BANDAGES/DRESSINGS) ×3 IMPLANT
GAUZE XEROFORM 1X8 LF (GAUZE/BANDAGES/DRESSINGS) ×3 IMPLANT
GLOVE SURG ENC MOIS LTX SZ7 (GLOVE) ×3 IMPLANT
GLOVE SURG UNDER LTX SZ7 (GLOVE) ×3 IMPLANT
GOWN STRL REUS W/ TWL LRG LVL3 (GOWN DISPOSABLE) ×4 IMPLANT
GOWN STRL REUS W/TWL LRG LVL3 (GOWN DISPOSABLE) ×2
HANDLE YANKAUER SUCT BULB TIP (MISCELLANEOUS) ×3 IMPLANT
KIT STIMULAN RAPID CURE 5CC (Orthopedic Implant) ×3 IMPLANT
KIT TURNOVER KIT A (KITS) ×3 IMPLANT
MANIFOLD NEPTUNE II (INSTRUMENTS) ×3 IMPLANT
NDL SAFETY ECLIPSE 18X1.5 (NEEDLE) ×2 IMPLANT
NEEDLE HYPO 18GX1.5 SHARP (NEEDLE) ×1
NEEDLE HYPO 25X1 1.5 SAFETY (NEEDLE) ×6 IMPLANT
NS IRRIG 500ML POUR BTL (IV SOLUTION) ×3 IMPLANT
PACK EXTREMITY ARMC (MISCELLANEOUS) ×3 IMPLANT
PAD ABD DERMACEA PRESS 5X9 (GAUZE/BANDAGES/DRESSINGS) ×3 IMPLANT
SPONGE LAP 18X18 RF (DISPOSABLE) ×3 IMPLANT
STAPLER SKIN PROX 35W (STAPLE) ×3 IMPLANT
STOCKINETTE M/LG 89821 (MISCELLANEOUS) ×3 IMPLANT
STRIP CLOSURE SKIN 1/4X4 (GAUZE/BANDAGES/DRESSINGS) ×3 IMPLANT
SUT ETHILON 3-0 FS-10 30 BLK (SUTURE) ×6
SUT VICRYL+ 3-0 36IN CT-1 (SUTURE) ×6 IMPLANT
SUTURE EHLN 3-0 FS-10 30 BLK (SUTURE) ×4 IMPLANT
SYR 10ML LL (SYRINGE) ×6 IMPLANT

## 2020-04-02 NOTE — Transfer of Care (Signed)
Immediate Anesthesia Transfer of Care Note  Patient: Jason [name]  Procedure(s) Performed: TRANSMETATARSAL AMPUTATION (Right Toe) ACHILLES LENGTHENING (Right )  Patient Location: PACU  Anesthesia Type:General  Level of Consciousness: drowsy  Airway & Oxygen Therapy: Patient Spontanous Breathing and Patient connected to face mask oxygen  Post-op Assessment: Report given to RN  Post vital signs: stable  Last Vitals:  Vitals Value Taken Time  BP 116/57 04/02/20 1756  Temp    Pulse 57 04/02/20 1758  Resp 16 04/02/20 1758  SpO2 100 % 04/02/20 1758  Vitals shown include unvalidated device data.  Last Pain:  Vitals:   04/02/20 1507  TempSrc: Tympanic  PainSc: 0-No pain         Complications: No complications documented.

## 2020-04-02 NOTE — Plan of Care (Signed)
?  Problem: Health Behavior/Discharge Planning: ?Goal: Ability to manage health-related needs will improve ?Outcome: Progressing ?  ?Problem: Nutrition: ?Goal: Adequate nutrition will be maintained ?Outcome: Progressing ?  ?Problem: Pain Managment: ?Goal: General experience of comfort will improve ?Outcome: Progressing ?  ?Problem: Safety: ?Goal: Ability to remain free from injury will improve ?Outcome: Progressing ?  ?

## 2020-04-02 NOTE — Progress Notes (Signed)
PROGRESS NOTE    Jason [name]  ZOX:[id number] DOB: 04/01/1945 DOA: 03/29/2020 PCP: Marina GoodellFeldpausch, Dale E, MD  Brief Narrative: (Start on day 1 of progress note - keep it brief and live) 75 y.o. male with medical history significant of CAD, DM, HTN, HDL, PVD,  tremor, CVA occlusion of basilar artery   Presented with acute on chronic wound in the right foot.  Was bleeding for a few days prior to presentation.  Had x-rays done as an outpatient was started on ciprofloxacin and Augmentin by his podiatrist.  Was admitted for ABIs and found to have significant peripheral arterial disease.  Now status post 3 angiographies revascularization.  MRI imaging survey concerning for diffuse osteomyelitis including remnants of fifth metatarsal base fourth and third metatarsal  Podiatry on consult with plan for transmetatarsal amputation with bone biopsy and culture.  Operative intervention planned for 3/23   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 diabetes mellitus, with muscle involvement without evidence of necrosis (HCC)   PVD (peripheral vascular disease) (HCC)  Acute osteomyelitis right foot Osteomyelitis involving multiple bones Status post angiography with revascularization with podiatry Plan: N.p.o. for transmetatarsal amputation with podiatry today Intraoperative bone biopsies and cultures to be taken Infectious disease on consult for antibiotic recommendations Currently on vancomycin and Unasyn  Type 2 diabetes mellitus with hyperglycemia and hyperlipidemia Plan: Hold home glimepiride and Metformin Continue basal bolus regimen Continue pravastatin  Peripheral vascular disease Status post  angioplasty x3 Currently aspirin and Plavix on hold Restart as soon as possible after procedure  Essential hypertension Well-controlled on current regimen Plan: Continue Norvasc, propanolol, ramipril   DVT prophylaxis: SCD Code Status: Full Family Communication: None today Disposition Plan: Status is: Inpatient  Remains inpatient appropriate because:Inpatient level of care appropriate due to severity of illness   Dispo: The patient is from: Home              Anticipated d/c is to: SNF              Patient currently is not medically stable to d/c.   Difficult to place patient No  OR today with podiatry for transmetatarsal amputation.  Disposition plan pending.  Anticipate need for skilled nursing facility at time of discharge.     Level of care: Med-Surg  Consultants:   ID  Podiatry  Vascular surgery  Procedures:   Angioplasty x3  Transmetatarsal amputation  Antimicrobials:   Vancomycin  Unasyn   Subjective: Patient seen and examined.  Pain well controlled.  N.p.o. for procedure.  Objective: Vitals:   04/02/20 0405 04/02/20 0811 04/02/20 1141 04/02/20 1507  BP: (!) 145/61 (!) 167/77 (!) 155/62 (!) 177/68  Pulse: (!) 57 (!) 54 (!) 51 (!) 55  Resp: 16 18 16 16   Temp: 98.2 F (36.8 C) 97.8 F (36.6 C) 97.9 F (36.6 C) (!) 96.8 F (36 C)  TempSrc: Oral Oral Oral Tympanic  SpO2: 97% 97% 99% 99%  Weight:      Height:        Intake/Output Summary (Last 24 hours) at 04/02/2020 1522 Last data filed at 04/02/2020 1400 Gross per 24 hour  Intake --  Output 1400 ml  Net -1400  ml   Filed Weights   03/29/20 1514 03/29/20 2315 03/31/20 1548  Weight: 99.8 kg 96.4 kg 96.4 kg    Examination:  General exam: Appears calm and comfortable  Respiratory system: Clear to auscultation. Respiratory effort normal. Cardiovascular system: S1 & S2 heard, RRR. No JVD, murmurs, rubs, gallops or clicks. No pedal edema. Gastrointestinal system: Abdomen is  nondistended, soft and nontender. No organomegaly or masses felt. Normal bowel sounds heard. Central nervous system: Alert and oriented. No focal neurological deficits. Extremities: Right ankle swollen, tender to touch.  Right foot covered with bandage Skin: No rashes, lesions or ulcers Psychiatry: Judgement and insight appear normal. Mood & affect appropriate.     Data Reviewed: I have personally reviewed following labs and imaging studies  CBC: Recent Labs  Lab 03/29/20 1616 03/30/20 0515  WBC 6.2 4.9  NEUTROABS 4.0 3.0  HGB 11.8* 11.6*  HCT 35.3* 32.7*  MCV 86.7 84.7  PLT 286 259   Basic Metabolic Panel: Recent Labs  Lab 03/29/20 1616 03/30/20 0515 03/31/20 0724 04/01/20 0538 04/02/20 0547  NA 137 137  --  138  --   K 3.7 3.7  --  3.7  --   CL 103 107  --  110  --   CO2 26 23  --  23  --   GLUCOSE 104* 197*  --  174*  --   BUN 22 17  --  17  --   CREATININE 0.87 0.73 0.87 0.82 0.83  CALCIUM 9.7 9.0  --  8.8*  --   MG 1.5* 1.5* 1.8  --   --   PHOS  --  2.9  --   --   --    GFR: Estimated Creatinine Clearance: 91 mL/min (by C-G formula based on SCr of 0.83 mg/dL). Liver Function Tests: Recent Labs  Lab 03/29/20 1616 03/30/20 0515  AST 22 19  ALT 22 19  ALKPHOS 79 75  BILITOT 0.7 0.7  PROT 7.6 6.8  ALBUMIN 3.3* 2.9*   No results for input(s): LIPASE, AMYLASE in the last 168 hours. No results for input(s): AMMONIA in the last 168 hours. Coagulation Profile: No results for input(s): INR, PROTIME in the last 168 hours. Cardiac Enzymes: No results for input(s): CKTOTAL, CKMB, CKMBINDEX, TROPONINI in the last 168 hours. BNP (last 3 results) No results for input(s): PROBNP in the last 8760 hours. HbA1C: No results for input(s): HGBA1C in the last 72 hours. CBG: Recent Labs  Lab 04/02/20 0007 04/02/20 0411 04/02/20 0811 04/02/20 1141 04/02/20 1504  GLUCAP 169* 155* 145* 157* 132*   Lipid Profile: No results for input(s): CHOL, HDL, LDLCALC, TRIG,  CHOLHDL, LDLDIRECT in the last 72 hours. Thyroid Function Tests: No results for input(s): TSH, T4TOTAL, FREET4, T3FREE, THYROIDAB in the last 72 hours. Anemia Panel: No results for input(s): VITAMINB12, FOLATE, FERRITIN, TIBC, IRON, RETICCTPCT in the last 72 hours. Sepsis Labs: No results for input(s): PROCALCITON, LATICACIDVEN in the last 168 hours.  Recent Results (from the past 240 hour(s))  SARS CORONAVIRUS 2 (TAT 6-24 HRS) Nasopharyngeal Nasopharyngeal Swab     Status: None   Collection Time: 03/29/20  7:41 PM   Specimen: Nasopharyngeal Swab  Result Value Ref Range Status   SARS Coronavirus 2 NEGATIVE NEGATIVE Final    Comment: (NOTE) SARS-CoV-2 target nucleic acids are NOT DETECTED.  The SARS-CoV-2 RNA is generally detectable in upper and lower respiratory specimens during the acute phase of infection. Negative results do not preclude SARS-CoV-2 infection, do  not rule out co-infections with other pathogens, and should not be used as the sole basis for treatment or other patient management decisions. Negative results must be combined with clinical observations, patient history, and epidemiological information. The expected result is Negative.  Fact Sheet for Patients: HairSlick.no  Fact Sheet for Healthcare Providers: quierodirigir.com  This test is not yet approved or cleared by the [place] FDA and  has been authorized for detection and/or diagnosis of SARS-CoV-2 by FDA under an Emergency Use Authorization (EUA). This EUA will remain  in effect (meaning this test can be used) for the duration of the COVID-19 declaration under Se ction 564(b)(1) of the Act, 21 U.S.C. section 360bbb-3(b)(1), unless the authorization is terminated or revoked sooner.  Performed at [facility], [address] [place], [place] [address]   Blood culture (routine x 2)     Status: None (Preliminary result)   Collection Time:  03/29/20  7:41 PM   Specimen: BLOOD  Result Value Ref Range Status   Specimen Description BLOOD LEFT ANTECUBITAL  Final   Special Requests   Final    BOTTLES DRAWN AEROBIC AND ANAEROBIC Blood Culture results may not be optimal due to an inadequate volume of blood received in culture bottles   Culture   Final    NO GROWTH 4 DAYS Performed at [facility], [address] [place], [place] [address]    Report Status PENDING  Incomplete  Blood culture (routine x 2)     Status: None (Preliminary result)   Collection Time: 03/29/20  7:41 PM   Specimen: BLOOD  Result Value Ref Range Status   Specimen Description BLOOD BLOOD RIGHT FOREARM  Final   Special Requests   Final    BOTTLES DRAWN AEROBIC AND ANAEROBIC Blood Culture adequate volume   Culture   Final    NO GROWTH 4 DAYS Performed at [facility], [address], [place], [place] [address]    Report Status PENDING  Incomplete  Aerobic Culture w Gram Stain (superficial specimen)     Status: None (Preliminary result)   Collection Time: 04/01/20  8:32 PM   Specimen: Wound  Result Value Ref Range Status   Specimen Description   Final    WOUND Performed at [facility], [address], [place], [place] [address]    Special Requests   Final    NONE Performed at [facility], [address] [place], [place] [address]    Gram Stain   Final    RARE WBC PRESENT, PREDOMINANTLY PMN RARE GRAM POSITIVE COCCI Performed at [facility], [address], [place], [place] [address]    Culture PENDING  Incomplete   Report Status PENDING  Incomplete  Surgical PCR screen     Status: None   Collection Time: 04/02/20  3:30 AM   Specimen: Nasal Mucosa; Nasal Swab  Result Value Ref Range Status   MRSA, PCR NEGATIVE NEGATIVE Final   Staphylococcus aureus NEGATIVE NEGATIVE Final    Comment: (NOTE) The Xpert SA Assay (FDA approved for NASAL specimens in patients 79 years of age and older), is one  component of a comprehensive surveillance program. It is not intended to diagnose infection nor to guide or monitor treatment. Performed at [facility], [address], [place], [place] [address]          Radiology Studies: PERIPHERAL VASCULAR CATHETERIZATION  Result Date: 03/31/2020 See op note       Scheduled  Meds:  [MAR Hold] amLODipine  5 mg Oral Daily   [MAR Hold] vitamin C  250 mg Oral BID   [MAR Hold] insulin aspart  0-9 Units Subcutaneous Q4H   [MAR Hold] insulin glargine  5 Units Subcutaneous QHS   [MAR Hold] multivitamin-lutein  1 capsule Oral Daily   [MAR Hold] mupirocin ointment  1 application Nasal BID   [MAR Hold] nutrition supplement (JUVEN)  1 packet Oral BID BM   [MAR Hold] pravastatin  40 mg Oral Daily   [MAR Hold] propranolol  40 mg Oral TID   [MAR Hold] Ensure Max Protein  11 oz Oral Daily   [MAR Hold] ramipril  10 mg Oral Daily   Continuous Infusions:  [MAR Hold] sodium chloride 10 mL/hr at 04/02/20 0914   [MAR Hold] ampicillin-sulbactam (UNASYN) IV 3 g (04/02/20 1257)   [MAR Hold] vancomycin 1,250 mg (04/02/20 1124)     LOS: 4 days    Time spent: 25 minutes    Tresa Moore, MD [facility] Pager [phone]  If 7PM-7AM, please contact night-coverage 04/02/2020, 3:22 PM

## 2020-04-02 NOTE — Op Note (Signed)
PODIATRY / FOOT AND ANKLE SURGERY OPERATIVE REPORT    SURGEON: Rosetta Posner, DPM  PRE-OPERATIVE DIAGNOSIS:  1.  Osteomyelitis right third and fourth metatarsals 2.  Charcot neuroarthropathy 3.  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  PVD 5.  Potential osteomyelitis cuboid and fifth metatarsal base right foot.  POST-OPERATIVE DIAGNOSIS: Same  PROCEDURE(S): 1. Right tendo Achilles lengthening 2. Right transmetatarsal amputation 3. Application of antibiotic beads 4. Right fifth metatarsal/cuboid bone biopsy and culture  HEMOSTASIS: Right high ankle tourniquet for a portion of the case  ANESTHESIA: general  ESTIMATED BLOOD LOSS: 50 cc  FINDING(S): 1.  Right fourth metatarsal osteomyelitis  PATHOLOGY/SPECIMEN(S): Right forefoot pathology specimen.  Bone culture fifth metatarsal base and bone biopsy  INDICATIONS:   Jason [name] is a 75 y.o. male who presents with a nonhealing ulceration to the lateral aspect of the right foot.  Patient has been seen by Dr Alberteen Spindle in the past for this issue and has been treated with local wound care but the ulcer continued to worsen.  Patient appeared to have changes on x-ray showing osteomyelitis and the wound did probe to bone.  Patient was admitted to the hospital for further work-up and evaluation.  Patient also has poor blood flow so revascularization procedure was performed by vascular prior to today's intervention.  Patient's MRI also showed osteomyelitis of the right third and fourth metatarsals as well as potentially the cuboid and fifth metatarsal base area.  Clinically patient's wound only probes to bone at the fourth metatarsal.  Believe that a lot of patient's changes seen on MRI are likely due to a previous Charcot changes.  Discussed all treatment options with the patient both conservative and surgical attempts at correction clean potential risks and complications and at this time patient is elected for procedure  consisting of right transmetatarsal amputation, tendo Achilles lengthening, and bone biopsy of fifth tarsometatarsal joint area..  DESCRIPTION: After obtaining full informed written consent, the patient was brought back to the operating room and placed supine upon the operating table.  The patient received IV antibiotics prior to induction.  After obtaining adequate anesthesia, a ankle block and Achilles tendon block were performed with 20 cc of half percent Marcaine plain.  The patient was prepped and draped in the standard fashion.  Attention was then directed to the posterior aspect of the right heel in the Achilles area where 3 percutaneous stab incisions were made along the midline of the Achilles approximately 3 to 4 cm apart.  The first and last incisions were made in such a way to split the tendon at the central aspect and spiked the blade medially and the middle incision was used to split the tendon and swept the blade laterally thereby completing the trifurcation.  Increase ankle joint dorsiflexion was noted to approximately 10 to 15 degrees after the release was performed indicating successful release.  The skin incisions were reapproximated well coapted with skin staples.  Attention was then directed to the right forefoot where a linear longitudinal incision was made in a fishmouth type of incision excising the ulceration to the fourth metatarsal head area.  This was done in such a way to ellipse the ulcerated area.  The incision was deepened straight down to bone for the entirety.  Dissection was then continued down to the metatarsal phalangeal joints and the metatarsal phalangeal joints 1 through 4 were disarticulated after releasing the extensor tendons, joint capsule, flexor tendons and plantar plates.  The digits were passed  off the operative site.  The fourth metatarsal head was notably distracted and had signs consistent with osteomyelitis present to the level of the midshaft portion as was  seen on the MRI.  At this time there appeared to be a large amount of bleeding that occurred despite attempts at hemostasis the patient still continued to bleed with electrocautery so at this time it was determined to use an Esmarch bandage to exsanguinate the right lower extremity and the pneumatic ankle tourniquet was inflated for the remaining portion of the case.  Circumferential dissection was then performed around the first, second, third, fourth metatarsals to the midshaft levels.  At this time the sagittal bone saw was used to resect the metatarsals 1 through 4 at the midshaft to almost base level.  The metatarsal bones were then dissected free and passed off the operative site and proximal margins were marked in purple ink.  The fourth metatarsal appeared to be hard at the level of the cut.  The third metatarsal also appeared to be hard at the level of the calf.  The fifth metatarsal base was also identified during this process and did not appear to be exposed to the open ulceration area.  The fifth metatarsal base also appeared to be hard and had no evidence of osteomyelitis clinically.  The plantar plates as well as sesamoids were resected and passed off the operative site as well as extensor and flexor tendons.  The flap was then remodeled slightly for adequate closure.  The surgical site was flushed with copious amounts normal sterile saline.  Vancomycin infused antibiotic beads were then placed into the area of the operative site scattered throughout the metatarsals that remained especially the lateral metatarsals.  Deep closure was then obtained after electrocauterization was performed of any vessels or lumens identified with 3-0 Vicryl.  Subcutaneous closure was obtained with 3-0 Vicryl.  The skin was then reapproximated well coapted with combination of simple, horizontal mattress, and vertical mattress type stitching 3-0 nylon and skin staples.  The pneumatic ankle tourniquet was deflated during  closure and a prompt hyperemic response was noted to the dorsal and plantar flaps of the foot.  Hemostasis appeared to be well achieved overall.  The postoperative dressing was then applied consisting of Xeroform to the incision lines followed by 4 x 4 gauze, ABD, Kerlix, Ace wrap, tall cam boot.  Patient tolerated the procedure and anesthesia well was transferred to the recovery room vital signs stable vascular status appearing to remain intact to the right foot.  Patient will be discharged back to the inpatient room with the appropriate orders and medications.  Patient is to be nonweightbearing on the right lower extremity all times.  PT has been ordered can start tomorrow for evaluation.  If bone cultures/path specimen does come back positive patient will likely need 6 weeks of IV antibiotics.  We will continue to follow-up until discharge.  COMPLICATIONS: None  CONDITION: Good, stable  Rosetta Posner, DPM

## 2020-04-02 NOTE — H&P (Signed)
HISTORY AND PHYSICAL INTERVAL NOTE:  04/02/2020  4:10 PM  Jason [name]  has presented today for surgery, with the diagnosis of Right Foot Wound, OSTEOMYELITIS 3RD AND 4TH RAYS AND POSSIBLE CUBOID/5TH MET BASE.  The various methods of treatment have been discussed with the patient.  No guarantees were given.  After consideration of risks, benefits and other options for treatment, the patient has consented to surgery.  I have reviewed the patients' chart and labs.    PROCEDURE: RIGHT TRANSMETATARSAL AMPUTATION, ACHILLES TENDON LENGTHENING  A history and physical examination was performed in the hospital.  The patient was reexamined.  There have been no changes to this history and physical examination.  Caroline More, DPM

## 2020-04-02 NOTE — Anesthesia Postprocedure Evaluation (Signed)
Anesthesia Post Note  Patient: Jason [name]  Procedure(s) Performed: TRANSMETATARSAL AMPUTATION (Right Toe) ACHILLES LENGTHENING (Right )  Patient location during evaluation: PACU Anesthesia Type: General Level of consciousness: awake and alert Pain management: pain level controlled Vital Signs Assessment: post-procedure vital signs reviewed and stable Respiratory status: spontaneous breathing, nonlabored ventilation, respiratory function stable and patient connected to nasal cannula oxygen Cardiovascular status: blood pressure returned to baseline and stable Postop Assessment: no apparent nausea or vomiting Anesthetic complications: no   No complications documented.   Last Vitals:  Vitals:   04/02/20 1840 04/02/20 1900  BP: (!) 156/65 (!) 173/75  Pulse: (!) 54 (!) 55  Resp: 14 16  Temp: (!) 36.1 C 36.5 C  SpO2: 95% 97%    Last Pain:  Vitals:   04/02/20 1900  TempSrc: Oral  PainSc: 2                  Lenard Simmer

## 2020-04-02 NOTE — Progress Notes (Signed)
Pharmacy Antibiotic Note  Jason [name] is a 75 y.o. male with medical history including diabetes and PAD admitted on 03/29/2020 with R foot infection complicated by osteomyelitis.  Pharmacy has been consulted for vancomycin  dosing.  Today, 04/02/20  --Day # 5 vancomycin --Cefepime changed to Unasyn yesterday by ID --Right transmetatarsal amputation today  Plan:  Vancomycin 1.25 g IV q12h --Calculated AUC: 505, Cmin 14.8 --Daily Scr per protocol; stable --Vancomycin peak ordered for 3/24 at 0300 --Vancomycin trough ordered for 3/24 at 1000   Height: 5\' 10"  (177.8 cm) Weight: 96.4 kg (212 lb 8.4 oz) IBW/kg (Calculated) : 73  Temp (24hrs), Avg:97.8 F (36.6 C), Min:96.8 F (36 C), Max:98.3 F (36.8 C)  Recent Labs  Lab 03/29/20 1616 03/30/20 0515 03/31/20 0724 04/01/20 0538 04/02/20 0547  WBC 6.2 4.9  --   --   --   CREATININE 0.87 0.73 0.87 0.82 0.83    Estimated Creatinine Clearance: 91 mL/min (by C-G formula based on SCr of 0.83 mg/dL).    No Known Allergies  Antimicrobials this admission: Vancomycin 3/19 >> Cefepime 3/19 >> 3/22 Unasyn 3/23 >>  (cipro and augmentin prior to admission)  Microbiology results: 3/23 MRSA PCR: (-) 3/22 Wound Cx: GPC, pending 3/19 BCx NGTD   4/19 04/02/2020

## 2020-04-02 NOTE — Progress Notes (Signed)
Nutrition Follow-up  DOCUMENTATION CODES:   Obesity unspecified  INTERVENTION:   -MVI with minerals daily -Decrease Ensure Max po daily, each supplement provides 150 kcal and 30 grams of protein.  -1 packet Juven BID, each packet provides 95 calories, 2.5 grams of protein (collagen), and 9.8 grams of carbohydrate (3 grams sugar); also contains 7 grams of L-arginine and L-glutamine, 300 mg vitamin C, 15 mg vitamin E, 1.2 mcg vitamin B-12, 9.5 mg zinc, 200 mg calcium, and 1.5 g  Calcium Beta-hydroxy-Beta-methylbutyrate to support wound healing  NUTRITION DIAGNOSIS:   Increased nutrient needs related to wound healing as evidenced by estimated needs.  Ongoing  GOAL:   Patient will meet greater than or equal to 90% of their needs  Progressing   MONITOR:   PO intake,Supplement acceptance,Labs,Weight trends,Skin,I & O's  REASON FOR ASSESSMENT:   Consult Assessment of nutrition requirement/status  ASSESSMENT:   75 y/o male with h/o DM, CAD, HTN, and HLD who is admitted with osteomyelitis secondary to diabetic foot wound  3/22- s/p revascularization of rt lower extremity  Reviewed I/O's: -445 ml x 24 hours and +838 ml since admission  UOP: 925 ml x 24 hours  Per podiatry notes, plan for TMA and bone biopsy today.   Spoke with pt at bedside, who was pleasant and in good spirits today. He reports that he has a good appetite; noted meal completion 90-100%. PTA he was eating well and would consume 3 meals and an HS snack daily (Breakfast: eggs and toast; Lunch: sandwich and soup; Dinner: meat, starch, and vegetable). Pt shares he usually drinks diet soda.   Pt denies any weight loss. His UBW is around 185#.   Pt is optimistic about his procedure today. Discussed importance of good meal and supplement intake to promote wound and post-operative healing.   Medications reviewed and include vitamin C.   Labs reviewed: CBGS: 145-157 (inpatient orders for glycemic control are 0-9  units insulin aspart every 4 hours and 5 units insulin glargine daily).   NUTRITION - FOCUSED PHYSICAL EXAM:  Flowsheet Row Most Recent Value  Orbital Region No depletion  Upper Arm Region No depletion  Thoracic and Lumbar Region No depletion  Buccal Region No depletion  Temple Region No depletion  Clavicle Bone Region No depletion  Clavicle and Acromion Bone Region No depletion  Scapular Bone Region No depletion  Dorsal Hand No depletion  Patellar Region No depletion  Anterior Thigh Region No depletion  Posterior Calf Region No depletion  Edema (RD Assessment) Mild  Hair Reviewed  Eyes Reviewed  Mouth Reviewed  Skin Reviewed  Nails Reviewed       Diet Order:   Diet Order            Diet NPO time specified  Diet effective ____                 EDUCATION NEEDS:   Education needs have been addressed  Skin:  Skin Assessment: Skin Integrity Issues: Skin Integrity Issues:: Diabetic Ulcer Diabetic Ulcer: rt foot Incisions: -  Last BM:  04/01/20  Height:   Ht Readings from Last 1 Encounters:  03/31/20 5\' 10"  (1.778 m)    Weight:   Wt Readings from Last 1 Encounters:  03/31/20 96.4 kg    Ideal Body Weight:  75.4 kg  BMI:  Body mass index is 30.49 kg/m.  Estimated Nutritional Needs:   Kcal:  2100-2400kcal/day  Protein:  105-120g/day  Fluid:  1.9-2.2L/day    04/02/20, RD,  LDN, [place] Registered Dietitian II Certified Diabetes Care and Education Specialist Please refer to [facility] for RD and/or RD on-call/weekend/after hours pager

## 2020-04-03 ENCOUNTER — Encounter: Payer: Self-pay | Admitting: Podiatry

## 2020-04-03 DIAGNOSIS — E11628 Type 2 diabetes mellitus with other skin complications: Secondary | ICD-10-CM | POA: Diagnosis not present

## 2020-04-03 DIAGNOSIS — L089 Local infection of the skin and subcutaneous tissue, unspecified: Secondary | ICD-10-CM | POA: Diagnosis not present

## 2020-04-03 DIAGNOSIS — M86072 Acute hematogenous osteomyelitis, left ankle and foot: Secondary | ICD-10-CM | POA: Diagnosis not present

## 2020-04-03 LAB — CULTURE, BLOOD (ROUTINE X 2)
Culture: NO GROWTH
Culture: NO GROWTH
Special Requests: ADEQUATE

## 2020-04-03 LAB — CBC WITH DIFFERENTIAL/PLATELET
Abs Immature Granulocytes: 0.06 10*3/uL (ref 0.00–0.07)
Basophils Absolute: 0 10*3/uL (ref 0.0–0.1)
Basophils Relative: 0 %
Eosinophils Absolute: 0.3 10*3/uL (ref 0.0–0.5)
Eosinophils Relative: 3 %
HCT: 33.7 % — ABNORMAL LOW (ref 39.0–52.0)
Hemoglobin: 11.1 g/dL — ABNORMAL LOW (ref 13.0–17.0)
Immature Granulocytes: 1 %
Lymphocytes Relative: 14 %
Lymphs Abs: 1.1 10*3/uL (ref 0.7–4.0)
MCH: 28.5 pg (ref 26.0–34.0)
MCHC: 32.9 g/dL (ref 30.0–36.0)
MCV: 86.4 fL (ref 80.0–100.0)
Monocytes Absolute: 0.8 10*3/uL (ref 0.1–1.0)
Monocytes Relative: 10 %
Neutro Abs: 5.9 10*3/uL (ref 1.7–7.7)
Neutrophils Relative %: 72 %
Platelets: 236 10*3/uL (ref 150–400)
RBC: 3.9 MIL/uL — ABNORMAL LOW (ref 4.22–5.81)
RDW: 12.1 % (ref 11.5–15.5)
WBC: 8.2 10*3/uL (ref 4.0–10.5)
nRBC: 0 % (ref 0.0–0.2)

## 2020-04-03 LAB — GLUCOSE, CAPILLARY
Glucose-Capillary: 151 mg/dL — ABNORMAL HIGH (ref 70–99)
Glucose-Capillary: 152 mg/dL — ABNORMAL HIGH (ref 70–99)
Glucose-Capillary: 225 mg/dL — ABNORMAL HIGH (ref 70–99)
Glucose-Capillary: 264 mg/dL — ABNORMAL HIGH (ref 70–99)
Glucose-Capillary: 287 mg/dL — ABNORMAL HIGH (ref 70–99)
Glucose-Capillary: 94 mg/dL (ref 70–99)

## 2020-04-03 LAB — BASIC METABOLIC PANEL
Anion gap: 5 (ref 5–15)
BUN: 15 mg/dL (ref 8–23)
CO2: 24 mmol/L (ref 22–32)
Calcium: 8.7 mg/dL — ABNORMAL LOW (ref 8.9–10.3)
Chloride: 109 mmol/L (ref 98–111)
Creatinine, Ser: 0.84 mg/dL (ref 0.61–1.24)
GFR, Estimated: >60 mL/min (ref >60)
Glucose, Bld: 195 mg/dL — ABNORMAL HIGH (ref 70–99)
Potassium: 3.5 mmol/L (ref 3.5–5.1)
Sodium: 138 mmol/L (ref 135–145)

## 2020-04-03 LAB — MAGNESIUM: Magnesium: 1.7 mg/dL (ref 1.7–2.4)

## 2020-04-03 LAB — VANCOMYCIN, TROUGH: Vancomycin Tr: 20 ug/mL (ref 15–20)

## 2020-04-03 LAB — VANCOMYCIN, PEAK: Vancomycin Pk: 33 ug/mL (ref 30–40)

## 2020-04-03 MED ORDER — CLOPIDOGREL BISULFATE 75 MG PO TABS
75.0000 mg | ORAL_TABLET | Freq: Every day | ORAL | Status: DC
  Administered 2020-04-03 – 2020-04-04 (×2): 75 mg via ORAL
  Filled 2020-04-03 (×2): qty 1

## 2020-04-03 MED ORDER — VANCOMYCIN HCL 2000 MG/400ML IV SOLN
2000.0000 mg | INTRAVENOUS | Status: DC
  Filled 2020-04-03: qty 400

## 2020-04-03 MED ORDER — GABAPENTIN 300 MG PO CAPS
300.0000 mg | ORAL_CAPSULE | Freq: Three times a day (TID) | ORAL | Status: DC
  Administered 2020-04-03 – 2020-04-05 (×7): 300 mg via ORAL
  Filled 2020-04-03 (×7): qty 1

## 2020-04-03 MED ORDER — ASPIRIN EC 81 MG PO TBEC
81.0000 mg | DELAYED_RELEASE_TABLET | Freq: Every day | ORAL | Status: DC
  Administered 2020-04-03 – 2020-04-04 (×2): 81 mg via ORAL
  Filled 2020-04-03 (×2): qty 1

## 2020-04-03 NOTE — Progress Notes (Addendum)
ORTHOPAEDIC progress note  REQUESTING PHYSICIAN: Tresa Moore, MD  Chief Complaint: Right foot ulcer  HPI: Jason [name] is a 75 y.o. male who presents s/p 1 day R TMA with TAL.  Patient resting in bed comfortably at this time.  Has minimal complaints of pain and has stayed off foot and has not put any weight on the foot since the procedure.  Past Medical History:  Diagnosis Date  . CAD (coronary artery disease)   . Colon polyp   . Diabetes mellitus without complication (HCC)   . Hyperlipemia   . Hypertension   . Orchitis   . PVD (peripheral vascular disease) (HCC)   . Tremor    Past Surgical History:  Procedure Laterality Date  . ACHILLES TENDON SURGERY Right 04/02/2020   Procedure: ACHILLES LENGTHENING;  Surgeon: Rosetta Posner, DPM;  Location: [facility] ORS;  Service: Podiatry;  Laterality: Right;  . AMPUTATION Right 08/21/2016   Procedure: RAY RESECTION-RIGHT 5TH RAY;  Surgeon: Linus Galas, DPM;  Location: [facility] ORS;  Service: Podiatry;  Laterality: Right;  . AMPUTATION TOE Left    big  . CAROTID ENDARTERECTOMY Right   . COLON SURGERY    . CORONARY ARTERY BYPASS GRAFT    . LOWER EXTREMITY ANGIOGRAPHY Right 03/31/2020   Procedure: Lower Extremity Angiography;  Surgeon: Annice Needy, MD;  Location: [facility] INVASIVE CV [facility];  Service: Cardiovascular;  Laterality: Right;  . ROTATOR CUFF REPAIR Right   . 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 Marital status: Married    Spouse name: Not on file  . Number of children: Not on file  . Years of education: Not on file  . Highest education level: Not on file  Occupational History  . Occupation: IT sales professional  Tobacco Use  . Smoking status: Never Smoker  . Smokeless tobacco: Never Used  Substance and Sexual Activity  . Alcohol use: No  . Drug use:  No  . Sexual activity: Not on file  Other Topics Concern  . Not on file  Social History Narrative  . Not on file   Social Determinants of Health   Financial Resource Strain: Not on file  Food Insecurity: Not on file  Transportation Needs: Not on file  Physical Activity: Not on file  Stress: Not on file  Social Connections: Not on file   Family History  Problem Relation Age of Onset  . Diabetes Mother   . Diabetes Father    No Known Allergies Prior to Admission medications   Medication Sig Start Date End Date Taking? Authorizing Provider  aspirin 81 MG tablet Take 81 mg by mouth daily.    [provider]  clopidogrel (PLAVIX) 75 MG tablet Take 75 mg by mouth daily.    [provider]  docusate sodium (COLACE) 100 MG capsule Take 1 capsule (100 mg total) by mouth 2 (two) times daily as needed for mild constipation. 08/24/16   Altamese Dilling, MD  glimepiride (AMARYL) 4 MG tablet Take 4 mg by mouth 2 (two) times daily.     [provider]  insulin glargine (LANTUS) 100 UNIT/ML injection Inject 9 Units into the skin at bedtime.     [provider]  insulin regular (NOVOLIN R,HUMULIN R) 100 units/mL injection Inject 5-10 Units into the skin 3 (three) times daily before meals.  [provider]  Magnesium 250 MG TABS Take by mouth.    [provider]  metFORMIN (GLUCOPHAGE) 1000 MG tablet Take 1,000 mg by mouth 2 (two) times daily with a meal.    [provider]  Multiple Vitamin (MULTIVITAMIN) capsule Take 1 capsule by mouth daily.    [provider]  oxyCODONE-acetaminophen (PERCOCET/ROXICET) 5-325 MG tablet Take 1 tablet by mouth every 6 (six) hours as needed for severe pain. 08/24/16   Altamese Dilling, MD  povidone-iodine (BETADINE) 10 % external solution To apply and clean the wound 3 times a week. 08/24/16   Altamese Dilling, MD  pravastatin (PRAVACHOL) 40 MG tablet Take 40 mg by mouth daily.     [provider]  propranolol (INDERAL) 40 MG tablet Take 1 tablet (40 mg total) by mouth 3 (three) times daily. 08/24/16   Altamese Dilling, MD  ramipril (ALTACE) 10 MG capsule Take 10 mg by mouth daily.    [provider]   DG Foot Complete Right  Result Date: 04/02/2020 CLINICAL DATA:  Transmetatarsal amputation EXAM: RIGHT FOOT COMPLETE - 3+ VIEW COMPARISON:  03/29/2020 FINDINGS: Frontal, oblique, lateral views of the right foot are obtained. Postsurgical changes from transmetatarsal amputation through the base of the first through fifth metatarsals. Prior bony fusion within the lateral aspect of the midfoot. Likely antibiotic impregnated beads within the surgical bed. Diffuse soft tissue edema. IMPRESSION: 1. Postsurgical changes from transmetatarsal amputation right foot. Electronically Signed   By: Sharlet Salina M.D.   On: 04/02/2020 19:02    Positive ROS: All other systems have been reviewed and were otherwise negative with the exception of those mentioned in the HPI and as above.  12 point ROS was performed.  Physical Exam: General: Alert and oriented.  No apparent distress.  Vascular:  Left foot:Dorsalis Pedis:  absent Posterior Tibial:  absent  Right foot: Dorsalis Pedis:  absent Posterior Tibial:  absent  Neuro: Absent protective gross sensation  Derm: Right foot incisions to TMA and TAL sites appear well coapted with sutures and staples intact, no dehisence, CFT intact to dorsal and plantar flaps with mild swelling and minimal erythema noted.     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 -Incisions appear to be well coapted with sutures and staples intact with viable dorsal and plantar skin flaps with CFT intact.  No SOI present  today -Redressed with DSD.  Keep CDI for next week.  Will change in clinic next week. -Continue NWB at all times to the RLE. -Appreciate vascular recs. -Appreciate med and ID recs for Abx therapy.  Will await path and bone culture to determine IV vs oral Abx.  Clinically remaining bone did not appear infected in foot.  So far NGTD.  WBC 8.2, AFVSS.  GPC wound culture. -PT ordered.  Appreciate recs on placement vs home care.  Podiatry team to follow more peripherally at this time.  Rosetta Posner, DPM  04/03/2020 12:00 PM

## 2020-04-03 NOTE — Progress Notes (Signed)
Date of Admission:  03/29/2020     ID: Jason [name] is a 75 y.o. male  Active Problems:   Cellulitis in diabetic foot (HCC)   Cellulitis   History of CVA (cerebrovascular accident)   CAD (coronary artery disease)   Essential hypertension   Type 2 diabetes mellitus with hyperlipidemia (HCC)   Pyogenic inflammation of bone (HCC)   Diabetic ulcer of right midfoot associated with type 2 diabetes mellitus, with muscle involvement without evidence of necrosis (HCC)   PVD (peripheral vascular disease) (HCC)    Subjective: Underwent right TMA yesterday Doing well No fever or chills Sitting in chair  Medications:  . amLODipine  5 mg Oral Daily  . vitamin C  250 mg Oral BID  . aspirin EC  81 mg Oral Daily  . clopidogrel  75 mg Oral Daily  . gabapentin  300 mg Oral TID  . insulin aspart  0-9 Units Subcutaneous Q4H  . insulin glargine  5 Units Subcutaneous QHS  . multivitamin-lutein  1 capsule Oral Daily  . mupirocin ointment  1 application Nasal BID  . nutrition supplement (JUVEN)  1 packet Oral BID BM  . pravastatin  40 mg Oral Daily  . propranolol  40 mg Oral TID  . Ensure Max Protein  11 oz Oral Daily  . ramipril  10 mg Oral Daily    Objective: Vital signs in last 24 hours: Temp:  [97 F (36.1 C)-98.7 F (37.1 C)] 98.7 F (37.1 C) (03/24 1205) Pulse Rate:  [53-62] 62 (03/24 1545) Resp:  [11-20] 20 (03/24 1545) BP: (116-173)/(56-75) 134/73 (03/24 1545) SpO2:  [95 %-100 %] 97 % (03/24 1545)  PHYSICAL EXAM:  General: Alert, cooperative, no distress,  Lungs: Clear to auscultation bilaterally. No Wheezing or Rhonchi. No rales. Heart: Regular rate and rhythm, no murmur, rub or gallop. Abdomen: Soft, non-tender,not distended. Bowel sounds normal. No masses Extremities:   TMA site looks clean well coapted sutures with no dehiscence or discharge or erythema Neurologic: Grossly non-focal  Lab Results Recent Labs    04/01/20 0538 04/02/20 0547 04/03/20 0128  WBC   --   --  8.2  HGB  --   --  11.1*  HCT  --   --  33.7*  NA 138  --  138  K 3.7  --  3.5  CL 110  --  109  CO2 23  --  24  BUN 17  --  15  CREATININE 0.82 0.83 0.84   Liver Panel No results for input(s): PROT, ALBUMIN, AST, ALT, ALKPHOS, BILITOT, BILIDIR, IBILI in the last 72 hours. Sedimentation Rate No results for input(s): ESRSEDRATE in the last 72 hours. C-Reactive Protein No results for input(s): CRP in the last 72 hours.  Microbiology:  Studies/Results: DG Foot Complete Right  Result Date: 04/02/2020 CLINICAL DATA:  Transmetatarsal amputation EXAM: RIGHT FOOT COMPLETE - 3+ VIEW COMPARISON:  03/29/2020 FINDINGS: Frontal, oblique, lateral views of the right foot are obtained. Postsurgical changes from transmetatarsal amputation through the base of the first through fifth metatarsals. Prior bony fusion within the lateral aspect of the midfoot. Likely antibiotic impregnated beads within the surgical bed. Diffuse soft tissue edema. IMPRESSION: 1. Postsurgical changes from transmetatarsal amputation right foot. Electronically Signed   By: Sharlet Salina M.D.   On: 04/02/2020 19:02     Assessment/Plan: Right foot infection with underlying diabetes mellitus and peripheral arterial disease.  Underwent TMA.  Site looks healthy.  Looks like this is a curative  surgery. Cultures from the surgical procedure no growth so far Wound culture that was done which was superficial has staph aureus.  Susceptibility pending.  Pathology pending very likely patient will be sent on p.o. antibiotics.  Diabetes mellitus on insulin  Hypertension on ramipril and amlodipine  CAD on pravastatin and propranolol  Discussed the management with the patient and care team.

## 2020-04-03 NOTE — Evaluation (Signed)
Occupational Therapy Evaluation Patient Details Name: Jason [name] MRN: [id number] DOB: 1945-08-12 Today's Date: 04/03/2020    History of Present Illness 75 y/o s/p transmetatarsal amputation with bone biopsy and culture on 3/23.   Clinical Impression   Patient presenting with decreased I in self care, balance, functional mobility/transfers, endurance, and safety awareness. Patient reports living with family PTA. Family members work during the day and pt also works part time as Holiday representative at [employer]. OT reviewed new precautions of NWB related to functional transfers and self care. Pt with CAM boot donned and sits EOB to don L shoe without assistance. Pt standing with min A while maintaining precautions. Pt ambulating forward 6' and back to recliner chair and giving great effort this session. Pt seated in recliner chair with chair alarm activated and R LE elevated.Patient will benefit from acute OT to increase overall independence in the eas of ADLs, functional mobility, and safety awareness in order to safely discharge home with family.    Follow Up Recommendations  Home health OT;Supervision - Intermittent    Equipment Recommendations  3 in 1 bedside commode       Precautions / Restrictions Precautions Precautions: Fall Restrictions Weight Bearing Restrictions: Yes RLE Weight Bearing: Non weight bearing Other Position/Activity Restrictions: CAM BOOT donned      Mobility Bed Mobility Overal bed mobility: Independent             General bed mobility comments: no physical assistance needed    Transfers Overall transfer level: Needs assistance Equipment used: Rolling walker (2 wheeled) Transfers: Sit to/from [employer] Sit to Stand: Min assist Stand pivot transfers: Min guard       General transfer comment: cuing for technique while maintaining NWB    Balance Overall balance assessment: Needs assistance Sitting-balance support: No upper extremity  supported;Feet supported Sitting balance-Leahy Scale: Good     Standing balance support: Bilateral upper extremity supported Standing balance-Leahy Scale: Fair                             ADL either performed or assessed with clinical judgement   ADL Overall ADL's : Needs assistance/impaired     Grooming: Wash/dry hands;Wash/dry face;Sitting;Set up;Supervision/safety               Lower Body Dressing: Set up Lower Body Dressing Details (indicate cue type and reason): Pt donned L diabetic lace up shoe while seated EOB without assistance Toilet Transfer: Minimal assistance;RW Toilet Transfer Details (indicate cue type and reason): simulated         Functional mobility during ADLs: Min guard;Rolling walker       Vision Patient Visual Report: No change from baseline              Pertinent Vitals/Pain Pain Assessment: 0-10 Pain Score: 4  Pain Location: R foot Pain Descriptors / Indicators: Aching;Discomfort Pain Intervention(s): Limited activity within patient's tolerance;Monitored during session;Repositioned     Hand Dominance Right   Extremity/Trunk Assessment Upper Extremity Assessment Upper Extremity Assessment: Overall WFL for tasks assessed   Lower Extremity Assessment Lower Extremity Assessment: Defer to PT evaluation       Communication Communication Communication: No difficulties   Cognition Arousal/Alertness: Awake/alert Behavior During Therapy: WFL for tasks assessed/performed Overall Cognitive Status: Within Functional Limits for tasks assessed  General Comments: very pleasant and cooperative              Home Living Family/patient expects to be discharged to:: Private residence Living Arrangements: Children Available Help at Discharge: Family;Available PRN/intermittently Type of Home: House       Home Layout: Multi-level;Able to live on main level with bedroom/bathroom                Home Equipment: Walker - 2 wheels   Additional Comments: Pt plans on sink bathing at home      Prior Functioning/Environment Level of Independence: Independent        Comments: Indep with ADLs, household and community mobilization; working part-time as Holiday representative at [employer]: Decreased strength;Impaired balance (sitting and/or standing);Decreased knowledge of precautions;Pain;Decreased safety awareness;Decreased knowledge of use of DME or AE;Decreased activity tolerance      OT Treatment/Interventions: Self-care/ADL training;DME and/or AE instruction;Therapeutic activities;Balance training;Therapeutic exercise;Manual therapy;Energy conservation;Patient/family education;Modalities    OT Goals(Current goals can be found in the care plan section) Acute Rehab OT Goals Patient Stated Goal: to go home OT Goal Formulation: With patient Time For Goal Achievement: 04/17/20 Potential to Achieve Goals: Fair ADL Goals Pt Will Perform Lower Body Dressing: (P) with modified independence Pt Will Transfer to Toilet: (P) with modified independence Pt Will Perform Toileting - Clothing Manipulation and hygiene: (P) with modified independence Pt/caregiver will Perform Home Exercise Program: (P) Increased strength;Both right and left upper extremity;With theraband;With written HEP provided;Independently  OT Frequency: Min 2X/week   Barriers to D/C:    none known at this time          AM-PAC OT "6 Clicks" Daily Activity     Outcome Measure Help from another person eating meals?: None Help from another person taking care of personal grooming?: None Help from another person toileting, which includes using toliet, bedpan, or urinal?: A Little Help from another person bathing (including washing, rinsing, drying)?: A Little Help from another person to put on and taking off regular upper body clothing?: None Help from another person to put on and taking off  regular lower body clothing?: A Little 6 Click Score: 21   End of Session Equipment Utilized During Treatment: Rolling walker Nurse Communication: Mobility status  Activity Tolerance: Patient tolerated treatment well Patient left: in chair;with call bell/phone within reach;with chair alarm set  OT Visit Diagnosis: Unsteadiness on feet (R26.81);Muscle weakness (generalized) (M62.81);Pain Pain - part of body:  (foot)                Time: 0930-1000 OT Time Calculation (min): 30 min Charges:  OT General Charges $OT Visit: 1 Visit OT Treatments $Self Care/Home Management : 8-22 mins $Therapeutic Activity: 8-22 mins  Jackquline Denmark, MS, OTR/L , CBIS ascom [phone]  04/03/20, 12:36 PM

## 2020-04-03 NOTE — Progress Notes (Signed)
PT Cancellation Note  Patient Details Name: Jason [name] MRN: [id number] DOB: May 15, 1945   Cancelled Treatment:     PT orders received and chart reviewed.  Attempted to see pt, but he was in the middle of eating lunch and asked to be seen at later time.  Will re-attempt to see pt for evaluation at medically appropriate.    Nolon Bussing, PT, DPT 04/03/20, 11:36 AM

## 2020-04-03 NOTE — Evaluation (Signed)
Physical Therapy Evaluation Patient Details Name: Jason [name] MRN: [id number] DOB: 10/10/1945 Today's Date: 04/03/2020   History of Present Illness  74 y/o s/p transmetatarsal amputation with bone biopsy and culture on 3/23.  Clinical Impression  Pt received in recliner upon arrival to room.  Pt agreeable to participate in therapy.  Pt able to perform transfers from recliner to standing with CGA and use of FWW.  Pt performs well with current NWB status on the R LE, pushing body weight through UEs on FWW for support and ambulation.  Pt able to walk to doorway exiting room, then to bathroom with CGA.  Pt has some slight difficulty with navigation of the restroom which required verbal cuing to assist in performing correctly.  Pt has good carryover and is able to manage to perform ADL's with supervision.  Pt then transferred back to recliner in which he was left with all needs met and call bell placed within reach.  Due to weight bearing status, recommendations are to be update to home with HHPT.  Pt will benefit from skilled PT intervention to increase independence and safety with basic mobility in preparation for discharge to the venue listed below.       Follow Up Recommendations Home health PT;Supervision for mobility/OOB    Equipment Recommendations  Rolling walker with 5" wheels    Recommendations for Other Services       Precautions / Restrictions Precautions Precautions: Fall Restrictions Weight Bearing Restrictions: Yes RLE Weight Bearing: Non weight bearing Other Position/Activity Restrictions: CAM BOOT donned      Mobility  Bed Mobility Overal bed mobility: Independent             General bed mobility comments: no physical assistance needed    Transfers Overall transfer level: Needs assistance Equipment used: Rolling walker (2 wheeled) Transfers: Sit to/from Stand;Stand Pivot Transfers Sit to Stand: Min assist Stand pivot transfers: Min guard       General  transfer comment: cuing for technique while maintaining NWB status  Ambulation/Gait Ambulation/Gait assistance: Modified independent (Device/Increase time) Gait Distance (Feet): 40 Feet Assistive device: Rolling walker (2 wheeled)       General Gait Details: 3-point, step to gait pattern leading with R LE; min cuing to decreased L step length to maximize heel WBing R LE.  Good gait mechanics, safety awareness and overall control with gait efforts.  Stairs            Wheelchair Mobility    Modified Rankin (Stroke Patients Only)       Balance Overall balance assessment: Needs assistance Sitting-balance support: No upper extremity supported;Feet supported Sitting balance-Leahy Scale: Good     Standing balance support: Bilateral upper extremity supported Standing balance-Leahy Scale: Fair                               Pertinent Vitals/Pain Pain Assessment: 0-10 Pain Score: 4  Pain Location: R foot Pain Descriptors / Indicators: Aching;Discomfort Pain Intervention(s): Limited activity within patient's tolerance;Monitored during session;Repositioned    Home Living Family/patient expects to be discharged to:: Private residence Living Arrangements: Children Available Help at Discharge: Family;Available PRN/intermittently Type of Home: House       Home Layout: Multi-level;Able to live on main level with bedroom/bathroom Home Equipment: Walker - 2 wheels Additional Comments: Pt plans on sink bathing at home    Prior Function Level of Independence: Independent           Comments: Indep with ADLs, household and community mobilization; working part-time as Tourist information centre manager at [employer]: Right    Extremity/Trunk Assessment   Upper Extremity Assessment Upper Extremity Assessment: Overall WFL for tasks assessed    Lower Extremity Assessment Lower Extremity Assessment: Overall WFL for tasks assessed       Communication    Communication: No difficulties  Cognition Arousal/Alertness: Awake/alert Behavior During Therapy: WFL for tasks assessed/performed Overall Cognitive Status: Within Functional Limits for tasks assessed                                 General Comments: very pleasant and cooperative      General Comments      Exercises     Assessment/Plan    PT Assessment Patient needs continued PT services  PT Problem List Decreased strength;Decreased activity tolerance;Decreased balance;Decreased mobility;Decreased knowledge of use of DME;Decreased safety awareness       PT Treatment Interventions      PT Goals (Current goals can be found in the Care Plan section)  Acute Rehab PT Goals Patient Stated Goal: to go home PT Goal Formulation: With patient Time For Goal Achievement: 04/17/20 Potential to Achieve Goals: Good    Frequency     Barriers to discharge Decreased caregiver support Family members working during the day.    Co-evaluation               AM-PAC PT "6 Clicks" Mobility  Outcome Measure Help needed turning from your back to your side while in a flat bed without using bedrails?: None Help needed moving from lying on your back to sitting on the side of a flat bed without using bedrails?: None Help needed moving to and from a bed to a chair (including a wheelchair)?: A Little Help needed standing up from a chair using your arms (e.g., wheelchair or bedside chair)?: A Little Help needed to walk in hospital room?: A Little Help needed climbing 3-5 steps with a railing? : A Lot 6 Click Score: 19    End of Session Equipment Utilized During Treatment: Gait belt Activity Tolerance: Patient tolerated treatment well Patient left: in bed;with call bell/phone within reach Nurse Communication: Mobility status PT Visit Diagnosis: Difficulty in walking, not elsewhere classified (R26.2)    Time: 1314-3888 PT Time Calculation (min) (ACUTE ONLY): 40  min   Charges:   PT Evaluation $PT Eval Low Complexity: 1 Low PT Treatments $Gait Training: 23-37 mins $Self Care/Home Management: 8-22        Gwenlyn Saran, PT, DPT 04/03/20, 4:42 PM

## 2020-04-03 NOTE — Progress Notes (Signed)
PROGRESS NOTE    Jason [name]  DVV:[id number] DOB: Jun 29, 1945 DOA: 03/29/2020 PCP: Marina Goodell, MD  Brief Narrative: (Start on day 1 of progress note - keep it brief and live) 75 y.o. male with medical history significant of CAD, DM, HTN, HDL, PVD,  tremor, CVA occlusion of basilar artery   Presented with acute on chronic wound in the right foot.  Was bleeding for a few days prior to presentation.  Had x-rays done as an outpatient was started on ciprofloxacin and Augmentin by his podiatrist.  Was admitted for ABIs and found to have significant peripheral arterial disease.  Now status post 3 angiographies revascularization.  MRI imaging survey concerning for diffuse osteomyelitis including remnants of fifth metatarsal base fourth and third metatarsal  Podiatry on consult.  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  Patient tolerated procedure well but is having some postoperative pain.   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 Moderate postoperative pain Plan: Continue vancomycin and Unasyn Multimodal pain control Follow biopsy and culture from OR Appreciate infectious podiatry follow-up  Type 2 diabetes mellitus with hyperglycemia and hyperlipidemia Plan: Hold home glimepiride and Metformin Continue basal bolus regimen Continue  pravastatin  Peripheral vascular disease Status post angioplasty x3 Currently aspirin and Plavix on hold Will resume today if okay with podiatry  Essential hypertension Well-controlled on current regimen Plan: Continue Norvasc, propanolol, ramipril   DVT prophylaxis: SCD Code Status: Full Family Communication: None today.  Offered to call but patient declined Disposition Plan: Status is: Inpatient  Remains inpatient appropriate because:Inpatient level of care appropriate due to severity of illness   Dispo: The patient is from: Home              Anticipated d/c is to: SNF              Patient currently is not medically stable to d/c.   Difficult to place patient No  Postop day 1 status post transmetatarsal amputation.  Pending surgical culture and bone biopsy.  Therapy evaluations to begin today.  Multimodal pain control.  Anticipate need for skilled nursing facility at time of discharge.   Level of care: Med-Surg  Consultants:   ID  Podiatry  Vascular surgery  Procedures:   Angioplasty x3  Transmetatarsal amputation  Antimicrobials:   Vancomycin  Unasyn   Subjective: Patient seen and examined.  Moderate pain control today.  Postop day 1  Objective: Vitals:   04/02/20 1900 04/02/20 2110 04/03/20 0423 04/03/20 0801  BP: (!) 173/75 (!) 133/56 135/63 (!) 157/66  Pulse: (!) 55 (!) 59 (!) 53 (!) 54  Resp: 16 16 20 16   Temp: 97.7 F (36.5 C) 98 F (36.7 C) 98 F (36.7 C) 97.9 F (36.6 C)  TempSrc: Oral Oral  Oral  SpO2: 97% 97% 95% 99%  Weight:      Height:        Intake/Output Summary (Last  24 hours) at 04/03/2020 1055 Last data filed at 04/03/2020 1000 Gross per 24 hour  Intake 2279.64 ml  Output 1053 ml  Net 1226.64 ml   Filed Weights   03/29/20 1514 03/29/20 2315 03/31/20 1548  Weight: 99.8 kg 96.4 kg 96.4 kg    Examination:  General exam: Appears calm and comfortable  Respiratory system: Clear to auscultation. Respiratory effort  normal. Cardiovascular system: S1 & S2 heard, RRR. No JVD, murmurs, rubs, gallops or clicks. No pedal edema. Gastrointestinal system: Abdomen is nondistended, soft and nontender. No organomegaly or masses felt. Normal bowel sounds heard. Central nervous system: Alert and oriented. No focal neurological deficits. Extremities: Right foot in surgical dressings.  Not removed.  Dressing CDI skin: No rashes, lesions or ulcers Psychiatry: Judgement and insight appear normal. Mood & affect appropriate.     Data Reviewed: I have personally reviewed following labs and imaging studies  CBC: Recent Labs  Lab 03/29/20 1616 03/30/20 0515 04/03/20 0128  WBC 6.2 4.9 8.2  NEUTROABS 4.0 3.0 5.9  HGB 11.8* 11.6* 11.1*  HCT 35.3* 32.7* 33.7*  MCV 86.7 84.7 86.4  PLT 286 259 236   Basic Metabolic Panel: Recent Labs  Lab 03/29/20 1616 03/30/20 0515 03/31/20 0724 04/01/20 0538 04/02/20 0547 04/03/20 0128  NA 137 137  --  138  --  138  K 3.7 3.7  --  3.7  --  3.5  CL 103 107  --  110  --  109  CO2 26 23  --  23  --  24  GLUCOSE 104* 197*  --  174*  --  195*  BUN 22 17  --  17  --  15  CREATININE 0.87 0.73 0.87 0.82 0.83 0.84  CALCIUM 9.7 9.0  --  8.8*  --  8.7*  MG 1.5* 1.5* 1.8  --   --  1.7  PHOS  --  2.9  --   --   --   --    GFR: Estimated Creatinine Clearance: 89.9 mL/min (by C-G formula based on SCr of 0.84 mg/dL). Liver Function Tests: Recent Labs  Lab 03/29/20 1616 03/30/20 0515  AST 22 19  ALT 22 19  ALKPHOS 79 75  BILITOT 0.7 0.7  PROT 7.6 6.8  ALBUMIN 3.3* 2.9*   No results for input(s): LIPASE, AMYLASE in the last 168 hours. No results for input(s): AMMONIA in the last 168 hours. Coagulation Profile: No results for input(s): INR, PROTIME in the last 168 hours. Cardiac Enzymes: No results for input(s): CKTOTAL, CKMB, CKMBINDEX, TROPONINI in the last 168 hours. BNP (last 3 results) No results for input(s): PROBNP in the last 8760 hours. HbA1C: No results for  input(s): HGBA1C in the last 72 hours. CBG: Recent Labs  Lab 04/02/20 1759 04/02/20 1950 04/02/20 2332 04/03/20 0418 04/03/20 0801  GLUCAP 115* 130* 220* 152* 94   Lipid Profile: No results for input(s): CHOL, HDL, LDLCALC, TRIG, CHOLHDL, LDLDIRECT in the last 72 hours. Thyroid Function Tests: No results for input(s): TSH, T4TOTAL, FREET4, T3FREE, THYROIDAB in the last 72 hours. Anemia Panel: No results for input(s): VITAMINB12, FOLATE, FERRITIN, TIBC, IRON, RETICCTPCT in the last 72 hours. Sepsis Labs: No results for input(s): PROCALCITON, LATICACIDVEN in the last 168 hours.  Recent Results (from the past 240 hour(s))  SARS CORONAVIRUS 2 (TAT 6-24 HRS) Nasopharyngeal Nasopharyngeal Swab     Status: None   Collection Time: 03/29/20  7:41 PM   Specimen: Nasopharyngeal Swab  Result Value Ref Range Status  SARS Coronavirus 2 NEGATIVE NEGATIVE Final    Comment: (NOTE) SARS-CoV-2 target nucleic acids are NOT DETECTED.  The SARS-CoV-2 RNA is generally detectable in upper and lower respiratory specimens during the acute phase of infection. Negative results do not preclude SARS-CoV-2 infection, do not rule out co-infections with other pathogens, and should not be used as the sole basis for treatment or other patient management decisions. Negative results must be combined with clinical observations, patient history, and epidemiological information. The expected result is Negative.  Fact Sheet for Patients: HairSlick.no  Fact Sheet for Healthcare Providers: quierodirigir.com  This test is not yet approved or cleared by the [place] FDA and  has been authorized for detection and/or diagnosis of SARS-CoV-2 by FDA under an Emergency Use Authorization (EUA). This EUA will remain  in effect (meaning this test can be used) for the duration of the COVID-19 declaration under Se ction 564(b)(1) of the Act, 21 U.S.C. section  360bbb-3(b)(1), unless the authorization is terminated or revoked sooner.  Performed at [facility], [address], [place], [place] [address]   Blood culture (routine x 2)     Status: None   Collection Time: 03/29/20  7:41 PM   Specimen: BLOOD  Result Value Ref Range Status   Specimen Description BLOOD LEFT ANTECUBITAL  Final   Special Requests   Final    BOTTLES DRAWN AEROBIC AND ANAEROBIC Blood Culture results may not be optimal due to an inadequate volume of blood received in culture bottles   Culture   Final    NO GROWTH 5 DAYS Performed at [facility], [address], [place], [place] [address]    Report Status 04/03/2020 FINAL  Final  Blood culture (routine x 2)     Status: None   Collection Time: 03/29/20  7:41 PM   Specimen: BLOOD  Result Value Ref Range Status   Specimen Description BLOOD BLOOD RIGHT FOREARM  Final   Special Requests   Final    BOTTLES DRAWN AEROBIC AND ANAEROBIC Blood Culture adequate volume   Culture   Final    NO GROWTH 5 DAYS Performed at [facility], [address], [place], [place] [address]    Report Status 04/03/2020 FINAL  Final  Aerobic Culture w Gram Stain (superficial specimen)     Status: None (Preliminary result)   Collection Time: 04/01/20  8:32 PM   Specimen: Wound  Result Value Ref Range Status   Specimen Description   Final    WOUND Performed at [facility], [address] [place], [place] [address]    Special Requests   Final    NONE Performed at [facility], [address] [place], [place] [address]    Gram Stain   Final    RARE WBC PRESENT, PREDOMINANTLY PMN RARE GRAM POSITIVE COCCI    Culture   Final    CULTURE REINCUBATED FOR BETTER GROWTH Performed at [facility], [address], [place], [place] [address]    Report Status PENDING  Incomplete  Surgical PCR screen     Status: None   Collection Time: 04/02/20  3:30 AM   Specimen: Nasal Mucosa; Nasal Swab   Result Value Ref Range Status   MRSA, PCR NEGATIVE NEGATIVE Final   Staphylococcus aureus NEGATIVE NEGATIVE Final    Comment: (NOTE) The Xpert SA Assay (FDA approved for NASAL specimens in patients 18 years of age and older), is one component of a comprehensive surveillance program. It  is not intended to diagnose infection nor to guide or monitor treatment. Performed at [facility], [address] [place], [place] [address]   Aerobic/Anaerobic Culture w Gram Stain (surgical/deep wound)     Status: None (Preliminary result)   Collection Time: 04/02/20  5:13 PM   Specimen: PATH Bone biopsy; Tissue  Result Value Ref Range Status   Specimen Description   Final    WOUND Performed at [facility], [address] [place], [place] [address]    Special Requests   Final    BONE BX RIGHT FOOT Performed at [facility], [address] [place], [place] [address]    Gram Stain   Final    NO WBC SEEN NO ORGANISMS SEEN Performed at [facility], [address], [place], [place] [address]    Culture PENDING  Incomplete   Report Status PENDING  Incomplete         Radiology Studies: DG Foot Complete Right  Result Date: 04/02/2020 CLINICAL DATA:  Transmetatarsal amputation EXAM: RIGHT FOOT COMPLETE - 3+ VIEW COMPARISON:  03/29/2020 FINDINGS: Frontal, oblique, lateral views of the right foot are obtained. Postsurgical changes from transmetatarsal amputation through the base of the first through fifth metatarsals. Prior bony fusion within the lateral aspect of the midfoot. Likely antibiotic impregnated beads within the surgical bed. Diffuse soft tissue edema. IMPRESSION: 1. Postsurgical changes from transmetatarsal amputation right foot. Electronically Signed   By: Sharlet Salina M.D.   On: 04/02/2020 19:02        Scheduled Meds: . amLODipine  5 mg Oral Daily  . vitamin C  250 mg Oral BID  . gabapentin  300 mg Oral TID  . insulin aspart  0-9 Units  Subcutaneous Q4H  . insulin glargine  5 Units Subcutaneous QHS  . multivitamin-lutein  1 capsule Oral Daily  . mupirocin ointment  1 application Nasal BID  . nutrition supplement (JUVEN)  1 packet Oral BID BM  . pravastatin  40 mg Oral Daily  . propranolol  40 mg Oral TID  . Ensure Max Protein  11 oz Oral Daily  . ramipril  10 mg Oral Daily   Continuous Infusions: . sodium chloride Stopped (04/03/20 0637)  . ampicillin-sulbactam (UNASYN) IV Stopped (04/03/20 0559)  . vancomycin 1,250 mg (04/03/20 1039)     LOS: 5 days    Time spent: 25 minutes    Tresa Moore, MD [facility] Pager [phone]  If 7PM-7AM, please contact night-coverage 04/03/2020, 10:55 AM

## 2020-04-03 NOTE — Progress Notes (Signed)
Pharmacy Antibiotic Note  Jason [name] is a 75 y.o. male with medical history including diabetes and PAD admitted on 03/29/2020 with R foot infection complicated by osteomyelitis.  Pharmacy has been consulted for vancomycin  dosing.  Today, 04/03/20  --Day # 6 vancomycin --Cefepime changed to Unasyn yesterday by ID --Right transmetatarsal amputation today  Plan: 3/23@0128  Vp: 33 3/24@1010 : Vt: 20 Will adjust dose based on levels  Vancomycin 2000 mg IV Q 24 hrs. Goal AUC 400-550. Expected AUC: 518.6 Expected Cmin: 10.6  Awaiting susceptibilities currently. If preop cx from 3/22 is not MRSA, plan to stop vancomycin.    Height: 5\' 10"  (177.8 cm) Weight: 96.4 kg (212 lb 8.4 oz) IBW/kg (Calculated) : 73  Temp (24hrs), Avg:97.7 F (36.5 C), Min:96.8 F (36 C), Max:98.7 F (37.1 C)  Recent Labs  Lab 03/29/20 1616 03/30/20 0515 03/31/20 0724 04/01/20 0538 04/02/20 0547 04/03/20 0128 04/03/20 1010  WBC 6.2 4.9  --   --   --  8.2  --   CREATININE 0.87 0.73 0.87 0.82 0.83 0.84  --   VANCOTROUGH  --   --   --   --   --   --  20  VANCOPEAK  --   --   --   --   --  33  --     Estimated Creatinine Clearance: 89.9 mL/min (by C-G formula based on SCr of 0.84 mg/dL).    No Known Allergies  Antimicrobials this admission: Vancomycin 3/19 >> Cefepime 3/19 >> 3/22 Unasyn 3/23 >>  (cipro and augmentin prior to admission)  Microbiology results: 3/23 MRSA PCR: (-) 3/22 Wound Cx: GPC, pending 3/19 BCx NGTD   4/19 A Jason [name] 04/03/2020

## 2020-04-03 NOTE — Plan of Care (Signed)

## 2020-04-04 DIAGNOSIS — M86072 Acute hematogenous osteomyelitis, left ankle and foot: Secondary | ICD-10-CM | POA: Diagnosis not present

## 2020-04-04 DIAGNOSIS — E08621 Diabetes mellitus due to underlying condition with foot ulcer: Secondary | ICD-10-CM | POA: Diagnosis not present

## 2020-04-04 DIAGNOSIS — L97511 Non-pressure chronic ulcer of other part of right foot limited to breakdown of skin: Secondary | ICD-10-CM | POA: Diagnosis not present

## 2020-04-04 LAB — CBC WITH DIFFERENTIAL/PLATELET
Abs Immature Granulocytes: 0.04 10*3/uL (ref 0.00–0.07)
Basophils Absolute: 0 10*3/uL (ref 0.0–0.1)
Basophils Relative: 0 %
Eosinophils Absolute: 0.2 10*3/uL (ref 0.0–0.5)
Eosinophils Relative: 3 %
HCT: 31.9 % — ABNORMAL LOW (ref 39.0–52.0)
Hemoglobin: 10.7 g/dL — ABNORMAL LOW (ref 13.0–17.0)
Immature Granulocytes: 1 %
Lymphocytes Relative: 18 %
Lymphs Abs: 1.4 10*3/uL (ref 0.7–4.0)
MCH: 29 pg (ref 26.0–34.0)
MCHC: 33.5 g/dL (ref 30.0–36.0)
MCV: 86.4 fL (ref 80.0–100.0)
Monocytes Absolute: 1 10*3/uL (ref 0.1–1.0)
Monocytes Relative: 13 %
Neutro Abs: 4.8 10*3/uL (ref 1.7–7.7)
Neutrophils Relative %: 65 %
Platelets: 241 10*3/uL (ref 150–400)
RBC: 3.69 MIL/uL — ABNORMAL LOW (ref 4.22–5.81)
RDW: 12.3 % (ref 11.5–15.5)
WBC: 7.5 10*3/uL (ref 4.0–10.5)
nRBC: 0 % (ref 0.0–0.2)

## 2020-04-04 LAB — AEROBIC CULTURE W GRAM STAIN (SUPERFICIAL SPECIMEN)

## 2020-04-04 LAB — GLUCOSE, CAPILLARY
Glucose-Capillary: 193 mg/dL — ABNORMAL HIGH (ref 70–99)
Glucose-Capillary: 179 mg/dL — ABNORMAL HIGH (ref 70–99)
Glucose-Capillary: 192 mg/dL — ABNORMAL HIGH (ref 70–99)
Glucose-Capillary: 278 mg/dL — ABNORMAL HIGH (ref 70–99)
Glucose-Capillary: 238 mg/dL — ABNORMAL HIGH (ref 70–99)
Glucose-Capillary: 213 mg/dL — ABNORMAL HIGH (ref 70–99)
Glucose-Capillary: 176 mg/dL — ABNORMAL HIGH (ref 70–99)

## 2020-04-04 LAB — BASIC METABOLIC PANEL
Anion gap: 5 (ref 5–15)
BUN: 25 mg/dL — ABNORMAL HIGH (ref 8–23)
CO2: 25 mmol/L (ref 22–32)
Calcium: 8.8 mg/dL — ABNORMAL LOW (ref 8.9–10.3)
Chloride: 106 mmol/L (ref 98–111)
Creatinine, Ser: 0.83 mg/dL (ref 0.61–1.24)
GFR, Estimated: >60 mL/min (ref >60)
Glucose, Bld: 200 mg/dL — ABNORMAL HIGH (ref 70–99)
Potassium: 3.5 mmol/L (ref 3.5–5.1)
Sodium: 136 mmol/L (ref 135–145)

## 2020-04-04 LAB — MAGNESIUM: Magnesium: 1.9 mg/dL (ref 1.7–2.4)

## 2020-04-04 LAB — SURGICAL PATHOLOGY

## 2020-04-04 MED ORDER — INSULIN ASPART 100 UNIT/ML ~~LOC~~ SOLN
2.0000 [IU] | Freq: Three times a day (TID) | SUBCUTANEOUS | Status: DC
  Administered 2020-04-04 – 2020-04-05 (×4): 2 [IU] via SUBCUTANEOUS
  Filled 2020-04-04 (×4): qty 1

## 2020-04-04 MED ORDER — INSULIN GLARGINE 100 UNIT/ML ~~LOC~~ SOLN
8.0000 [IU] | Freq: Every day | SUBCUTANEOUS | Status: DC
  Administered 2020-04-04: 8 [IU] via SUBCUTANEOUS
  Filled 2020-04-04 (×2): qty 0.08

## 2020-04-04 NOTE — Progress Notes (Signed)
ID Pt doing well No complaints O/e Awake and alert Patient Vitals for the past 24 hrs:  BP Temp Pulse Resp SpO2  04/04/20 1613 (!) 154/64 98.4 F (36.9 C) (!) 57 16 98 %  04/04/20 1201 (!) 159/68 98.4 F (36.9 C) (!) 57 15 98 %  04/04/20 0719 (!) 145/61 98.4 F (36.9 C) (!) 51 15 98 %  04/04/20 0510 (!) 141/67 98 F (36.7 C) (!) 57 16 98 %  04/03/20 2005 137/62 99 F (37.2 C) (!) 58 16 97 %    Chest CTA [id number] abd soft Rt foot surgical dressing- picture reviewed   CNS non focal   Labs CBC Latest Ref Rng & Units 04/04/2020 04/03/2020 03/30/2020  WBC 4.0 - 10.5 K/uL 7.5 8.2 4.9  Hemoglobin 13.0 - 17.0 g/dL 10.7(L) 11.1(L) 11.6(L)  Hematocrit 39.0 - 52.0 % 31.9(L) 33.7(L) 32.7(L)  Platelets 150 - 400 K/uL 241 236 259    CMP Latest Ref Rng & Units 04/04/2020 04/03/2020 04/02/2020  Glucose 70 - 99 mg/dL 875(I) 433(I) -  BUN 8 - 23 mg/dL 95(J) 15 -  Creatinine 0.61 - 1.24 mg/dL 8.84 1.66 0.63  Sodium 135 - 145 mmol/L 136 138 -  Potassium 3.5 - 5.1 mmol/L 3.5 3.5 -  Chloride 98 - 111 mmol/L 106 109 -  CO2 22 - 32 mmol/L 25 24 -  Calcium 8.9 - 10.3 mg/dL 0.1(S) 0.1(U) -  Total Protein 6.5 - 8.1 g/dL - - -  Total Bilirubin 0.3 - 1.2 mg/dL - - -  Alkaline Phos 38 - 126 U/L - - -  AST 15 - 41 U/L - - -  ALT 0 - 44 U/L - - -  Micro Superficial wound culture MSSA Deep culture no growth so far Pathology pending  Impression/recommendation  Right foot infection with underlying diabetes mellitus and peripheral artery disease.  Underwent TMA.  Site looks okay.  Culture from the surgical procedure no growth so far.  Wound culture that was done prior to the surgery which was a superficial culture is MSSA.  Patient is currently on Unasyn.  Looks like a curative amputation.  On discharge he will go on Augmentin for 10 to 14 days. He will follow up with podiatrist as outpatient.  Diabetes mellitus on insulin  Hypertension ramipril and amlodipine  Hyperlipidemia on Crestor  10  Discussed the management with the patient and the hospitalist and podiatrist. ID will sign off call if needed.

## 2020-04-04 NOTE — Plan of Care (Signed)

## 2020-04-04 NOTE — Care Management Important Message (Signed)
Important Message  Patient Details  Name: Jason [name] MRN: [id number] Date of Birth: [birth year]   Medicare Important Message Given:  Yes     Olegario Messier A Shams Fill 04/04/2020, 9:51 AM

## 2020-04-04 NOTE — TOC Initial Note (Signed)
Transition of Care [facility]) - Initial/Assessment Note    Patient Details  Name: Jason [name] MRN: [id number] Date of Birth: Sep 28, 1945  Transition of Care [facility]) CM/SW Contact:    Caryn Section, RN Phone Number: 04/04/2020, 1:51 PM  Clinical Narrative:     TOC in to see patient at bedside, stepdaughter in room, all amenable to discussing discharge planning.  Discharge plan is to go home with home health.  Patient and family have no preferences at this time.  Patient lives with Step Daughter, no concerns about getting to appointments or getting medications or supplies.  Patient verbalized he is unable to change his foot dressing, and Step Daughter cannot tolerate dressing changes, requests this service to be added to home health.  Inquiry in with MD, awaiting response.  TOC contact information given.              Expected Discharge Plan: Home w Home Health Services Barriers to Discharge: Continued Medical Work up   Patient Goals and CMS Choice     Choice offered to / list presented to : NA  Expected Discharge Plan and Services Expected Discharge Plan: Home w Home Health Services                                              Prior Living Arrangements/Services   Lives with:: Self,Relatives (Step Daughter) Patient language and need for interpreter reviewed:: Yes Do you feel safe going back to the place where you live?: Yes      Need for Family Participation in Patient Care: Yes (Comment) Care giver support system in place?: Yes (comment)   Criminal Activity/Legal Involvement Pertinent to Current Situation/Hospitalization: No - Comment as needed  Activities of Daily Living Home Assistive Devices/Equipment: None ADL Screening (condition at time of admission) Patient's cognitive ability adequate to safely complete daily activities?: Yes Is the patient deaf or have difficulty hearing?: Yes Does the patient have difficulty seeing, even when wearing glasses/contacts?:  No Does the patient have difficulty concentrating, remembering, or making decisions?: No Patient able to express need for assistance with ADLs?: Yes Does the patient have difficulty dressing or bathing?: No Independently performs ADLs?: Yes (appropriate for developmental age) Does the patient have difficulty walking or climbing stairs?: No Weakness of Legs: None Weakness of Arms/Hands: None  Permission Sought/Granted Permission sought to share information with : Photographer granted to share info w AGENCY: Home Health Agency inquires,        Emotional Assessment Appearance:: Appears stated age Attitude/Demeanor/Rapport: Engaged,Gracious Affect (typically observed): Appropriate,Pleasant Orientation: : Oriented to Self,Oriented to Place,Oriented to  Time,Oriented to Situation   Psych Involvement: No (comment)  Admission diagnosis:  Cellulitis [L03.90] Diabetic foot ulcer (HCC) [W29.562, L97.509] Osteoarthritis of right foot, unspecified osteoarthritis type [M19.071] Diabetic ulcer of right foot associated with diabetes mellitus due to underlying condition, limited to breakdown of skin, unspecified part of foot (HCC) [Z30.865, L97.511] Patient Active Problem List   Diagnosis Date Noted  . Pyogenic inflammation of bone (HCC)   . Diabetic ulcer of right midfoot associated with type 2 diabetes mellitus, with muscle involvement without evidence of necrosis (HCC)   . PVD (peripheral vascular disease) (HCC)   . Cellulitis 03/29/2020  . History of CVA (cerebrovascular accident) 03/29/2020  . CAD (coronary artery disease) 03/29/2020  . Essential hypertension  03/29/2020  . Type 2 diabetes mellitus with hyperlipidemia (HCC) 03/29/2020  . 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 Cellulitis in diabetic foot (HCC) 08/20/2016   PCP:  Marina Goodell, MD Pharmacy:   [facility] [address] [place] - [address] [place]  [address] Phone: [phone] Fax: [phone]     Social Determinants of Health (SDOH) Interventions    Readmission Risk Interventions No flowsheet data found.

## 2020-04-04 NOTE — Progress Notes (Signed)
Physical Therapy Treatment Patient Details Name: Jason [name] MRN: [id number] DOB: April 22, 1945 Today's Date: 04/04/2020    History of Present Illness 75 y/o s/p transmetatarsal amputation with bone biopsy and culture on 3/23.    PT Comments    Pt seen this am, appears to be moving well. Good demonstration of ambulation with support of RW while maintaining NWB R LE.  Discussed POC with pt and Pt's step daughter.  Educated on stair negotiation - pt verbalized good understanding without concerns.  Pt appears functionally ready for d/c home once medically cleared.  Continue to recommend HHPT   Follow Up Recommendations  Home health PT;Supervision for mobility/OOB     Equipment Recommendations  Rolling walker with 5" wheels    Recommendations for Other Services       Precautions / Restrictions Precautions Precautions: Fall Required Braces or Orthoses:  (CAM Boot R LE) Restrictions Weight Bearing Restrictions: Yes RLE Weight Bearing: Non weight bearing    Mobility  Bed Mobility Overal bed mobility: Independent             General bed mobility comments: no physical assistance needed    Transfers Overall transfer level: Needs assistance Equipment used: Rolling walker (2 wheeled) Transfers: Sit to/from [employer] Sit to Stand: Min guard Stand pivot transfers:  (VC's for safe technique)       General transfer comment: cuing for technique while maintaining NWB status  Ambulation/Gait Ambulation/Gait assistance: Modified independent (Device/Increase time) Gait Distance (Feet): 30 Feet Assistive device: Rolling walker (2 wheeled)       General Gait Details: 3-point, step to gait pattern leading with R LE; min cuing to decreased L step length to maximize heel WBing R LE.  Good gait mechanics, safety awareness and overall control with gait efforts.   Stairs Stairs: Yes       General stair comments:  (Pt states he's comfortable with stair  negotiation, able to verbalize proper technique.)   Wheelchair Mobility    Modified Rankin (Stroke Patients Only)       Balance                                            Cognition Arousal/Alertness: Awake/alert Behavior During Therapy: WFL for tasks assessed/performed Overall Cognitive Status: Within Functional Limits for tasks assessed                                 General Comments: very pleasant and cooperative      Exercises      General Comments        Pertinent Vitals/Pain Pain Assessment: No/denies pain    Home Living                      Prior Function            PT Goals (current goals can now be found in the care plan section)      Frequency           PT Plan      Co-evaluation              AM-PAC PT "6 Clicks" Mobility   Outcome Measure  Help needed turning from your back to your side while in a flat bed without using bedrails?: None Help needed moving  from lying on your back to sitting on the side of a flat bed without using bedrails?: None Help needed moving to and from a bed to a chair (including a wheelchair)?: A Little Help needed standing up from a chair using your arms (e.g., wheelchair or bedside chair)?: A Little Help needed to walk in hospital room?: A Little Help needed climbing 3-5 steps with a railing? : A Lot 6 Click Score: 19    End of Session Equipment Utilized During Treatment: Gait belt Activity Tolerance: Patient tolerated treatment well Patient left: in bed;with call bell/phone within reach Nurse Communication: Mobility status PT Visit Diagnosis: Difficulty in walking, not elsewhere classified (R26.2)     Time: 1610-9604 PT Time Calculation (min) (ACUTE ONLY): 33 min  Charges:  $Gait Training: 8-22 mins $Therapeutic Activity: 8-22 mins                     Zadie Cleverly, PTA  Jannet Askew 04/04/2020, 2:51 PM

## 2020-04-04 NOTE — Progress Notes (Signed)
PROGRESS NOTE    Jason [name]  KKX:[id number] DOB: [birth year] DOA: 03/29/2020 PCP: Marina Goodell, MD  Brief Narrative: (Start on day 1 of progress note - keep it brief and live) 75 y.o. male with medical history significant of CAD, DM, HTN, HDL, PVD,  tremor, CVA occlusion of basilar artery   Presented with acute on chronic wound in the right foot.  Was bleeding for a few days prior to presentation.  Had x-rays done as an outpatient was started on ciprofloxacin and Augmentin by his podiatrist.  Was admitted for ABIs and found to have significant peripheral arterial disease.  Now status post 3 angiographies revascularization.  MRI imaging survey concerning for diffuse osteomyelitis including remnants of fifth metatarsal base fourth and third metatarsal  Podiatry on consult.  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  Patient tolerated procedure well but is having some postoperative pain.  Postoperative pain control improved after introduction of gabapentin.  Patient in good spirits.  Surgical site looks clean.  Per ID high likelihood of being able to transition to oral antibiotics.  Pending final surgical pathology.   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 Mild postoperative pain Pending surgical pathology Plan: Continue vancomycin and  Unasyn for now Anticipate transition to oral antibiotics if surgical margins are clear Continue multimodal pain control Follow surgical biopsy and culture, no growth to date Appreciate infectious podiatry follow-up Possible discharge home with home health services in 24 hours  Type 2 diabetes mellitus with hyperglycemia and hyperlipidemia Plan: Hold home glimepiride and Metformin Continue basal bolus regimen Continue pravastatin  Peripheral vascular disease Status post angioplasty x3 Dual antiplatelet therapy restarted 3/24  Essential hypertension Well-controlled on current regimen Plan: Continue Norvasc, propanolol, ramipril   DVT prophylaxis: SCD Code Status: Full Family Communication: None today.  Offered to call but patient declined Disposition Plan: Status is: Inpatient  Remains inpatient appropriate because:Inpatient level of care appropriate due to severity of illness   Dispo: The patient is from: Home              Anticipated d/c is to: SNF              Patient currently is not medically stable to d/c.   Difficult to place patient No  Postop day 2 status post transmetatarsal amputation.  Pending surgical pathology.  Pain well controlled.  Surgical site appears clean.  Possible discharge home with home health in 24 hours if we are able to transition oral antibiotics.   Level of care: Med-Surg  Consultants:   ID  Podiatry  Vascular surgery  Procedures:   Angioplasty x3  Transmetatarsal amputation  Antimicrobials:   Vancomycin  Unasyn   Subjective: Patient seen and examined.  No pain reported today.  Postoperative day #2  Objective: Vitals:   04/03/20 1545 04/03/20 2005 04/04/20 0510 04/04/20 0719  BP: 134/73 137/62 (!) 141/67 (!) 145/61  Pulse: 62 (!)  58 (!) 57 (!) 51  Resp: 20 16 16 15   Temp:  99 F (37.2 C) 98 F (36.7 C) 98.4 F (36.9 C)  TempSrc:      SpO2: 97% 97% 98% 98%  Weight:      Height:        Intake/Output Summary  (Last 24 hours) at 04/04/2020 1011 Last data filed at 04/03/2020 2215 Gross per 24 hour  Intake 480 ml  Output 425 ml  Net 55 ml   Filed Weights   03/29/20 1514 03/29/20 2315 03/31/20 1548  Weight: 99.8 kg 96.4 kg 96.4 kg    Examination:  General exam: Appears calm and comfortable  Respiratory system: Clear to auscultation. Respiratory effort normal. Cardiovascular system: S1 & S2 heard, RRR. No JVD, murmurs, rubs, gallops or clicks. No pedal edema. Gastrointestinal system: Abdomen is nondistended, soft and nontender. No organomegaly or masses felt. Normal bowel sounds heard. Central nervous system: Alert and oriented. No focal neurological deficits. Extremities: Right foot in surgical dressings.  Not removed.  Dressing CDI skin: No rashes, lesions or ulcers Psychiatry: Judgement and insight appear normal. Mood & affect appropriate.     Data Reviewed: I have personally reviewed following labs and imaging studies  CBC: Recent Labs  Lab 03/29/20 1616 03/30/20 0515 04/03/20 0128 04/04/20 0538  WBC 6.2 4.9 8.2 7.5  NEUTROABS 4.0 3.0 5.9 4.8  HGB 11.8* 11.6* 11.1* 10.7*  HCT 35.3* 32.7* 33.7* 31.9*  MCV 86.7 84.7 86.4 86.4  PLT 286 259 236 241   Basic Metabolic Panel: Recent Labs  Lab 03/29/20 1616 03/30/20 0515 03/31/20 0724 04/01/20 0538 04/02/20 0547 04/03/20 0128 04/04/20 0538  NA 137 137  --  138  --  138 136  K 3.7 3.7  --  3.7  --  3.5 3.5  CL 103 107  --  110  --  109 106  CO2 26 23  --  23  --  24 25  GLUCOSE 104* 197*  --  174*  --  195* 200*  BUN 22 17  --  17  --  15 25*  CREATININE 0.87 0.73 0.87 0.82 0.83 0.84 0.83  CALCIUM 9.7 9.0  --  8.8*  --  8.7* 8.8*  MG 1.5* 1.5* 1.8  --   --  1.7 1.9  PHOS  --  2.9  --   --   --   --   --    GFR: Estimated Creatinine Clearance: 91 mL/min (by C-G formula based on SCr of 0.83 mg/dL). Liver Function Tests: Recent Labs  Lab 03/29/20 1616 03/30/20 0515  AST 22 19  ALT 22 19  ALKPHOS 79 75  BILITOT 0.7  0.7  PROT 7.6 6.8  ALBUMIN 3.3* 2.9*   No results for input(s): LIPASE, AMYLASE in the last 168 hours. No results for input(s): AMMONIA in the last 168 hours. Coagulation Profile: No results for input(s): INR, PROTIME in the last 168 hours. Cardiac Enzymes: No results for input(s): CKTOTAL, CKMB, CKMBINDEX, TROPONINI in the last 168 hours. BNP (last 3 results) No results for input(s): PROBNP in the last 8760 hours. HbA1C: No results for input(s): HGBA1C in the last 72 hours. CBG: Recent Labs  Lab 04/03/20 1956 04/03/20 2350 04/04/20 0424 04/04/20 0722 04/04/20 0811  GLUCAP 287* 264* 193* 179* 192*   Lipid Profile: No results for input(s): CHOL, HDL, LDLCALC, TRIG, CHOLHDL, LDLDIRECT in the last 72 hours. Thyroid Function Tests: No results for input(s): TSH, T4TOTAL, FREET4, T3FREE, THYROIDAB  in the last 72 hours. Anemia Panel: No results for input(s): VITAMINB12, FOLATE, FERRITIN, TIBC, IRON, RETICCTPCT in the last 72 hours. Sepsis Labs: No results for input(s): PROCALCITON, LATICACIDVEN in the last 168 hours.  Recent Results (from the past 240 hour(s))  SARS CORONAVIRUS 2 (TAT 6-24 HRS) Nasopharyngeal Nasopharyngeal Swab     Status: None   Collection Time: 03/29/20  7:41 PM   Specimen: Nasopharyngeal Swab  Result Value Ref Range Status   SARS Coronavirus 2 NEGATIVE NEGATIVE Final    Comment: (NOTE) SARS-CoV-2 target nucleic acids are NOT DETECTED.  The SARS-CoV-2 RNA is generally detectable in upper and lower respiratory specimens during the acute phase of infection. Negative results do not preclude SARS-CoV-2 infection, do not rule out co-infections with other pathogens, and should not be used as the sole basis for treatment or other patient management decisions. Negative results must be combined with clinical observations, patient history, and epidemiological information. The expected result is Negative.  Fact Sheet for  Patients: HairSlick.no  Fact Sheet for Healthcare Providers: quierodirigir.com  This test is not yet approved or cleared by the [place] FDA and  has been authorized for detection and/or diagnosis of SARS-CoV-2 by FDA under an Emergency Use Authorization (EUA). This EUA will remain  in effect (meaning this test can be used) for the duration of the COVID-19 declaration under Se ction 564(b)(1) of the Act, 21 U.S.C. section 360bbb-3(b)(1), unless the authorization is terminated or revoked sooner.  Performed at [facility], [address], [place], [place] [address]   Blood culture (routine x 2)     Status: None   Collection Time: 03/29/20  7:41 PM   Specimen: BLOOD  Result Value Ref Range Status   Specimen Description BLOOD LEFT ANTECUBITAL  Final   Special Requests   Final    BOTTLES DRAWN AEROBIC AND ANAEROBIC Blood Culture results may not be optimal due to an inadequate volume of blood received in culture bottles   Culture   Final    NO GROWTH 5 DAYS Performed at [facility] [address], [place], [place] [address]    Report Status 04/03/2020 FINAL  Final  Blood culture (routine x 2)     Status: None   Collection Time: 03/29/20  7:41 PM   Specimen: BLOOD  Result Value Ref Range Status   Specimen Description BLOOD BLOOD RIGHT FOREARM  Final   Special Requests   Final    BOTTLES DRAWN AEROBIC AND ANAEROBIC Blood Culture adequate volume   Culture   Final    NO GROWTH 5 DAYS Performed at [facility], [address] [place], [place] [address]    Report Status 04/03/2020 FINAL  Final  Aerobic Culture w Gram Stain (superficial specimen)     Status: None (Preliminary result)   Collection Time: 04/01/20  8:32 PM   Specimen: Wound  Result Value Ref Range Status   Specimen Description   Final    WOUND Performed at [facility], [address], [place], [place] [address]     Special Requests   Final    NONE Performed at [facility], [address] [place], [place] [address]    Gram Stain   Final    RARE WBC PRESENT, PREDOMINANTLY PMN RARE GRAM POSITIVE COCCI    Culture   Final    RARE STAPHYLOCOCCUS AUREUS SUSCEPTIBILITIES TO FOLLOW Performed at [facility], [address] [place], [place] [address]  Report Status PENDING  Incomplete  Surgical PCR screen     Status: None   Collection Time: 04/02/20  3:30 AM   Specimen: Nasal Mucosa; Nasal Swab  Result Value Ref Range Status   MRSA, PCR NEGATIVE NEGATIVE Final   Staphylococcus aureus NEGATIVE NEGATIVE Final    Comment: (NOTE) The Xpert SA Assay (FDA approved for NASAL specimens in patients 80 years of age and older), is one component of a comprehensive surveillance program. It is not intended to diagnose infection nor to guide or monitor treatment. Performed at [facility], [address] [place], [place] [address]   Aerobic/Anaerobic Culture w Gram Stain (surgical/deep wound)     Status: None (Preliminary result)   Collection Time: 04/02/20  5:13 PM   Specimen: PATH Bone biopsy; Tissue  Result Value Ref Range Status   Specimen Description   Final    WOUND Performed at [facility], [address], [place], [place] [address]    Special Requests   Final    BONE BX RIGHT FOOT Performed at [facility], [address] [place], [place] [address]    Gram Stain   Final    NO WBC SEEN NO ORGANISMS SEEN Performed at [facility], [address], [place], [place] [address]    Culture PENDING  Incomplete   Report Status PENDING  Incomplete         Radiology Studies: DG Foot Complete Right  Result Date: 04/02/2020 CLINICAL DATA:  Transmetatarsal amputation EXAM: RIGHT FOOT COMPLETE - 3+ VIEW COMPARISON:  03/29/2020 FINDINGS: Frontal, oblique, lateral views of the right foot are obtained. Postsurgical changes from transmetatarsal  amputation through the base of the first through fifth metatarsals. Prior bony fusion within the lateral aspect of the midfoot. Likely antibiotic impregnated beads within the surgical bed. Diffuse soft tissue edema. IMPRESSION: 1. Postsurgical changes from transmetatarsal amputation right foot. Electronically Signed   By: Sharlet Salina M.D.   On: 04/02/2020 19:02        Scheduled Meds: . amLODipine  5 mg Oral Daily  . vitamin C  250 mg Oral BID  . aspirin EC  81 mg Oral Daily  . clopidogrel  75 mg Oral Daily  . gabapentin  300 mg Oral TID  . insulin aspart  0-9 Units Subcutaneous Q4H  . insulin aspart  2 Units Subcutaneous TID WC  . insulin glargine  8 Units Subcutaneous QHS  . multivitamin-lutein  1 capsule Oral Daily  . mupirocin ointment  1 application Nasal BID  . nutrition supplement (JUVEN)  1 packet Oral BID BM  . pravastatin  40 mg Oral Daily  . propranolol  40 mg Oral TID  . Ensure Max Protein  11 oz Oral Daily  . ramipril  10 mg Oral Daily   Continuous Infusions: . sodium chloride Stopped (04/03/20 0637)  . ampicillin-sulbactam (UNASYN) IV 3 g (04/04/20 0452)  . vancomycin       LOS: 6 days    Time spent: 25 minutes    Tresa Moore, MD [facility] Pager [phone]  If 7PM-7AM, please contact night-coverage 04/04/2020, 10:11 AM

## 2020-04-04 NOTE — Progress Notes (Signed)
Inpatient Diabetes Program Recommendations  AACE/ADA: New Consensus Statement on Inpatient Glycemic Control   Target Ranges:  Prepandial:   less than 140 mg/dL      Peak postprandial:   less than 180 mg/dL (1-2 hours)      Critically ill patients:  140 - 180 mg/dL   Results for Jason [name], Jason [name] (MRN [id number]) as of 04/04/2020 09:27  Ref. Range 04/03/2020 08:01 04/03/2020 11:39 04/03/2020 16:00 04/03/2020 19:56 04/03/2020 22:06 04/03/2020 23:50 04/04/2020 04:24 04/04/2020 07:22 04/04/2020 08:11  Glucose-Capillary Latest Ref Range: 70 - 99 mg/dL 94 929 (H)  Novolog 2 units 225 (H)  Novolog 3 units 287 (H)  Novolog 5 units      Lantus 5 units 264 (H)  Novolog 5 units 193 (H)  Novolog 2 units 179 (H)  192 (H)  Novolog 2 units   Review of Glycemic Control  Diabetes history: DM2 Outpatient Diabetes medications: Lantus 9 units QHS, Novolin R 5-10 units TID with meals, Amaryl 4 mg BID Current orders for Inpatient glycemic control: Lantus 5 units QHS, Novolog 0-9 units Q4H  Inpatient Diabetes Program Recommendations:    Insulin: Please consider increasing Lantus to 8 units QHS and Novolog 2 units TID with meals for meal coverage if patient eats at least 50% of meals.   Thanks, Orlando Penner, RN, MSN, CDE Diabetes Coordinator Inpatient Diabetes Program [phone] (Team Pager from 8am to 5pm)

## 2020-04-05 DIAGNOSIS — E11621 Type 2 diabetes mellitus with foot ulcer: Secondary | ICD-10-CM | POA: Diagnosis not present

## 2020-04-05 DIAGNOSIS — L97415 Non-pressure chronic ulcer of right heel and midfoot with muscle involvement without evidence of necrosis: Secondary | ICD-10-CM

## 2020-04-05 DIAGNOSIS — I739 Peripheral vascular disease, unspecified: Secondary | ICD-10-CM | POA: Diagnosis not present

## 2020-04-05 DIAGNOSIS — M86072 Acute hematogenous osteomyelitis, left ankle and foot: Secondary | ICD-10-CM | POA: Diagnosis not present

## 2020-04-05 LAB — BASIC METABOLIC PANEL
Anion gap: 5 (ref 5–15)
BUN: 24 mg/dL — ABNORMAL HIGH (ref 8–23)
CO2: 25 mmol/L (ref 22–32)
Calcium: 8.5 mg/dL — ABNORMAL LOW (ref 8.9–10.3)
Chloride: 108 mmol/L (ref 98–111)
Creatinine, Ser: 0.73 mg/dL (ref 0.61–1.24)
GFR, Estimated: >60 mL/min (ref >60)
Glucose, Bld: 179 mg/dL — ABNORMAL HIGH (ref 70–99)
Potassium: 3.3 mmol/L — ABNORMAL LOW (ref 3.5–5.1)
Sodium: 138 mmol/L (ref 135–145)

## 2020-04-05 LAB — CBC WITH DIFFERENTIAL/PLATELET
Abs Immature Granulocytes: 0.05 10*3/uL (ref 0.00–0.07)
Basophils Absolute: 0 10*3/uL (ref 0.0–0.1)
Basophils Relative: 0 %
Eosinophils Absolute: 0.3 10*3/uL (ref 0.0–0.5)
Eosinophils Relative: 4 %
HCT: 29.6 % — ABNORMAL LOW (ref 39.0–52.0)
Hemoglobin: 10 g/dL — ABNORMAL LOW (ref 13.0–17.0)
Immature Granulocytes: 1 %
Lymphocytes Relative: 19 %
Lymphs Abs: 1.3 10*3/uL (ref 0.7–4.0)
MCH: 29.2 pg (ref 26.0–34.0)
MCHC: 33.8 g/dL (ref 30.0–36.0)
MCV: 86.3 fL (ref 80.0–100.0)
Monocytes Absolute: 0.9 10*3/uL (ref 0.1–1.0)
Monocytes Relative: 14 %
Neutro Abs: 4.1 10*3/uL (ref 1.7–7.7)
Neutrophils Relative %: 62 %
Platelets: 211 10*3/uL (ref 150–400)
RBC: 3.43 MIL/uL — ABNORMAL LOW (ref 4.22–5.81)
RDW: 12.4 % (ref 11.5–15.5)
WBC: 6.6 10*3/uL (ref 4.0–10.5)
nRBC: 0 % (ref 0.0–0.2)

## 2020-04-05 LAB — GLUCOSE, CAPILLARY
Glucose-Capillary: 172 mg/dL — ABNORMAL HIGH (ref 70–99)
Glucose-Capillary: 179 mg/dL — ABNORMAL HIGH (ref 70–99)
Glucose-Capillary: 202 mg/dL — ABNORMAL HIGH (ref 70–99)
Glucose-Capillary: 225 mg/dL — ABNORMAL HIGH (ref 70–99)

## 2020-04-05 LAB — MAGNESIUM: Magnesium: 1.7 mg/dL (ref 1.7–2.4)

## 2020-04-05 MED ORDER — POTASSIUM CHLORIDE CRYS ER 20 MEQ PO TBCR
40.0000 meq | EXTENDED_RELEASE_TABLET | Freq: Once | ORAL | Status: AC
  Administered 2020-04-05: 40 meq via ORAL
  Filled 2020-04-05: qty 2

## 2020-04-05 MED ORDER — GABAPENTIN 300 MG PO CAPS: 300.0000 mg | ORAL_CAPSULE | Freq: Three times a day (TID) | ORAL | 0 refills | Status: DC

## 2020-04-05 MED ORDER — AMOXICILLIN-POT CLAVULANATE 875-125 MG PO TABS: 1.0000 | ORAL_TABLET | Freq: Two times a day (BID) | ORAL | 0 refills | Status: AC

## 2020-04-05 MED ORDER — MAGNESIUM SULFATE 2 GM/50ML IV SOLN
2.0000 g | Freq: Once | INTRAVENOUS | Status: AC
  Administered 2020-04-05: 2 g via INTRAVENOUS
  Filled 2020-04-05: qty 50

## 2020-04-05 NOTE — TOC Transition Note (Signed)
Transition of Care [facility]) - CM/SW Discharge Note   Patient Details  Name: Jason [name] MRN: [id number] Date of Birth: 1945/11/29  [facility] CM/SW Contact:  Maud Deed, LCSW Phone Number: 04/05/2020, 2:30 PM   Clinical Narrative:    Pt medially stable for discharge per MD. Pt will be transported home by his daughter. HH with [facility] has been arranged. CSW delivered RW through [employer].     Final next level of care: Home w Home Health Services Barriers to Discharge: No Barriers Identified   Patient Goals and CMS Choice     Choice offered to / list presented to : NA  Discharge Placement                Patient to be transferred to facility by: Daughter Name of family member notified: Sue Lush Patient and family notified of of transfer: 04/05/20  Discharge Plan and Services                DME Arranged: Dan Humphreys rolling DME Agency: [employer] Date DME Agency Contacted: 04/05/20 Time DME Agency Contacted: [phone]   HH Arranged: PT,OT,RN,Nurse's Aide HH Agency: [facility] Date [facility] Agency Contacted: 04/04/20 Time HH Agency Contacted: [phone] Representative spoke with at [facility] Agency: Kandee Keen  Social Determinants of Health (SDOH) Interventions     Readmission Risk Interventions No flowsheet data found.

## 2020-04-05 NOTE — Plan of Care (Signed)
  Problem: Education: Goal: Knowledge of General Education information will improve Description: Including pain rating scale, medication(s)/side effects and non-pharmacologic comfort measures Outcome: Adequate for Discharge   Problem: Health Behavior/Discharge Planning: Goal: Ability to manage health-related needs will improve Outcome: Adequate for Discharge   Problem: Clinical Measurements: Goal: Ability to maintain clinical measurements within normal limits will improve Outcome: Adequate for Discharge Goal: Will remain free from infection Outcome: Adequate for Discharge Goal: Diagnostic test results will improve Outcome: Adequate for Discharge Goal: Respiratory complications will improve Outcome: Adequate for Discharge Goal: Cardiovascular complication will be avoided Outcome: Adequate for Discharge   Problem: Activity: Goal: Risk for activity intolerance will decrease Outcome: Adequate for Discharge   Problem: Nutrition: Goal: Adequate nutrition will be maintained Outcome: Adequate for Discharge   Problem: Coping: Goal: Level of anxiety will decrease Outcome: Adequate for Discharge   Problem: Elimination: Goal: Will not experience complications related to bowel motility Outcome: Adequate for Discharge Goal: Will not experience complications related to urinary retention Outcome: Adequate for Discharge   Problem: Pain Managment: Goal: General experience of comfort will improve Outcome: Adequate for Discharge   Problem: Safety: Goal: Ability to remain free from injury will improve Outcome: Adequate for Discharge   Problem: Skin Integrity: Goal: Risk for impaired skin integrity will decrease Outcome: Adequate for Discharge   Problem: Increased Nutrient Needs (NI-5.1) Goal: Food and/or nutrient delivery Description: Individualized approach for food/nutrient provision. Outcome: Adequate for Discharge   Problem: Acute Rehab OT Goals (only OT should resolve) Goal:  Pt. Will Perform Lower Body Dressing Outcome: Adequate for Discharge Goal: Pt. Will Transfer To Toilet Outcome: Adequate for Discharge Goal: Pt. Will Perform Toileting-Clothing Manipulation Outcome: Adequate for Discharge Goal: Pt/Caregiver Will Perform Home Exercise Program Outcome: Adequate for Discharge   Problem: Acute Rehab PT Goals(only PT should resolve) Goal: Patient Will Perform Sitting Balance Outcome: Adequate for Discharge Goal: Patient Will Transfer Sit To/From Stand Outcome: Adequate for Discharge Goal: Pt Will Perform Standing Balance Or Pre-Gait Outcome: Adequate for Discharge Goal: Pt Will Ambulate Outcome: Adequate for Discharge

## 2020-04-05 NOTE — Discharge Summary (Signed)
Physician Discharge Summary  Jason [name] ZOX:[id number] DOB: [birth year] DOA: 03/29/2020  PCP: Marina Goodell, MD  Admit date: 03/29/2020 Discharge date: 04/05/2020  Admitted From: Home Disposition: Home with home health  Recommendations for Outpatient Follow-up:  1. Follow up with PCP in 1-2 weeks 2. Follow-up podiatry in 1 week 3. Follow-up vascular surgery 1 month  Home Health: Yes Equipment/Devices: No Discharge Condition: Stable CODE STATUS: Full Diet recommendation: Heart Healthy / Carb Modified  Brief/Interim Summary: 75 y.o.malewith medical history significant of CAD, DM, HTN, HDL, PVD, tremor, CVAocclusion of basilar artery  Presented with acute on chronic wound in the right foot.  Was bleeding for a few days prior to presentation.  Had x-rays done as an outpatient was started on ciprofloxacin and Augmentin by his podiatrist.  Was admitted for ABIs and found to have significant peripheral arterial disease.  Now status post 3 angiographies revascularization.  MRI imaging survey concerning for diffuse osteomyelitis including remnants of fifth metatarsal base fourth and third metatarsal  Podiatry on consult.  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  Patient tolerated procedure well but is having some postoperative pain.  Postoperative pain control improved after introduction of gabapentin.  Patient in good spirits.  Surgical site looks clean.  Per ID high likelihood of being able to transition to oral antibiotics.  Pending final surgical pathology.  Plan of care discussed with podiatry on day of discharge.  Patient will not need regular wound care.  He will need to follow-up in podiatry office in 1 week post discharge for wound check and dressing change.  In case there is some delay home health RN has been ordered with instructions for wound care if dressings become saturated or there is delay in podiatry follow-up.  Per infectious  disease wound looks good and cultures reassuring.  Will discharge on oral Augmentin for 10 additional days post discharge.  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 Augmentin at time of discharge Prescribe additional 10 days Home health services ordered Follow-up podiatry office 1 week for wound check and dressing change  Type 2 diabetes mellitus with hyperglycemia and hyperlipidemia Plan: Can resume home regimen on discharge Follow-up outpatient PCP  Peripheral vascular disease Status post angioplasty x3 Dual antiplatelet therapy restarted 3/24  Essential hypertension Well-controlled on current regimen Continue home regimen on discharge  Discharge Instructions  Discharge Instructions    Diet - low sodium heart healthy   Complete by: As directed    Discharge wound care:   Complete by: As directed    If there is saturation or loosening then they can change it PRN or if it is longer than a week before he gets in then they should remove the dressing, cleanse leg with normal saline and dry, apply xeroform to incisions, 4x4, abd, kerlix, and ace wrap  from forefoot to below knee with mild compression   Increase activity slowly   Complete  by: As directed      Allergies as of 04/05/2020   No Known Allergies     Medication List    TAKE these medications   amoxicillin-clavulanate 875-125 MG tablet Commonly known as: Augmentin Take 1 tablet by mouth 2 (two) times daily for 10 days.   aspirin 81 MG tablet Take 81 mg by mouth daily.   clopidogrel 75 MG tablet Commonly known as: PLAVIX Take 75 mg by mouth daily.   docusate sodium 100 MG capsule Commonly known as: COLACE Take 1 capsule (100 mg total) by mouth 2 (two) times daily as needed for mild constipation.   gabapentin 300 MG capsule Commonly known as: NEURONTIN Take 1 capsule (300 mg total) by mouth 3 (three) times daily for 14 days.   glimepiride 4 MG tablet Commonly known as: AMARYL Take 4 mg by mouth 2 (two) times daily.   insulin glargine 100 UNIT/ML injection Commonly known as: LANTUS Inject 9 Units into the skin at bedtime.   insulin regular 100 units/mL injection Commonly known as: NOVOLIN R Inject 5-10 Units into the skin 3 (three) times daily before meals.   Magnesium 250 MG Tabs Take by mouth.   metFORMIN 1000 MG tablet Commonly known as: GLUCOPHAGE Take 1,000 mg by mouth 2 (two) times daily with a meal.   multivitamin capsule Take 1 capsule by mouth daily.   oxyCODONE-acetaminophen 5-325 MG tablet Commonly known as: PERCOCET/ROXICET Take 1 tablet by mouth every 6 (six) hours as needed for severe pain.   povidone-iodine 10 % external solution Commonly known as: Betadine To apply and clean the wound 3 times a week.   pravastatin 40 MG tablet Commonly known as: PRAVACHOL Take 40 mg by mouth daily.   propranolol 40 MG tablet Commonly known as: INDERAL Take 1 tablet (40 mg total) by mouth 3 (three) times daily.   ramipril 10 MG capsule Commonly known as: ALTACE Take 10 mg by mouth daily.            Durable Medical Equipment  (From admission, onward)         Start     Ordered   04/04/20 0728  For home use only DME  Dan Humphreys  Once       Question:  Patient needs a walker to treat with the following condition  Answer:  Weakness   04/04/20 0727           Discharge Care Instructions  (From admission, onward)         Start     Ordered   04/05/20 0000  Discharge wound care:       Comments: If there is saturation or loosening then they can change it PRN or if it is longer than a week before he gets in then they should remove the dressing, cleanse leg with normal saline and dry, apply xeroform to incisions, 4x4, abd, kerlix, and ace wrap from forefoot to below knee with mild compression   04/05/20 6010          Follow-up Information    Annice Needy, MD Follow up in 1 month(s).   Specialties: Vascular Surgery, Radiology, Interventional Cardiology Why: Can see Dew or Vivia Birmingham.  Will need ABI with visit. Contact information: [address] [place] [address] [phone]              No Known Allergies  Consultations:  Podiatry  Infectious disease  Vascular surgery   Procedures/Studies: MR FOOT RIGHT WO CONTRAST  Result Date: 03/30/2020 CLINICAL DATA:  Chronic right foot wound.  Pain.  Diabetic patient. EXAM: MRI OF THE RIGHT FOREFOOT WITHOUT CONTRAST TECHNIQUE: Multiplanar, multisequence MR imaging of the right forefoot was performed. No intravenous contrast was administered. COMPARISON:  Plain films right foot 03/16/2015. FINDINGS: Bones/Joint/Cartilage The patient has undergone amputation at the level of the proximal and middle thirds of the diaphysis of the fifth metatarsal since the prior exam. There is marrow edema throughout the fourth metatarsal, fourth toe, third metatarsal and majority of the proximal phalanx of the third toe. The middle phalanx of the third toe is spared but there is edema in the distal phalanx of the third toe. Edema is also seen in the cuboid and in the proximal 2.4 cm of the fifth metatarsal remnant. There is osseous fusion across the cuboid and fourth and  fifth metatarsals. Bone marrow signal is otherwise normal. Midfoot neuropathic change noted Ligaments Grossly intact. Muscles and Tendons Severe fatty atrophy of intrinsic musculature the foot. Soft tissues Skin wound is seen along the lateral aspect of the patient's stump. Scattered locules of soft tissue gas predominant about the lateral aspect of the foot are noted. There may also be a skin wound on the plantar surface of the foot deep to the distal fourth metatarsal. No abscess is identified. IMPRESSION: 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 Negative for abscess or osteomyelitis with skin wounds and small foci of soft tissue gas in the lateral aspect of the foot noted. Electronically Signed   By: Drusilla Kanner M.D.   On: 03/30/2020 09:17   PERIPHERAL VASCULAR CATHETERIZATION  Result Date: 03/31/2020 See op note  US ARTERIAL LOWER EXTREMITY DUPLEX RIGHT(NON-ABI)  Result Date: 03/30/2020 CLINICAL DATA:  Diabetic foot ulcer. Previous toe amputations. Hypertension, hyperlipidemia, coronary artery disease, diabetes EXAM: RIGHT LOWER EXTREMITY ARTERIAL DUPLEX SCAN TECHNIQUE: Gray-scale sonography as well as color Doppler and duplex ultrasound was performed to evaluate the lower extremity arteries including the common, superficial and profunda femoral arteries, popliteal artery and calf arteries. COMPARISON:  None. FINDINGS: Right lower Extremity ABI: Not calculated Inflow: Normal common femoral arterial waveforms and velocities. No evidence of inflow (aortoiliac) disease. Scattered nonocclusive plaque. Outflow: Normal profunda femoral, and proximal superficial femoral arterial waveforms and velocities. Monophasic waveforms in the distal SFA and popliteal artery. No focal elevation of the PSV to suggest stenosis.  Scattered nonocclusive plaque. Runoff: Occlusion in the proximal/mid posterior tibial artery, reconstituted distally with low velocity monophasic waveform. Patent anterior tibial artery with monophasic waveform. IMPRESSION: Occlusion in the right posterior tibial artery, reconstituted distally. Correlate with ABIs. Electronically Signed   By: Corlis Leak M.D.   On: 03/30/2020 08:31   DG Foot Complete Right  Result Date: 04/02/2020 CLINICAL DATA:  Transmetatarsal amputation EXAM: RIGHT FOOT COMPLETE - 3+ VIEW COMPARISON:  03/29/2020 FINDINGS: Frontal, oblique, lateral views of the right foot are obtained. Postsurgical changes from transmetatarsal amputation through the base of the first through fifth metatarsals. Prior bony fusion within the lateral aspect of the midfoot. Likely antibiotic impregnated beads within the surgical bed. Diffuse soft tissue edema. IMPRESSION: 1. Postsurgical changes from transmetatarsal amputation right foot. Electronically Signed   By: Sharlet Salina M.D.   On: 04/02/2020 19:02    (Echo, Carotid,  EGD, Colonoscopy, ERCP)    Subjective: Patient seen and examined on the day of discharge.  Stable, no distress.  Pain well controlled.  No fevers.  No white count.  Stable for discharge home at this time.  Discharge Exam: Vitals:   04/05/20 0759 04/05/20 0900  BP: (!) 144/65 (!) 122/58  Pulse: (!) 54 (!) 53  Resp: 14 20  Temp: 97.7 F (36.5 C) 98.1 F (36.7 C)  SpO2: 96% 95%   Vitals:   04/05/20 0029 04/05/20 0435 04/05/20 0759 04/05/20 0900  BP: 133/60 136/63 (!) 144/65 (!) 122/58  Pulse: (!) 57 (!) 53 (!) 54 (!) 53  Resp: Temp: 98.2 F (36.8 C) 98.3 F (36.8 C) 97.7 F (36.5 C) 98.1 F (36.7 C)  TempSrc: Oral Oral  Oral  SpO2: 96% 97% 96% 95%  Weight:      Height:        General: Pt is alert, awake, not in acute distress Cardiovascular: RRR, S1/S2 +, no rubs, no gallops Respiratory: CTA bilaterally, no wheezing, no rhonchi Abdominal: Obese,  soft, nontender, nondistended, positive bowel sounds Extremities: Status post right-sided transmetatarsal amputation.  Multiple toe imitations on left.    The results of significant diagnostics from this hospitalization (including imaging, microbiology, ancillary and laboratory) are listed below for reference.     Microbiology: Recent Results (from the past 240 hour(s))  SARS CORONAVIRUS 2 (TAT 6-24 HRS) Nasopharyngeal Nasopharyngeal Swab     Status: None   Collection Time: 03/29/20  7:41 PM   Specimen: Nasopharyngeal Swab  Result Value Ref Range Status   SARS Coronavirus 2 NEGATIVE NEGATIVE Final    Comment: (NOTE) SARS-CoV-2 target nucleic acids are NOT DETECTED.  The SARS-CoV-2 RNA is generally detectable in upper and lower respiratory specimens during the acute phase of infection. Negative results do not preclude SARS-CoV-2 infection, do not rule out co-infections with other pathogens, and should not be used as the sole basis for treatment or other patient management decisions. Negative results must be combined with clinical observations, patient history, and epidemiological information. The expected result is Negative.  Fact Sheet for Patients: HairSlick.no  Fact Sheet for Healthcare Providers: quierodirigir.com  This test is not yet approved or cleared by the [place] FDA and  has been authorized for detection and/or diagnosis of SARS-CoV-2 by FDA under an Emergency Use Authorization (EUA). This EUA will remain  in effect (meaning this test can be used) for the duration of the COVID-19 declaration under Se ction 564(b)(1) of the Act, 21 U.S.C. section 360bbb-3(b)(1), unless the authorization is terminated or revoked sooner.  Performed at [facility], [address] [place], [place] [address]   Blood culture (routine x 2)     Status: None   Collection Time: 03/29/20  7:41 PM   Specimen: BLOOD  Result  Value Ref Range Status   Specimen Description BLOOD LEFT ANTECUBITAL  Final   Special Requests   Final    BOTTLES DRAWN AEROBIC AND ANAEROBIC Blood Culture results may not be optimal due to an inadequate volume of blood received in culture bottles   Culture   Final    NO GROWTH 5 DAYS Performed at [facility], [address] [place], [place] [address]    Report Status 04/03/2020 FINAL  Final  Blood culture (routine x 2)     Status: None   Collection Time: 03/29/20  7:41 PM   Specimen: BLOOD  Result Value Ref Range Status  Specimen Description BLOOD BLOOD RIGHT FOREARM  Final   Special Requests   Final    BOTTLES DRAWN AEROBIC AND ANAEROBIC Blood Culture adequate volume   Culture   Final    NO GROWTH 5 DAYS Performed at [facility], [address] [place], [place] [address]    Report Status 04/03/2020 FINAL  Final  Aerobic Culture w Gram Stain (superficial specimen)     Status: None   Collection Time: 04/01/20  8:32 PM   Specimen: Wound  Result Value Ref Range Status   Specimen Description   Final    WOUND Performed at [facility], [address] [place], [place] [address]    Special Requests   Final    NONE Performed at [facility], [address], [place], [place] [address]    Gram Stain   Final    RARE WBC PRESENT, PREDOMINANTLY PMN RARE GRAM POSITIVE COCCI Performed at [facility], [address] [place], [place] [address]    Culture RARE STAPHYLOCOCCUS AUREUS  Final   Report Status 04/04/2020 FINAL  Final   Organism ID, Bacteria STAPHYLOCOCCUS AUREUS  Final      Susceptibility   Staphylococcus aureus - MIC*    CIPROFLOXACIN <=0.5 SENSITIVE Sensitive     ERYTHROMYCIN 4 INTERMEDIATE Intermediate     GENTAMICIN <=0.5 SENSITIVE Sensitive     OXACILLIN 0.5 SENSITIVE Sensitive     TETRACYCLINE <=1 SENSITIVE Sensitive     VANCOMYCIN <=0.5 SENSITIVE Sensitive     TRIMETH/SULFA <=10 SENSITIVE Sensitive      CLINDAMYCIN <=0.25 SENSITIVE Sensitive     RIFAMPIN <=0.5 SENSITIVE Sensitive     Inducible Clindamycin NEGATIVE Sensitive     * RARE STAPHYLOCOCCUS AUREUS  Surgical PCR screen     Status: None   Collection Time: 04/02/20  3:30 AM   Specimen: Nasal Mucosa; Nasal Swab  Result Value Ref Range Status   MRSA, PCR NEGATIVE NEGATIVE Final   Staphylococcus aureus NEGATIVE NEGATIVE Final    Comment: (NOTE) The Xpert SA Assay (FDA approved for NASAL specimens in patients 106 years of age and older), is one component of a comprehensive surveillance program. It is not intended to diagnose infection nor to guide or monitor treatment. Performed at [facility], [address] [place], [place] [address]   Aerobic/Anaerobic Culture w Gram Stain (surgical/deep wound)     Status: None (Preliminary result)   Collection Time: 04/02/20  5:13 PM   Specimen: PATH Bone biopsy; Tissue  Result Value Ref Range Status   Specimen Description   Final    WOUND Performed at [facility], [address] [place], [place] [address]    Special Requests   Final    BONE BX RIGHT FOOT Performed at [facility], [address] [place], [place] [address]    Gram Stain NO WBC SEEN NO ORGANISMS SEEN   Final   Culture   Final    RARE STAPHYLOCOCCUS AUREUS SUSCEPTIBILITIES TO FOLLOW Performed at [facility], [address], [place], [place] [address]    Report Status PENDING  Incomplete     Labs: BNP (last 3 results) No results for input(s): BNP in the last 8760 hours. Basic Metabolic Panel: Recent Labs  Lab 03/30/20 0515 03/31/20 0724 04/01/20 0538 04/02/20 0547 04/03/20 0128 04/04/20 0538 04/05/20 0448  NA 137  --  138  --  138 136 138  K 3.7  --  3.7  --  3.5 3.5 3.3*  CL 107  --  110  --  109 106 108  CO2 23  --  23  --  24 25 25   GLUCOSE 197*  --  174*  --  195* 200* 179*  BUN 17  --  17  --  15 25* 24*  CREATININE 0.73 0.87 0.82 0.83 0.84 0.83 0.73   CALCIUM 9.0  --  8.8*  --  8.7* 8.8* 8.5*  MG 1.5* 1.8  --   --  1.7 1.9 1.7  PHOS 2.9  --   --   --   --   --   --    Liver Function Tests: Recent Labs  Lab 03/29/20 1616 03/30/20 0515  AST 22 19  ALT 22 19  ALKPHOS 79 75  BILITOT 0.7 0.7  PROT 7.6 6.8  ALBUMIN 3.3* 2.9*   No results for input(s): LIPASE, AMYLASE in the last 168 hours. No results for input(s): AMMONIA in the last 168 hours. CBC: Recent Labs  Lab 03/29/20 1616 03/30/20 0515 04/03/20 0128 04/04/20 0538 04/05/20 0448  WBC 6.2 4.9 8.2 7.5 6.6  NEUTROABS 4.0 3.0 5.9 4.8 4.1  HGB 11.8* 11.6* 11.1* 10.7* 10.0*  HCT 35.3* 32.7* 33.7* 31.9* 29.6*  MCV 86.7 84.7 86.4 86.4 86.3  PLT 286 259 236 241 211   Cardiac Enzymes: No results for input(s): CKTOTAL, CKMB, CKMBINDEX, TROPONINI in the last 168 hours. BNP: Invalid input(s): POCBNP CBG: Recent Labs  Lab 04/04/20 1554 04/04/20 1956 04/05/20 0015 04/05/20 0437 04/05/20 0758  GLUCAP 213* 176* 225* 179* 172*   D-Dimer No results for input(s): DDIMER in the last 72 hours. Hgb A1c No results for input(s): HGBA1C in the last 72 hours. Lipid Profile No results for input(s): CHOL, HDL, LDLCALC, TRIG, CHOLHDL, LDLDIRECT in the last 72 hours. Thyroid function studies No results for input(s): TSH, T4TOTAL, T3FREE, THYROIDAB in the last 72 hours.  Invalid input(s): FREET3 Anemia work up No results for input(s): VITAMINB12, FOLATE, FERRITIN, TIBC, IRON, RETICCTPCT in the last 72 hours. Urinalysis No results found for: COLORURINE, APPEARANCEUR, LABSPEC, PHURINE, GLUCOSEU, HGBUR, BILIRUBINUR, KETONESUR, PROTEINUR, UROBILINOGEN, NITRITE, LEUKOCYTESUR Sepsis Labs Invalid input(s): PROCALCITONIN,  WBC,  LACTICIDVEN Microbiology Recent Results (from the past 240 hour(s))  SARS CORONAVIRUS 2 (TAT 6-24 HRS) Nasopharyngeal Nasopharyngeal Swab     Status: None   Collection Time: 03/29/20  7:41 PM   Specimen: Nasopharyngeal Swab  Result Value Ref Range Status    SARS Coronavirus 2 NEGATIVE NEGATIVE Final    Comment: (NOTE) SARS-CoV-2 target nucleic acids are NOT DETECTED.  The SARS-CoV-2 RNA is generally detectable in upper and lower respiratory specimens during the acute phase of infection. Negative results do not preclude SARS-CoV-2 infection, do not rule out co-infections with other pathogens, and should not be used as the sole basis for treatment or other patient management decisions. Negative results must be combined with clinical observations, patient history, and epidemiological information. The expected result is Negative.  Fact Sheet for Patients: HairSlick.nohttps://www.fda.gov/media/138098/download  Fact Sheet for Healthcare Providers: quierodirigir.comhttps://www.fda.gov/media/138095/download  This test is not yet approved or cleared by the [place] FDA and  has been authorized for detection and/or diagnosis of SARS-CoV-2 by FDA under an Emergency Use Authorization (EUA). This EUA will remain  in effect (meaning this test can be used) for the duration of the COVID-19 declaration under Se ction 564(b)(1) of the Act, 21 U.S.C. section 360bbb-3(b)(1), unless the authorization is terminated or revoked sooner.  Performed at [facility], [address], [place], [place] [address]   Blood culture (routine x 2)     Status: None   Collection Time: 03/29/20  7:41 PM   Specimen: BLOOD  Result Value Ref Range Status   Specimen Description BLOOD LEFT ANTECUBITAL  Final   Special Requests   Final    BOTTLES DRAWN AEROBIC AND ANAEROBIC Blood Culture results may not be optimal due to an inadequate volume of blood received in culture bottles   Culture   Final    NO GROWTH 5 DAYS Performed at [facility], [address], [place], [place] [address]    Report Status 04/03/2020 FINAL  Final  Blood culture (routine x 2)     Status: None   Collection Time: 03/29/20  7:41 PM   Specimen: BLOOD  Result Value Ref Range Status   Specimen Description  BLOOD BLOOD RIGHT FOREARM  Final   Special Requests   Final    BOTTLES DRAWN AEROBIC AND ANAEROBIC Blood Culture adequate volume   Culture   Final    NO GROWTH 5 DAYS Performed at [facility], [address], [place], [place] [address]    Report Status 04/03/2020 FINAL  Final  Aerobic Culture w Gram Stain (superficial specimen)     Status: None   Collection Time: 04/01/20  8:32 PM   Specimen: Wound  Result Value Ref Range Status   Specimen Description   Final    WOUND Performed at [facility], [address], [place], [place] [address]    Special Requests   Final    NONE Performed at [facility], [address] [place], [place] [address]    Gram Stain   Final    RARE WBC PRESENT, PREDOMINANTLY PMN RARE GRAM POSITIVE COCCI Performed at [facility], [address], [place], [place] [address]    Culture RARE STAPHYLOCOCCUS AUREUS  Final   Report Status 04/04/2020 FINAL  Final   Organism ID, Bacteria STAPHYLOCOCCUS AUREUS  Final      Susceptibility   Staphylococcus aureus - MIC*    CIPROFLOXACIN <=0.5 SENSITIVE Sensitive     ERYTHROMYCIN 4 INTERMEDIATE Intermediate     GENTAMICIN <=0.5 SENSITIVE Sensitive     OXACILLIN 0.5 SENSITIVE Sensitive     TETRACYCLINE <=1 SENSITIVE Sensitive     VANCOMYCIN <=0.5 SENSITIVE Sensitive     TRIMETH/SULFA <=10 SENSITIVE Sensitive     CLINDAMYCIN <=0.25 SENSITIVE Sensitive     RIFAMPIN <=0.5 SENSITIVE Sensitive     Inducible Clindamycin NEGATIVE Sensitive     * RARE STAPHYLOCOCCUS AUREUS  Surgical PCR screen     Status: None   Collection Time: 04/02/20  3:30 AM   Specimen: Nasal Mucosa; Nasal Swab  Result Value Ref Range Status   MRSA, PCR NEGATIVE NEGATIVE Final   Staphylococcus aureus NEGATIVE NEGATIVE Final    Comment: (NOTE) The Xpert SA Assay (FDA approved for NASAL specimens in patients 74 years of age and older), is one component of a comprehensive surveillance program. It is not intended  to diagnose infection nor to guide or monitor treatment. Performed at [facility], [address] [place], [place] [address]   Aerobic/Anaerobic Culture w Gram Stain (surgical/deep wound)     Status: None (Preliminary result)   Collection Time: 04/02/20  5:13 PM   Specimen: PATH Bone biopsy; Tissue  Result Value Ref Range Status   Specimen Description   Final    WOUND Performed at [facility], [address] [place], [place] [address]    Special Requests   Final    BONE BX RIGHT FOOT Performed at [facility], [address] [place], [place] [address]    Gram Stain NO WBC SEEN NO ORGANISMS SEEN   Final   Culture   Final    RARE STAPHYLOCOCCUS AUREUS SUSCEPTIBILITIES TO FOLLOW Performed at [facility] [facility], [address], [place], [place] [address]    Report Status PENDING  Incomplete     Time coordinating discharge: Over 30 minutes  SIGNED:   Tresa Moore, MD  [facility] 04/05/2020, 10:27 AM Pager   If 7PM-7AM, please contact night-coverage

## 2020-04-08 LAB — AEROBIC/ANAEROBIC CULTURE W GRAM STAIN (SURGICAL/DEEP WOUND): Gram Stain: NONE SEEN

## 2020-05-05 ENCOUNTER — Emergency Department
Admission: EM | Admit: 2020-05-05 | Discharge: 2020-05-05 | Disposition: A | Discharge status: Home / Self Care | Payer: Medicare HMO | Attending: Emergency Medicine | Admitting: Emergency Medicine

## 2020-05-05 ENCOUNTER — Encounter: Payer: Self-pay | Admitting: Medical Oncology

## 2020-05-05 ENCOUNTER — Other Ambulatory Visit: Payer: Self-pay

## 2020-05-05 DIAGNOSIS — Z7982 Long term (current) use of aspirin: Secondary | ICD-10-CM | POA: Insufficient documentation

## 2020-05-05 DIAGNOSIS — Z7902 Long term (current) use of antithrombotics/antiplatelets: Secondary | ICD-10-CM | POA: Diagnosis not present

## 2020-05-05 DIAGNOSIS — W260XXA Contact with knife, initial encounter: Secondary | ICD-10-CM | POA: Insufficient documentation

## 2020-05-05 DIAGNOSIS — Z951 Presence of aortocoronary bypass graft: Secondary | ICD-10-CM | POA: Diagnosis not present

## 2020-05-05 DIAGNOSIS — L97419 Non-pressure chronic ulcer of right heel and midfoot with unspecified severity: Secondary | ICD-10-CM | POA: Insufficient documentation

## 2020-05-05 DIAGNOSIS — Z794 Long term (current) use of insulin: Secondary | ICD-10-CM | POA: Diagnosis not present

## 2020-05-05 DIAGNOSIS — Z7984 Long term (current) use of oral hypoglycemic drugs: Secondary | ICD-10-CM | POA: Insufficient documentation

## 2020-05-05 DIAGNOSIS — Z79899 Other long term (current) drug therapy: Secondary | ICD-10-CM | POA: Insufficient documentation

## 2020-05-05 DIAGNOSIS — I10 Essential (primary) hypertension: Secondary | ICD-10-CM | POA: Insufficient documentation

## 2020-05-05 DIAGNOSIS — S6992XA Unspecified injury of left wrist, hand and finger(s), initial encounter: Secondary | ICD-10-CM | POA: Diagnosis present

## 2020-05-05 DIAGNOSIS — E785 Hyperlipidemia, unspecified: Secondary | ICD-10-CM | POA: Diagnosis not present

## 2020-05-05 DIAGNOSIS — Z89431 Acquired absence of right foot: Secondary | ICD-10-CM | POA: Diagnosis not present

## 2020-05-05 DIAGNOSIS — S61211A Laceration without foreign body of left index finger without damage to nail, initial encounter: Secondary | ICD-10-CM | POA: Diagnosis not present

## 2020-05-05 DIAGNOSIS — Z89412 Acquired absence of left great toe: Secondary | ICD-10-CM | POA: Diagnosis not present

## 2020-05-05 DIAGNOSIS — E11621 Type 2 diabetes mellitus with foot ulcer: Secondary | ICD-10-CM | POA: Insufficient documentation

## 2020-05-05 DIAGNOSIS — R58 Hemorrhage, not elsewhere classified: Secondary | ICD-10-CM | POA: Diagnosis not present

## 2020-05-05 DIAGNOSIS — E1169 Type 2 diabetes mellitus with other specified complication: Secondary | ICD-10-CM | POA: Insufficient documentation

## 2020-05-05 DIAGNOSIS — I2510 Atherosclerotic heart disease of native coronary artery without angina pectoris: Secondary | ICD-10-CM | POA: Insufficient documentation

## 2020-05-05 DIAGNOSIS — E1142 Type 2 diabetes mellitus with diabetic polyneuropathy: Secondary | ICD-10-CM | POA: Diagnosis not present

## 2020-05-05 MED ORDER — LIDOCAINE HCL (PF) 1 % IJ SOLN
5.0000 mL | Freq: Once | INTRAMUSCULAR | Status: AC
  Administered 2020-05-05: 5 mL
  Filled 2020-05-05: qty 5

## 2020-05-05 MED ORDER — TETANUS-DIPHTH-ACELL PERTUSSIS 5-2.5-18.5 LF-MCG/0.5 IM SUSY: 0.5000 mL | PREFILLED_SYRINGE | Freq: Once | INTRAMUSCULAR | Status: DC

## 2020-05-05 NOTE — ED Notes (Signed)
D/C instructions given.  Advised of follow up and wound care.  All questions addressed.  Understanding verbalized.  Pt left ER via w/c with family.

## 2020-05-05 NOTE — ED Triage Notes (Signed)
Pt reports that he was using his knife and accidentally cut his left index finer. Pt is on blood thinner. Bleeding controlled by ems.

## 2020-05-05 NOTE — Discharge Instructions (Addendum)
Keep the wound clean, dry, and covered.  See your primary provider in 7 to 10 days for suture removal. 

## 2020-05-05 NOTE — ED Provider Notes (Signed)
[facility] Emergency Department Provider Note ____________________________________________  Time seen: 1251  I have reviewed the triage vital signs and the nursing notes.  HISTORY  Chief Complaint  Laceration   HPI Jason [name] is a 75 y.o. male the below medical history, presents to the ED, via EMS, for evaluation of accidental laceration to left index finger.  Patient was reportedly  using his knife when he accidentally cut his left index finger.  He denies any other injury at this time.  Patient is currently on a blood thinner.  He denies any active bleeding at this time.  He is unclear of his current tetanus status.  Past Medical History:  Diagnosis Date  . CAD (coronary artery disease)   . Colon polyp   . Diabetes mellitus without complication (HCC)   . Hyperlipemia   . Hypertension   . Orchitis   . PVD (peripheral vascular disease) (HCC)   . Tremor     Patient Active Problem List   Diagnosis Date Noted  . Pyogenic inflammation of bone (HCC)   . Diabetic ulcer of right midfoot associated with type 2 diabetes mellitus, with muscle involvement without evidence of necrosis (HCC)   . PVD (peripheral vascular disease) (HCC)   . Cellulitis 03/29/2020  . History of CVA (cerebrovascular accident) 03/29/2020  . CAD (coronary artery disease) 03/29/2020  . Essential hypertension 03/29/2020  . Type 2 diabetes mellitus with hyperlipidemia (HCC) 03/29/2020  . 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 Cellulitis in diabetic foot (HCC) 08/20/2016    Past Surgical History:  Procedure Laterality Date  . ACHILLES TENDON SURGERY Right 04/02/2020   Procedure: ACHILLES LENGTHENING;  Surgeon: Rosetta Posner, DPM;  Location: [facility] ORS;  Service: Podiatry;  Laterality: Right;  . AMPUTATION Right 08/21/2016   Procedure: RAY RESECTION-RIGHT 5TH RAY;  Surgeon: Linus Galas, DPM;  Location: [facility] ORS;  Service: Podiatry;  Laterality: Right;  . AMPUTATION TOE Left    big  . CAROTID  ENDARTERECTOMY Right   . COLON SURGERY    . CORONARY ARTERY BYPASS GRAFT    . LOWER EXTREMITY ANGIOGRAPHY Right 03/31/2020   Procedure: Lower Extremity Angiography;  Surgeon: Annice Needy, MD;  Location: [facility];  Service: Cardiovascular;  Laterality: Right;  . ROTATOR CUFF REPAIR Right   . 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 Authorizing Provider  aspirin 81 MG tablet Take 81 mg by mouth daily.    [provider]  clopidogrel (PLAVIX) 75 MG tablet Take 75 mg by mouth daily.    [provider]  docusate sodium (COLACE) 100 MG capsule Take 1 capsule (100 mg total) by mouth 2 (two) times daily as needed for mild constipation. 08/24/16   Altamese Dilling, MD  gabapentin (NEURONTIN) 300 MG capsule Take 1 capsule (300 mg total) by mouth 3 (three) times daily for 14 days. 04/05/20 04/19/20  Tresa Moore, MD  glimepiride (AMARYL) 4 MG tablet Take 4 mg by mouth 2 (two) times daily.     [provider]  insulin glargine (LANTUS) 100 UNIT/ML injection Inject 9 Units into the skin at bedtime.     [provider]  insulin regular (NOVOLIN R,HUMULIN R) 100 units/mL injection Inject 5-10 Units into the skin 3 (three) times daily before meals.     [provider]  Magnesium 250 MG TABS Take by mouth.    [provider]  metFORMIN (GLUCOPHAGE) 1000 MG tablet Take 1,000 mg by mouth 2 (two) times daily with a meal.    [provider]  Multiple Vitamin (MULTIVITAMIN) capsule Take 1 capsule by mouth daily.    [provider]  oxyCODONE-acetaminophen (PERCOCET/ROXICET) 5-325 MG tablet Take 1 tablet by mouth every 6 (six) hours as needed for severe pain. 08/24/16   Altamese Dilling, MD  povidone-iodine  (BETADINE) 10 % external solution To apply and clean the wound 3 times a week. 08/24/16   Altamese Dilling, MD  pravastatin (PRAVACHOL) 40 MG tablet Take 40 mg by mouth daily.    [provider]  propranolol (INDERAL) 40 MG tablet Take 1 tablet (40 mg total) by mouth 3 (three) times daily. 08/24/16   Altamese Dilling, MD  ramipril (ALTACE) 10 MG capsule Take 10 mg by mouth daily.    [provider]    Allergies Patient has no known allergies.  Family History  Problem Relation Age of Onset  . Diabetes Mother   . Diabetes Father     Social History Social History   Tobacco Use  . Smoking status: Never Smoker  . Smokeless tobacco: Never Used  Substance Use Topics  . Alcohol use: No  . Drug use: No    Review of Systems  Constitutional: Negative for fever. Cardiovascular: Negative for chest pain. Respiratory: Negative for shortness of breath. Gastrointestinal: Negative for abdominal pain, vomiting and diarrhea. Genitourinary: Negative for dysuria. Musculoskeletal: Negative for back pain. Skin: Negative for rash.  Left index finger laceration as above. Neurological: Negative for headaches, focal weakness or numbness. ____________________________________________  PHYSICAL EXAM:  VITAL SIGNS: ED Triage Vitals  Enc Vitals Group     BP 05/05/20 1239 (!) 186/78     Pulse Rate 05/05/20 1239 61     Resp 05/05/20 1239 20     Temp 05/05/20 1239 98.4 F (36.9 C)     Temp Source 05/05/20 1239 Oral     SpO2 05/05/20 1239 98 %     Weight 05/05/20 1237 220 lb (99.8 kg)     Height 05/05/20 1237 5\' 10"  (1.778 m)     Head Circumference --      Peak Flow --      Pain Score 05/05/20 1237 0     Pain Loc --      Pain Edu? --      Excl. in GC? --     Constitutional: Alert and oriented. Well appearing and in no distress. Head: Normocephalic and atraumatic. Eyes: Conjunctivae are normal. Normal extraocular movements Cardiovascular: Normal rate, regular  rhythm. Normal distal pulses. Respiratory: Normal respiratory effort. No wheezes/rales/rhonchi. Gastrointestinal: Soft and nontender. No distention. Musculoskeletal: Normal composite fist on the left.  Patient with a laceration noted to the radial aspect of the proximal phalanx of the left index finger.  Nontender with normal range of motion in all extremities.  Neurologic:  Normal sensation.  Normal speech and language. No gross focal neurologic deficits are appreciated. Skin:  Skin is warm, dry and intact. No rash noted. Psychiatric: Mood and affect are normal. Patient exhibits appropriate insight and judgment. ____________________________________________  PROCEDURES  .04/27/22Laceration Repair  Date/Time: 05/05/2020 1:31 PM Performed by: 05/07/2020, PA-C Authorized by: Lissa Hoard, PA-C   Consent:    Consent obtained:  Verbal   Consent given by:  Patient   Risks, benefits, and alternatives  were discussed: yes     Risks discussed:  Pain and poor wound healing Universal protocol:    Procedure explained and questions answered to patient or proxy's satisfaction: yes     Site/side marked: yes     Patient identity confirmed:  Verbally with patient Anesthesia:    Anesthesia method: transthecal block. Laceration details:    Location:  Finger   Finger location:  L index finger   Length (cm):  1.5   Depth (mm):  5 Pre-procedure details:    Preparation:  Patient was prepped and draped in usual sterile fashion Exploration:    Limited defect created (wound extended): no     Hemostasis achieved with:  Tourniquet   Contaminated: no   Treatment:    Area cleansed with:  Povidone-iodine and saline   Amount of cleaning:  Standard   Irrigation solution:  Sterile saline   Irrigation method:  Syringe   Debridement:  None   Undermining:  None   Scar revision: no   Skin repair:    Repair method:  Sutures   Suture size:  4-0   Suture material:  Nylon   Suture technique:   Simple interrupted   Number of sutures:  5 Approximation:    Approximation:  Close Repair type:    Repair type:  Simple Post-procedure details:    Dressing:  Non-adherent dressing and bulky dressing   Procedure completion:  Tolerated well, no immediate complications  ___________________________________________  INITIAL IMPRESSION / ASSESSMENT AND PLAN / ED COURSE  ED evaluation and management of an axonal laceration to the left index finger.  Patient consented to a suture repair.  Suture pair was performed good wound edge approximation.  He is discharged wound care instructions and supplies.  He will follow-up with primary provider for suture removal in 10 to 12 days.   Jason [name] was evaluated in Emergency Department on 05/05/2020 for the symptoms described in the history of present illness. He was evaluated in the context of the global COVID-19 pandemic, which necessitated consideration that the patient might be at risk for infection with the SARS-CoV-2 virus that causes COVID-19. Institutional protocols and algorithms that pertain to the evaluation of patients at risk for COVID-19 are in a state of rapid change based on information released by regulatory bodies including the CDC and federal and state organizations. These policies and algorithms were followed during the patient's care in the ED. ____________________________________________  FINAL CLINICAL IMPRESSION(S) / ED DIAGNOSES  Final diagnoses:  Laceration of left index finger without foreign body without damage to nail, initial encounter      Lissa Hoard, PA-C 05/05/20 1403    Delton Prairie, MD 05/07/20 [phone]

## 2020-05-06 ENCOUNTER — Other Ambulatory Visit (INDEPENDENT_AMBULATORY_CARE_PROVIDER_SITE_OTHER): Payer: Self-pay | Admitting: Vascular Surgery

## 2020-05-06 DIAGNOSIS — I70239 Atherosclerosis of native arteries of right leg with ulceration of unspecified site: Secondary | ICD-10-CM

## 2020-05-06 DIAGNOSIS — Z9862 Peripheral vascular angioplasty status: Secondary | ICD-10-CM

## 2020-05-07 ENCOUNTER — Ambulatory Visit (INDEPENDENT_AMBULATORY_CARE_PROVIDER_SITE_OTHER): Payer: Medicare HMO | Admitting: Nurse Practitioner

## 2020-05-07 ENCOUNTER — Ambulatory Visit (INDEPENDENT_AMBULATORY_CARE_PROVIDER_SITE_OTHER): Payer: Medicare HMO

## 2020-05-07 ENCOUNTER — Other Ambulatory Visit: Payer: Self-pay

## 2020-05-07 ENCOUNTER — Encounter (INDEPENDENT_AMBULATORY_CARE_PROVIDER_SITE_OTHER): Payer: Self-pay | Admitting: Nurse Practitioner

## 2020-05-07 VITALS — BP 163/81 | HR 56 | Ht 70.0 in | Wt 204.0 lb

## 2020-05-07 DIAGNOSIS — I70239 Atherosclerosis of native arteries of right leg with ulceration of unspecified site: Secondary | ICD-10-CM

## 2020-05-07 DIAGNOSIS — E11621 Type 2 diabetes mellitus with foot ulcer: Secondary | ICD-10-CM | POA: Diagnosis not present

## 2020-05-07 DIAGNOSIS — E782 Mixed hyperlipidemia: Secondary | ICD-10-CM

## 2020-05-07 DIAGNOSIS — Z9862 Peripheral vascular angioplasty status: Secondary | ICD-10-CM

## 2020-05-07 DIAGNOSIS — L97415 Non-pressure chronic ulcer of right heel and midfoot with muscle involvement without evidence of necrosis: Secondary | ICD-10-CM | POA: Diagnosis not present

## 2020-05-07 DIAGNOSIS — I10 Essential (primary) hypertension: Secondary | ICD-10-CM

## 2020-05-13 DIAGNOSIS — Z89412 Acquired absence of left great toe: Secondary | ICD-10-CM | POA: Diagnosis not present

## 2020-05-13 DIAGNOSIS — S61211A Laceration without foreign body of left index finger without damage to nail, initial encounter: Secondary | ICD-10-CM | POA: Diagnosis not present

## 2020-05-13 DIAGNOSIS — Z89421 Acquired absence of other right toe(s): Secondary | ICD-10-CM | POA: Diagnosis not present

## 2020-05-13 DIAGNOSIS — Z4802 Encounter for removal of sutures: Secondary | ICD-10-CM | POA: Diagnosis not present

## 2020-05-18 ENCOUNTER — Encounter (INDEPENDENT_AMBULATORY_CARE_PROVIDER_SITE_OTHER): Payer: Self-pay | Admitting: Nurse Practitioner

## 2020-05-18 NOTE — Progress Notes (Signed)
Subjective:    Patient ID: Jason [name], male    DOB: 1945/08/14, 75 y.o.   MRN: [id number] Chief Complaint  Patient presents with  . Follow-up    Post F/U lower extre angio    The patient returns to the office for followup and review status post angiogram with intervention. The patient notes improvement in the lower extremity symptoms. No interval shortening of the patient's claudication distance or rest pain symptoms. Previous wounds have shown increased wound healing.  No new ulcers or wounds have occurred since the last visit.  There have been no significant changes to the patient's overall health care.  The patient denies amaurosis fugax or recent TIA symptoms. There are no recent neurological changes noted. The patient denies history of DVT, PE or superficial thrombophlebitis. The patient denies recent episodes of angina or shortness of breath.   ABI's Rt=1.04 and Lt=0.94  (no previous ABIs) Duplex US of the right lower extremity shows monophasic waveforms.  There are no flow rate waveforms due to recent amputation however the waveforms are strong.  The left lower extremity has triphasic/monophasic waveforms with good toe waveforms to the second toe.   Review of Systems     Objective:   Physical Exam  BP (!) 163/81   Pulse (!) 56   Ht 5' 10"  (1.778 m)   Wt 204 lb (92.5 kg)   BMI 29.27 kg/m   Past Medical History:  Diagnosis Date  . CAD (coronary artery disease)   . Colon polyp   . Diabetes mellitus without complication ([place])   . Hyperlipemia   . Hypertension   . Orchitis   . PVD (peripheral vascular disease) ([place])   . Tremor     Social History   Socioeconomic History  . Marital status: Married    Spouse name: Not on file  . Number of children: Not on file  . Years of education: Not on file  . Highest education level: Not on file  Occupational History  . Occupation: Chemical engineer  Tobacco Use  . Smoking status: Never Smoker  . Smokeless tobacco:  Never Used  Substance and Sexual Activity  . Alcohol use: No  . Drug use: No  . Sexual activity: Not on file  Other Topics Concern  . Not on file  Social History Narrative  . Not on file   Social Determinants of Health   Financial Resource Strain: Not on file  Food Insecurity: Not on file  Transportation Needs: Not on file  Physical Activity: Not on file  Stress: Not on file  Social Connections: Not on file  Intimate Partner Violence: Not on file    Past Surgical History:  Procedure Laterality Date  . ACHILLES TENDON SURGERY Right 04/02/2020   Procedure: ACHILLES LENGTHENING;  Surgeon: Caroline More, DPM;  Location: [facility];  Service: Podiatry;  Laterality: Right;  . AMPUTATION Right 08/21/2016   Procedure: RAY RESECTION-RIGHT 5TH RAY;  Surgeon: Sharlotte Alamo, DPM;  Location: [facility] OR[facility];  Service: Podiatry;  Laterality: Right;  . AMPUTATION TOE Left    big  . CAROTID ENDARTERECTOMY Right   . COLON SURGERY    . CORONARY ARTERY BYPASS GRAFT    . LOWER EXTREMITY ANGIOGRAPHY Right 03/31/2020   Procedure: Lower Extremity Angiography;  Surgeon: Algernon Huxley, MD;  Location: [place] [facility];  Service: Cardiovascular;  Laterality: Right;  . ROTATOR CUFF REPAIR Right   . 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 Diabetes Mother   . Diabetes Father     No Known Allergies  CBC Latest Ref Rng & Units 04/05/2020 04/04/2020 04/03/2020  WBC 4.0 - 10.5 K/uL 6.6 7.5 8.2  Hemoglobin 13.0 - 17.0 g/dL 10.0(L) 10.7(L) 11.1(L)  Hematocrit 39.0 - 52.0 % 29.6(L) 31.9(L) 33.7(L)  Platelets 150 - 400 K/uL 211 241 236      CMP     Component Value Date/Time   NA 138 04/05/2020 0448   K 3.3 (L) 04/05/2020 0448   CL 108 04/05/2020 0448   CO2 25 04/05/2020 0448   GLUCOSE 179 (H) 04/05/2020 0448   BUN 24 (H)  04/05/2020 0448   CREATININE 0.73 04/05/2020 0448   CALCIUM 8.5 (L) 04/05/2020 0448   PROT 6.8 03/30/2020 0515   ALBUMIN 2.9 (L) 03/30/2020 0515   AST 19 03/30/2020 0515   ALT 19 03/30/2020 0515   ALKPHOS 75 03/30/2020 0515   BILITOT 0.7 03/30/2020 0515   GFRNONAA >60 04/05/2020 0448   GFRAA >60 08/23/2016 0409     VAS [place] ABI WITH/WO TBI  Result Date: 05/09/2020  LOWER EXTREMITY DOPPLER STUDY Patient Name:  Jason [name]  Date of Exam:   05/07/2020 Medical Rec #: [id number]       Accession #:    [id number] Date of Birth: 1945-03-10       Patient Gender: M Patient Age:   12Y Exam Location:  [facility] Procedure:      VAS [place] ABI WITH/WO TBI Referring Phys: [id number] [place] --------------------------------------------------------------------------------  Indications: Peripheral artery disease, and S/P Angioplasty.  Vascular Interventions: 03/31/2020: Aortogram and Selective Right Lower                         Extremity Angiogram. PTA of the Right Anterior Tibial                         Artery. PTA of the Right Mid Peroneal Artery. PTA of the                         Right Popliteal Artery and Tibioperoneal Trunk. Performing Technologist: Almira Coaster RVS  Examination Guidelines: A complete evaluation includes at minimum, Doppler waveform signals and systolic blood pressure reading at the level of bilateral brachial, anterior tibial, and posterior tibial arteries, when vessel segments are accessible. Bilateral testing is considered an integral part of a complete examination. Photoelectric Plethysmograph (PPG) waveforms and toe systolic pressure readings are included as required and additional duplex testing as needed. Limited examinations for reoccurring indications may be performed as noted.  ABI Findings: +---------+------------------+-----+----------+------------------+ Right    Rt Pressure (mmHg)IndexWaveform  Comment             +---------+------------------+-----+----------+------------------+ Brachial 206                                                 +---------+------------------+-----+----------+------------------+ ATA      214               1.04 monophasic                   +---------+------------------+-----+----------+------------------+  PTA      184               0.89 monophasic                   +---------+------------------+-----+----------+------------------+ Great Toe                                 1/2 Foot Amputated +---------+------------------+-----+----------+------------------+ +---------+------------------+-----+----------+-------+ Left     Lt Pressure (mmHg)IndexWaveform  Comment +---------+------------------+-----+----------+-------+ Brachial 201                                      +---------+------------------+-----+----------+-------+ ATA      193               0.94 triphasic         +---------+------------------+-----+----------+-------+ PTA      185               0.90 monophasic        +---------+------------------+-----+----------+-------+ Great Toe144               0.70 Normal    2nd Toe +---------+------------------+-----+----------+-------+ +-------+-----------+-----------+------------+------------+ ABI/TBIToday's ABIToday's TBIPrevious ABIPrevious TBI +-------+-----------+-----------+------------+------------+ Right  1.04                                           +-------+-----------+-----------+------------+------------+ Left   .94        .70                                 +-------+-----------+-----------+------------+------------+  Summary: Right: Resting right ankle-brachial index is within normal range. No evidence of significant right lower extremity arterial disease. ABIs are unreliable. Left: Resting left ankle-brachial index indicates mild left lower extremity arterial disease. The left toe-brachial index is normal.  *See  table(s) above for measurements and observations.  Electronically signed by Leotis Pain MD on 05/09/2020 at 12:38:36 PM.    Final        Assessment & Plan:   1. 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  The patient reports that the claudication symptoms and leg pain is essentially gone.   The patient denies lifestyle limiting changes at this point in time.  The patient's right transmetatarsal amputation has been healing.  No further invasive studies, angiography or surgery at this time The patient should continue walking and begin a more formal exercise program.  The patient should continue antiplatelet therapy and aggressive treatment of the lipid abnormalities   The patient should continue wearing graduated compression socks 10-15 mmHg strength to control the mild edema.  Patient should undergo noninvasive studies as ordered. The patient will follow up with me after the studies.    2. Essential hypertension Continue antihypertensive medications as already ordered, these medications have been reviewed and there are no changes at this time.   3. Hyperlipidemia, mixed Continue statin as ordered and reviewed, no changes at this time   4. Diabetic ulcer of right midfoot associated with type 2 diabetes mellitus, with muscle involvement without evidence of necrosis [facility]) The patient has had a right transmetatarsal  amputation follow-up evaluation.  He notes that it is healing without significant issue.  Patient will continue to follow with podiatry.  We will continue to consult as necessary if the wound worsens and reevaluate perfusion if necessary.   Current Outpatient Medications on File Prior to Visit  Medication Sig Dispense Refill  . aspirin 81 MG tablet Take 81 mg by mouth daily.    . clopidogrel (PLAVIX) 75 MG tablet Take 75 mg by  mouth daily.    . Continuous Blood Gluc Receiver (FREESTYLE LIBRE 14 DAY READER) DEVI Use 1 Device as directed    . Continuous Blood Gluc Receiver (FREESTYLE LIBRE 14 DAY READER) DEVI See admin instructions.    . Continuous Blood Gluc Receiver (FREESTYLE LIBRE 14 DAY READER) DEVI 1 Device.    . Continuous Blood Gluc Sensor (FREESTYLE LIBRE 14 DAY SENSOR) MISC SMARTSIG:1 Kit(s) Topical Every 2 Weeks    . docusate sodium (COLACE) 100 MG capsule Take 1 capsule (100 mg total) by mouth 2 (two) times daily as needed for mild constipation. 10 capsule 0  . glimepiride (AMARYL) 4 MG tablet Take 4 mg by mouth 2 (two) times daily.     . insulin glargine (LANTUS) 100 UNIT/ML injection Inject 9 Units into the skin at bedtime.     . insulin regular (NOVOLIN R,HUMULIN R) 100 units/mL injection Inject 5-10 Units into the skin 3 (three) times daily before meals.     . Magnesium 250 MG TABS Take by mouth.    . metFORMIN (GLUCOPHAGE) 1000 MG tablet Take 1,000 mg by mouth 2 (two) times daily with a meal.    . Multiple Vitamin (MULTIVITAMIN) capsule Take 1 capsule by mouth daily.    . povidone-iodine (BETADINE) 10 % external solution To apply and clean the wound 3 times a week. 236 mL 0  . pravastatin (PRAVACHOL) 40 MG tablet Take 40 mg by mouth daily.    . propranolol (INDERAL) 40 MG tablet Take 1 tablet (40 mg total) by mouth 3 (three) times daily. 90 tablet 0  . ramipril (ALTACE) 10 MG capsule Take 10 mg by mouth daily.    . rosuvastatin (CRESTOR) 20 MG tablet Take by mouth.    . gabapentin (NEURONTIN) 300 MG capsule Take 1 capsule (300 mg total) by mouth 3 (three) times daily for 14 days. 42 capsule 0  . oxyCODONE-acetaminophen (PERCOCET/ROXICET) 5-325 MG tablet Take 1 tablet by mouth every 6 (six) hours as needed for severe pain. (Patient not taking: Reported on 05/07/2020) 30 tablet 0   No current facility-administered medications on file prior to visit.    There are no Patient Instructions on file for this  visit. No follow-ups on file.   Kris Hartmann, NP

## 2020-06-13 DIAGNOSIS — E119 Type 2 diabetes mellitus without complications: Secondary | ICD-10-CM | POA: Diagnosis not present

## 2020-06-13 DIAGNOSIS — Z794 Long term (current) use of insulin: Secondary | ICD-10-CM | POA: Diagnosis not present

## 2020-06-13 DIAGNOSIS — E782 Mixed hyperlipidemia: Secondary | ICD-10-CM | POA: Diagnosis not present

## 2020-06-13 DIAGNOSIS — I10 Essential (primary) hypertension: Secondary | ICD-10-CM | POA: Diagnosis not present

## 2020-06-16 DIAGNOSIS — I679 Cerebrovascular disease, unspecified: Secondary | ICD-10-CM | POA: Diagnosis not present

## 2020-06-16 DIAGNOSIS — M129 Arthropathy, unspecified: Secondary | ICD-10-CM | POA: Diagnosis not present

## 2020-06-16 DIAGNOSIS — Z89412 Acquired absence of left great toe: Secondary | ICD-10-CM | POA: Diagnosis not present

## 2020-06-16 DIAGNOSIS — E1151 Type 2 diabetes mellitus with diabetic peripheral angiopathy without gangrene: Secondary | ICD-10-CM | POA: Diagnosis not present

## 2020-06-16 DIAGNOSIS — E7800 Pure hypercholesterolemia, unspecified: Secondary | ICD-10-CM | POA: Diagnosis not present

## 2020-06-16 DIAGNOSIS — H9193 Unspecified hearing loss, bilateral: Secondary | ICD-10-CM | POA: Diagnosis not present

## 2020-06-16 DIAGNOSIS — Z89431 Acquired absence of right foot: Secondary | ICD-10-CM | POA: Diagnosis not present

## 2020-06-16 DIAGNOSIS — I10 Essential (primary) hypertension: Secondary | ICD-10-CM | POA: Diagnosis not present

## 2020-06-18 DIAGNOSIS — E1142 Type 2 diabetes mellitus with diabetic polyneuropathy: Secondary | ICD-10-CM | POA: Diagnosis not present

## 2020-06-18 DIAGNOSIS — L851 Acquired keratosis [keratoderma] palmaris et plantaris: Secondary | ICD-10-CM | POA: Diagnosis not present

## 2020-06-20 DIAGNOSIS — Z89432 Acquired absence of left foot: Secondary | ICD-10-CM | POA: Diagnosis not present

## 2020-06-20 DIAGNOSIS — Z89431 Acquired absence of right foot: Secondary | ICD-10-CM | POA: Diagnosis not present

## 2020-06-20 DIAGNOSIS — E1142 Type 2 diabetes mellitus with diabetic polyneuropathy: Secondary | ICD-10-CM | POA: Diagnosis not present

## 2020-07-21 DIAGNOSIS — L851 Acquired keratosis [keratoderma] palmaris et plantaris: Secondary | ICD-10-CM | POA: Diagnosis not present

## 2020-07-21 DIAGNOSIS — E113592 Type 2 diabetes mellitus with proliferative diabetic retinopathy without macular edema, left eye: Secondary | ICD-10-CM | POA: Diagnosis not present

## 2020-07-21 DIAGNOSIS — Z89431 Acquired absence of right foot: Secondary | ICD-10-CM | POA: Diagnosis not present

## 2020-07-21 DIAGNOSIS — E113591 Type 2 diabetes mellitus with proliferative diabetic retinopathy without macular edema, right eye: Secondary | ICD-10-CM | POA: Diagnosis not present

## 2020-07-21 DIAGNOSIS — B351 Tinea unguium: Secondary | ICD-10-CM | POA: Diagnosis not present

## 2020-07-21 DIAGNOSIS — Z89429 Acquired absence of other toe(s), unspecified side: Secondary | ICD-10-CM | POA: Diagnosis not present

## 2020-07-21 DIAGNOSIS — E1142 Type 2 diabetes mellitus with diabetic polyneuropathy: Secondary | ICD-10-CM | POA: Diagnosis not present

## 2020-07-21 DIAGNOSIS — I739 Peripheral vascular disease, unspecified: Secondary | ICD-10-CM | POA: Diagnosis not present

## 2020-07-31 ENCOUNTER — Telehealth (INDEPENDENT_AMBULATORY_CARE_PROVIDER_SITE_OTHER): Payer: Self-pay

## 2020-07-31 NOTE — Telephone Encounter (Signed)
The pt called an left a VM on the nurses line,he wanted to reschedule his appointment for 7/25. Please reschedule.

## 2020-07-31 NOTE — Telephone Encounter (Signed)
Called and r/s'd patient

## 2020-08-04 ENCOUNTER — Ambulatory Visit (INDEPENDENT_AMBULATORY_CARE_PROVIDER_SITE_OTHER): Payer: Medicare HMO | Admitting: Nurse Practitioner

## 2020-08-04 ENCOUNTER — Encounter (INDEPENDENT_AMBULATORY_CARE_PROVIDER_SITE_OTHER): Payer: Medicare HMO

## 2020-08-29 ENCOUNTER — Other Ambulatory Visit (INDEPENDENT_AMBULATORY_CARE_PROVIDER_SITE_OTHER): Payer: Self-pay | Admitting: Nurse Practitioner

## 2020-08-29 DIAGNOSIS — I70235 Atherosclerosis of native arteries of right leg with ulceration of other part of foot: Secondary | ICD-10-CM

## 2020-08-29 DIAGNOSIS — Z9862 Peripheral vascular angioplasty status: Secondary | ICD-10-CM

## 2020-09-01 ENCOUNTER — Encounter (INDEPENDENT_AMBULATORY_CARE_PROVIDER_SITE_OTHER): Payer: Medicare HMO

## 2020-09-01 ENCOUNTER — Ambulatory Visit (INDEPENDENT_AMBULATORY_CARE_PROVIDER_SITE_OTHER): Payer: Medicare HMO | Admitting: Nurse Practitioner

## 2020-09-05 DIAGNOSIS — E083553 Diabetes mellitus due to underlying condition with stable proliferative diabetic retinopathy, bilateral: Secondary | ICD-10-CM | POA: Diagnosis not present

## 2020-09-05 DIAGNOSIS — E113553 Type 2 diabetes mellitus with stable proliferative diabetic retinopathy, bilateral: Secondary | ICD-10-CM | POA: Diagnosis not present

## 2020-09-05 DIAGNOSIS — Z961 Presence of intraocular lens: Secondary | ICD-10-CM | POA: Diagnosis not present

## 2020-09-05 DIAGNOSIS — H35341 Macular cyst, hole, or pseudohole, right eye: Secondary | ICD-10-CM | POA: Diagnosis not present

## 2020-09-05 DIAGNOSIS — H26492 Other secondary cataract, left eye: Secondary | ICD-10-CM | POA: Diagnosis not present

## 2020-10-10 DIAGNOSIS — H26492 Other secondary cataract, left eye: Secondary | ICD-10-CM | POA: Diagnosis not present

## 2020-11-07 DIAGNOSIS — H903 Sensorineural hearing loss, bilateral: Secondary | ICD-10-CM | POA: Diagnosis not present

## 2020-11-07 DIAGNOSIS — H6120 Impacted cerumen, unspecified ear: Secondary | ICD-10-CM | POA: Diagnosis not present

## 2020-11-14 DIAGNOSIS — L97511 Non-pressure chronic ulcer of other part of right foot limited to breakdown of skin: Secondary | ICD-10-CM | POA: Diagnosis not present

## 2020-11-14 DIAGNOSIS — L851 Acquired keratosis [keratoderma] palmaris et plantaris: Secondary | ICD-10-CM | POA: Diagnosis not present

## 2020-11-14 DIAGNOSIS — Z89431 Acquired absence of right foot: Secondary | ICD-10-CM | POA: Diagnosis not present

## 2020-11-14 DIAGNOSIS — Z89429 Acquired absence of other toe(s), unspecified side: Secondary | ICD-10-CM | POA: Diagnosis not present

## 2020-11-14 DIAGNOSIS — E1142 Type 2 diabetes mellitus with diabetic polyneuropathy: Secondary | ICD-10-CM | POA: Diagnosis not present

## 2020-11-14 DIAGNOSIS — L84 Corns and callosities: Secondary | ICD-10-CM | POA: Diagnosis not present

## 2020-11-14 DIAGNOSIS — B351 Tinea unguium: Secondary | ICD-10-CM | POA: Diagnosis not present

## 2020-11-14 DIAGNOSIS — I739 Peripheral vascular disease, unspecified: Secondary | ICD-10-CM | POA: Diagnosis not present

## 2020-12-15 DIAGNOSIS — E1142 Type 2 diabetes mellitus with diabetic polyneuropathy: Secondary | ICD-10-CM | POA: Diagnosis not present

## 2020-12-15 DIAGNOSIS — L851 Acquired keratosis [keratoderma] palmaris et plantaris: Secondary | ICD-10-CM | POA: Diagnosis not present

## 2020-12-22 DIAGNOSIS — E1151 Type 2 diabetes mellitus with diabetic peripheral angiopathy without gangrene: Secondary | ICD-10-CM | POA: Diagnosis not present

## 2020-12-22 DIAGNOSIS — M129 Arthropathy, unspecified: Secondary | ICD-10-CM | POA: Diagnosis not present

## 2020-12-22 DIAGNOSIS — E7800 Pure hypercholesterolemia, unspecified: Secondary | ICD-10-CM | POA: Diagnosis not present

## 2020-12-22 DIAGNOSIS — Z89431 Acquired absence of right foot: Secondary | ICD-10-CM | POA: Diagnosis not present

## 2020-12-22 DIAGNOSIS — Z89412 Acquired absence of left great toe: Secondary | ICD-10-CM | POA: Diagnosis not present

## 2020-12-22 DIAGNOSIS — Z23 Encounter for immunization: Secondary | ICD-10-CM | POA: Diagnosis not present

## 2020-12-22 DIAGNOSIS — Z0000 Encounter for general adult medical examination without abnormal findings: Secondary | ICD-10-CM | POA: Diagnosis not present

## 2020-12-22 DIAGNOSIS — Z1389 Encounter for screening for other disorder: Secondary | ICD-10-CM | POA: Diagnosis not present

## 2020-12-22 DIAGNOSIS — I10 Essential (primary) hypertension: Secondary | ICD-10-CM | POA: Diagnosis not present

## 2021-01-08 DIAGNOSIS — E1151 Type 2 diabetes mellitus with diabetic peripheral angiopathy without gangrene: Secondary | ICD-10-CM | POA: Diagnosis not present

## 2021-01-08 DIAGNOSIS — Z79899 Other long term (current) drug therapy: Secondary | ICD-10-CM | POA: Diagnosis not present

## 2021-01-08 DIAGNOSIS — Z7982 Long term (current) use of aspirin: Secondary | ICD-10-CM | POA: Diagnosis not present

## 2021-01-08 DIAGNOSIS — Z794 Long term (current) use of insulin: Secondary | ICD-10-CM | POA: Diagnosis not present

## 2021-01-08 DIAGNOSIS — L299 Pruritus, unspecified: Secondary | ICD-10-CM | POA: Diagnosis not present

## 2021-01-08 DIAGNOSIS — E785 Hyperlipidemia, unspecified: Secondary | ICD-10-CM | POA: Diagnosis not present

## 2021-01-08 DIAGNOSIS — L0390 Cellulitis, unspecified: Secondary | ICD-10-CM | POA: Diagnosis not present

## 2021-01-08 DIAGNOSIS — L03114 Cellulitis of left upper limb: Secondary | ICD-10-CM | POA: Diagnosis not present

## 2021-01-08 DIAGNOSIS — I10 Essential (primary) hypertension: Secondary | ICD-10-CM | POA: Diagnosis not present

## 2021-01-08 DIAGNOSIS — S51812A Laceration without foreign body of left forearm, initial encounter: Secondary | ICD-10-CM | POA: Diagnosis not present

## 2021-05-27 ENCOUNTER — Other Ambulatory Visit: Payer: Self-pay | Admitting: Podiatry

## 2021-06-10 NOTE — Discharge Instructions (Addendum)
[place]  POST OPERATIVE INSTRUCTIONS FOR DR. Vickki Muff AND DR. Imogene   Take your medication as prescribed.  Pain medication should be taken only as needed.  May take Tylenol between pain medication doses as well.  Keep the dressing clean, dry and intact.  Recommend maintaining nonweightbearing at all times to the right lower extremity due to the incision as well as wound to the plantar foot.  We will likely have to be nonweightbearing on the foot for the next 2 to 3 weeks until the incision/wound heals.  Keep your foot elevated above the heart level for the first 48 hours.  Continue elevation thereafter to improve swelling.  May also apply ice to the top of the right foot/side of the foot for maximum 10 minutes out of every 1 hour as needed.  Walking to the bathroom and brief periods of walking are acceptable, unless we have instructed you to be non-weight bearing.  Always wear your post-op shoe when walking.  Always use your crutches if you are to be non-weight bearing.  Do not take a shower. Baths are permissible as long as the foot is kept out of the water.   Every hour you are awake:  Bend your knee 15 times. Flex foot 15 times Massage calf 15 times  Call [facility] [phone]) if any of the following problems occur: You develop a temperature or fever. The bandage becomes saturated with blood. Medication does not stop your pain. Injury of the foot occurs. Any symptoms of infection including redness, odor, or red streaks running from wound.

## 2021-06-12 ENCOUNTER — Encounter: Payer: Self-pay | Admitting: Podiatry

## 2021-06-12 ENCOUNTER — Encounter: Payer: Self-pay | Admitting: Anesthesiology

## 2021-06-18 DIAGNOSIS — Z01818 Encounter for other preprocedural examination: Secondary | ICD-10-CM

## 2021-06-25 ENCOUNTER — Other Ambulatory Visit: Payer: Self-pay

## 2021-06-25 ENCOUNTER — Ambulatory Visit: Payer: Self-pay | Admitting: Certified Registered"

## 2021-06-25 ENCOUNTER — Ambulatory Visit
Admission: RE | Admit: 2021-06-25 | Discharge: 2021-06-25 | Disposition: A | Discharge status: Home / Self Care | Payer: Medicare PPO | Attending: Podiatry | Admitting: Podiatry

## 2021-06-25 ENCOUNTER — Ambulatory Visit: Payer: Self-pay

## 2021-06-25 ENCOUNTER — Encounter: Payer: Self-pay | Admitting: Podiatry

## 2021-06-25 ENCOUNTER — Encounter: Payer: Self-pay | Admitting: Certified Registered"

## 2021-06-25 ENCOUNTER — Encounter
Admission: RE | Disposition: A | Discharge status: Home / Self Care | Payer: Self-pay | Source: Home / Self Care | Attending: Podiatry

## 2021-06-25 DIAGNOSIS — E1142 Type 2 diabetes mellitus with diabetic polyneuropathy: Secondary | ICD-10-CM | POA: Insufficient documentation

## 2021-06-25 DIAGNOSIS — E11621 Type 2 diabetes mellitus with foot ulcer: Secondary | ICD-10-CM | POA: Insufficient documentation

## 2021-06-25 DIAGNOSIS — M899 Disorder of bone, unspecified: Secondary | ICD-10-CM | POA: Diagnosis present

## 2021-06-25 DIAGNOSIS — Z89431 Acquired absence of right foot: Secondary | ICD-10-CM | POA: Insufficient documentation

## 2021-06-25 DIAGNOSIS — L97512 Non-pressure chronic ulcer of other part of right foot with fat layer exposed: Secondary | ICD-10-CM | POA: Diagnosis not present

## 2021-06-25 DIAGNOSIS — E11610 Type 2 diabetes mellitus with diabetic neuropathic arthropathy: Secondary | ICD-10-CM | POA: Diagnosis not present

## 2021-06-25 DIAGNOSIS — Z01818 Encounter for other preprocedural examination: Secondary | ICD-10-CM

## 2021-06-25 HISTORY — DX: Unspecified osteoarthritis, unspecified site: M19.90

## 2021-06-25 HISTORY — DX: Cerebral infarction, unspecified: I63.9

## 2021-06-25 HISTORY — DX: Presence of dental prosthetic device (complete) (partial): Z97.2

## 2021-06-25 HISTORY — PX: BONE EXOSTOSIS EXCISION: SHX1249

## 2021-06-25 LAB — GLUCOSE, CAPILLARY
Glucose-Capillary: 135 mg/dL — ABNORMAL HIGH (ref 70–99)
Glucose-Capillary: 97 mg/dL (ref 70–99)

## 2021-06-25 SURGERY — EXCISION, EXOSTOSIS
Anesthesia: General | Site: Foot | Laterality: Right

## 2021-06-25 MED ORDER — AMOXICILLIN-POT CLAVULANATE 500-125 MG PO TABS: 1.0000 | ORAL_TABLET | Freq: Two times a day (BID) | ORAL | 0 refills | Status: AC

## 2021-06-25 MED ORDER — FENTANYL CITRATE PF 50 MCG/ML IJ SOSY: 25.0000 ug | PREFILLED_SYRINGE | INTRAMUSCULAR | Status: DC | PRN

## 2021-06-25 MED ORDER — HEMOSTATIC AGENTS (NO CHARGE) OPTIME
TOPICAL | Status: DC | PRN
  Administered 2021-06-25: 1 via TOPICAL

## 2021-06-25 MED ORDER — OXYCODONE HCL 5 MG PO TABS: 5.0000 mg | ORAL_TABLET | Freq: Once | ORAL | Status: DC | PRN

## 2021-06-25 MED ORDER — ACETAMINOPHEN 325 MG PO TABS: 325.0000 mg | ORAL_TABLET | ORAL | Status: DC | PRN

## 2021-06-25 MED ORDER — ONDANSETRON HCL 4 MG/2ML IJ SOLN
4.0000 mg | Freq: Once | INTRAMUSCULAR | Status: DC | PRN
Start: 2021-06-25 — End: 2021-06-25

## 2021-06-25 MED ORDER — LIDOCAINE HCL 1 % IJ SOLN
INTRAMUSCULAR | Status: DC | PRN
  Administered 2021-06-25: 10 mL

## 2021-06-25 MED ORDER — MIDAZOLAM HCL 2 MG/2ML IJ SOLN
INTRAMUSCULAR | Status: DC | PRN
  Administered 2021-06-25: 1 mg via INTRAVENOUS

## 2021-06-25 MED ORDER — PROPOFOL 500 MG/50ML IV EMUL
INTRAVENOUS | Status: DC | PRN
  Administered 2021-06-25: 25 ug/kg/min via INTRAVENOUS

## 2021-06-25 MED ORDER — LACTATED RINGERS IV SOLN: INTRAVENOUS | Status: DC

## 2021-06-25 MED ORDER — OXYCODONE HCL 5 MG/5ML PO SOLN: 5.0000 mg | Freq: Once | ORAL | Status: DC | PRN

## 2021-06-25 MED ORDER — 0.9 % SODIUM CHLORIDE (POUR BTL) OPTIME
TOPICAL | Status: DC | PRN
  Administered 2021-06-25: 500 mL

## 2021-06-25 MED ORDER — LIDOCAINE HCL (CARDIAC) PF 100 MG/5ML IV SOSY
PREFILLED_SYRINGE | INTRAVENOUS | Status: DC | PRN
  Administered 2021-06-25: 40 mg via INTRAVENOUS

## 2021-06-25 MED ORDER — CEFAZOLIN SODIUM-DEXTROSE 2-4 GM/100ML-% IV SOLN
2.0000 g | INTRAVENOUS | Status: AC
  Administered 2021-06-25: 2 g via INTRAVENOUS

## 2021-06-25 MED ORDER — ACETAMINOPHEN 160 MG/5ML PO SOLN: 325.0000 mg | ORAL | Status: DC | PRN

## 2021-06-25 MED ORDER — HYDROCODONE-ACETAMINOPHEN 5-325 MG PO TABS: 1.0000 | ORAL_TABLET | Freq: Four times a day (QID) | ORAL | 0 refills | Status: AC | PRN

## 2021-06-25 MED ORDER — FENTANYL CITRATE (PF) 100 MCG/2ML IJ SOLN
INTRAMUSCULAR | Status: DC | PRN
Start: 2021-06-25 — End: 2021-06-25
  Administered 2021-06-25: 25 ug via INTRAVENOUS

## 2021-06-25 SURGICAL SUPPLY — 31 items
BLADE MED AGGRESSIVE (BLADE) ×1 IMPLANT
BNDG CMPR STD VLCR NS LF 5.8X4 (GAUZE/BANDAGES/DRESSINGS) ×1
BNDG COHESIVE 4X5 TAN ST LF (GAUZE/BANDAGES/DRESSINGS) ×2 IMPLANT
BNDG ELASTIC 4X5.8 VLCR NS LF (GAUZE/BANDAGES/DRESSINGS) ×2 IMPLANT
BNDG ESMARK 4X12 TAN STRL LF (GAUZE/BANDAGES/DRESSINGS) ×2 IMPLANT
CANISTER SUCT 1200ML W/VALVE (MISCELLANEOUS) ×2 IMPLANT
COVER LIGHT HANDLE UNIVERSAL (MISCELLANEOUS) ×4 IMPLANT
CUFF TOURN SGL QUICK 18X4 (TOURNIQUET CUFF) IMPLANT
DRAPE FLUOR MINI C-ARM 54X84 (DRAPES) ×2 IMPLANT
DURAPREP 26ML APPLICATOR (WOUND CARE) ×2 IMPLANT
ELECT REM PT RETURN 9FT ADLT (ELECTROSURGICAL) ×2
ELECTRODE REM PT RTRN 9FT ADLT (ELECTROSURGICAL) ×1 IMPLANT
GAUZE SPONGE 4X4 12PLY STRL (GAUZE/BANDAGES/DRESSINGS) ×2 IMPLANT
GAUZE XEROFORM 1X8 LF (GAUZE/BANDAGES/DRESSINGS) ×2 IMPLANT
GLOVE PI ULTRA LF STRL 7.5 (GLOVE) ×1 IMPLANT
GLOVE PI ULTRA NON LATEX 7.5 (GLOVE) ×1
GLOVE SURG POLYISO LF SZ7 (GLOVE) ×4 IMPLANT
GOWN STRL REUS W/ TWL LRG LVL3 (GOWN DISPOSABLE) ×2 IMPLANT
GOWN STRL REUS W/TWL LRG LVL3 (GOWN DISPOSABLE) ×4
HEMOSTAT SURGICEL 2X3 (HEMOSTASIS) ×1 IMPLANT
KIT TURNOVER KIT A (KITS) ×2 IMPLANT
NS IRRIG 500ML POUR BTL (IV SOLUTION) ×2 IMPLANT
PACK EXTREMITY ARMC (MISCELLANEOUS) ×2 IMPLANT
PADDING CAST BLEND 4X4 NS (MISCELLANEOUS) ×6 IMPLANT
RASP SM TEAR CROSS CUT (RASP) ×1 IMPLANT
SPLINT CAST 1 STEP 4X30 (MISCELLANEOUS) ×2 IMPLANT
STOCKINETTE IMPERVIOUS LG (DRAPES) ×2 IMPLANT
SUT ETHILON 3-0 FS-10 30 BLK (SUTURE) ×2
SUT VIC AB 3-0 SH 27 (SUTURE) ×2
SUT VIC AB 3-0 SH 27X BRD (SUTURE) IMPLANT
SUTURE EHLN 3-0 FS-10 30 BLK (SUTURE) IMPLANT

## 2021-06-25 NOTE — H&P (Addendum)
H&P  Chief Complaint: Right foot ulcer   HPI: Jason [name] is a 76 y.o. male who presents with right foot chronic ulcer.  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  Patient continues to get ulcerations underneath the plantar cuboid area due to a plantar prominence is present.  Patient presents today for surgical intervention.  PMHx:  Past Medical History:  Diagnosis Date   Arthritis    CAD (coronary artery disease)    Colon polyp    Diabetes mellitus without complication ([place])    Hyperlipemia    Hypertension    Orchitis    PVD (peripheral vascular disease) ([place])    Stroke (cerebrum) ([facility])    Tremor    Wears dentures     Surgical Hx:  Past Surgical History:  Procedure Laterality Date   ACHILLES TENDON SURGERY Right 04/02/2020   Procedure: ACHILLES LENGTHENING;  Surgeon: Caroline More, DPM;  Location: [facility];  Service: Podiatry;  Laterality: Right;   AMPUTATION Right 08/21/2016   Procedure: RAY RESECTION-RIGHT 5TH RAY;  Surgeon: Sharlotte Alamo, DPM;  Location: [facility];  Service: Podiatry;  Laterality: Right;   AMPUTATION TOE Left    big   CAROTID ENDARTERECTOMY Right    COLON SURGERY     CORONARY ARTERY BYPASS GRAFT     LOWER EXTREMITY ANGIOGRAPHY Right 03/31/2020   Procedure: Lower Extremity Angiography;  Surgeon: Algernon Huxley, MD;  Location: [facility];  Service: Cardiovascular;  Laterality: Right;   ROTATOR CUFF REPAIR Right    TRANSMETATARSAL AMPUTATION Right 04/02/2020   Procedure: TRANSMETATARSAL AMPUTATION;  Surgeon: Caroline More, DPM;  Location: [facility];  Service: Podiatry;  Laterality: Right;    FHx:  Family History  Problem Relation Age of Onset   Diabetes Mother    Diabetes Father     Social History:  reports that he has never smoked. He has never used smokeless tobacco. He reports that he does not drink alcohol and does not use drugs.  Allergies: No Known Allergies  Review  of Systems: General ROS: negative Respiratory ROS: no cough, shortness of breath, or wheezing Cardiovascular ROS: no chest pain or dyspnea on exertion Gastrointestinal ROS: no abdominal pain, change in bowel habits, or black or bloody stools Musculoskeletal ROS: positive for - pain in foot - right Neurological ROS: positive for - numbness/tingling Dermatological ROS: positive for right foot wound  Medications Prior to Admission  Medication Sig Dispense Refill   Calcium Carbonate-Vit D-Min (CALCIUM 1200 PO) Take by mouth daily.     docusate sodium (COLACE) 100 MG capsule Take 1 capsule (100 mg total) by mouth 2 (two) times daily as needed for mild constipation. 10 capsule 0   glimepiride (AMARYL) 4 MG tablet Take 4 mg by mouth 2 (two) times daily.      insulin glargine (LANTUS) 100 UNIT/ML injection Inject 30 Units into the skin at bedtime.     insulin regular (NOVOLIN R,HUMULIN R) 100 units/mL injection Inject 5-10 Units into the skin 3 (three) times daily before meals.      metFORMIN (GLUCOPHAGE) 1000 MG tablet Take 1,000 mg by mouth 2 (two) times daily with a meal.     Multiple Vitamin (MULTIVITAMIN) capsule Take 1 capsule by mouth daily.     Potassium 99 MG TABS Take by mouth daily.     propranolol (INDERAL) 40 MG tablet Take 1 tablet (40 mg total) by mouth 3 (three) times daily. [place]  tablet 0   ramipril (ALTACE) 10 MG capsule Take 10 mg by mouth daily.     rosuvastatin (CRESTOR) 20 MG tablet Take by mouth.     aspirin 81 MG tablet Take 81 mg by mouth daily.     clopidogrel (PLAVIX) 75 MG tablet Take 75 mg by mouth daily.     Continuous Blood Gluc Receiver (FREESTYLE LIBRE 14 DAY READER) DEVI Use 1 Device as directed     Continuous Blood Gluc Receiver (FREESTYLE LIBRE 14 DAY READER) DEVI See admin instructions.     Continuous Blood Gluc Receiver (FREESTYLE LIBRE 14 DAY READER) DEVI 1 Device.     Continuous Blood Gluc Sensor (FREESTYLE LIBRE 14 DAY SENSOR) MISC SMARTSIG:1 Kit(s) Topical  Every 2 Weeks     gabapentin (NEURONTIN) 300 MG capsule Take 1 capsule (300 mg total) by mouth 3 (three) times daily for 14 days. (Patient not taking: Reported on 06/12/2021) 42 capsule 0   Magnesium 250 MG TABS Take by mouth.     oxyCODONE-acetaminophen (PERCOCET/ROXICET) 5-325 MG tablet Take 1 tablet by mouth every 6 (six) hours as needed for severe pain. (Patient not taking: Reported on 05/07/2020) 30 tablet 0   povidone-iodine (BETADINE) 10 % external solution To apply and clean the wound 3 times a week. 236 mL 0   pravastatin (PRAVACHOL) 40 MG tablet Take 40 mg by mouth daily. (Patient not taking: Reported on 06/12/2021)      Physical Exam: General: Alert and oriented.  No apparent distress.  Vascular: DP/PT pulses palpable bilateral.  Capillary fill time intact bilateral feet.  No hair growth noted to both feet.  Mild bilateral lower extremity nonpitting edema.  Neuro: Light touch sensation absent to bilateral lower extremities.  Derm: Ulceration subcuboid right foot.  Did not measure due to bandage placement today.  MSK: Charcot foot deformity right foot.  Right transmetatarsal amputation.  Left hallux amputation.  CV: Regular rate and rhythm  CTA: Clear to auscultation, nonlabored breathing  Results for orders placed or performed during the hospital encounter of 06/25/21 (from the past 48 hour(s))  Glucose, capillary     Status: Abnormal   Collection Time: 06/25/21 10:05 AM  Result Value Ref Range   Glucose-Capillary 135 (H) 70 - 99 mg/dL    Comment: Glucose reference range applies only to samples taken after fasting for at least 8 hours.   DG MINI C-ARM IMAGE ONLY  Result Date: 06/25/2021 There is no interpretation for this exam.  This order is for images obtained during a surgical procedure.  Please See "Surgeries" Tab for more information regarding the procedure.    Blood pressure (!) 182/81, pulse (!) 55, temperature 97.7 F (36.5 C), temperature source Temporal, height 5'  10" (1.778 m), weight 90.4 kg, SpO2 99 %.   Assessment Right plantar cuboid ulceration Right foot Charcot foot deformity Diabetes type 2 polyneuropathy  Plan -Patient seen and examined -X-ray imaging reviewed and discussed with patient detail. -All treatment options were discussed with the patient both conservative and surgical attempts at correction clean potential risks and complications at this time patient is elected for surgical intervention consisting of right foot exostectomy with wound debridement.  No guarantees given.  Consent obtained prior to procedure.  Caroline More 06/25/2021, 11:39 AM

## 2021-06-25 NOTE — Transfer of Care (Signed)
Immediate Anesthesia Transfer of Care Note  Patient: Jason [name]  Procedure(s) Performed: EXOSTOSIS RESECTION (Right: Foot)  Patient Location: PACU  Anesthesia Type: General  Level of Consciousness: awake, alert  and patient cooperative  Airway and Oxygen Therapy: Patient Spontanous Breathing and Patient connected to supplemental oxygen  Post-op Assessment: Post-op Vital signs reviewed, Patient's Cardiovascular Status Stable, Respiratory Function Stable, Patent Airway and No signs of Nausea or vomiting  Post-op Vital Signs: Reviewed and stable  Complications: No notable events documented.

## 2021-06-25 NOTE — Anesthesia Postprocedure Evaluation (Signed)
Anesthesia Post Note  Patient: Jason [name]  Procedure(s) Performed: EXOSTOSIS RESECTION (Right: Foot)     Patient location during evaluation: PACU Anesthesia Type: General Level of consciousness: awake and alert and oriented Pain management: satisfactory to patient Vital Signs Assessment: post-procedure vital signs reviewed and stable Respiratory status: spontaneous breathing, nonlabored ventilation and respiratory function stable Cardiovascular status: blood pressure returned to baseline and stable Postop Assessment: Adequate PO intake and No signs of nausea or vomiting Anesthetic complications: no   No notable events documented.  Cherly Beach

## 2021-06-25 NOTE — Anesthesia Procedure Notes (Signed)
Date/Time: 06/25/2021 11:56 AM  Performed by: Jimmy Picket, CRNAPre-anesthesia Checklist: Patient identified, Emergency Drugs available, Suction available, Timeout performed and Patient being monitored Patient Re-evaluated:Patient Re-evaluated prior to induction Oxygen Delivery Method: Simple face mask Placement Confirmation: positive ETCO2

## 2021-06-25 NOTE — Op Note (Signed)
PODIATRY / FOOT AND ANKLE SURGERY OPERATIVE REPORT    SURGEON: Caroline More, DPM  PRE-OPERATIVE DIAGNOSIS:  1.  Right exostosis plantar midfoot 2.  Right Charcot foot 3.  Diabetes type 2 polyneuropathy 4.  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  Exostosis plantar fifth metatarsal near the tarsometatarsal joint area. 2.  Wound debridement 100% subcutaneous excisional measures 0.2 x 0.2 x 0.3 cm preop, postdebridement measures 0.5 x 0.4 x 0.3 cm  PATHOLOGY/SPECIMEN(S): None  INDICATIONS:   Jason [name] is a 76 y.o. male who presents with a chronic nonhealing ulceration subcuboid/fifth tarsometatarsal joint area.  Patient has known history of Charcot and has been treated previously with conservatively with wound care and offloading measures but continues to be ulcerate.  All treatment options were discussed with the patient both conservative and surgical attempts at correction clean potential risks and complications at this time patient is elected for surgical intervention described above.  No guarantees given.  DESCRIPTION: After obtaining full informed written consent, the patient was brought back to the operating room and placed supine upon the operating table.  The patient received IV antibiotics prior to induction.  After obtaining adequate anesthesia, a preoperative block was performed with 10 cc of 1% lidocaine plain about the area of the ulcerative site.  The patient was prepped and draped in the standard fashion.  15 blade was used for 100% excisional subcutaneous wound debridement to the right plantar cuboid/fifth tarsometatarsal joint area removing fibrous tissue, hyperkeratotic tissue and biofilm, wound base  appeared to be 100% granular after debridement.  Predebridement measurement was 0.2 x 0.2 x 0.3 cm, postdebridement measurement was 0.5 x 0.4 x 0.3 cm.  C-arm imaging was then utilized to verify area of exostosis on the lateral view while holding a metal instrument up to the area of the wound.  This appeared to be near the fifth tarsometatarsal joint articulation site as there appeared to be a plantar prominence to this area.  An incision was made over the lateral foot near the fifth metatarsal base area.  The incision was deepened all the way to bone.  Full-thickness flap was then elevated off the periosteum of the bone at this level.  Notable Charcot joint changes were present to the area and the tissues appear to be very calcified to this area underneath the cuboid and fifth/fourth tarsometatarsal joint area.  Once the bone was able to be exposed to this area sagittal bone saw was then used to resect bone wedge from this area removing the plantar prominence.  This was resected and passed off in the operative site.  The area was checked further for any residual exostosis which were resected with the combination of paddle rasp and rongeur.  C-arm imaging was utilized to verify appropriate resection of bone which appeared to be excellent as there did not appear to be a plantar prominence underneath the area of the ulcerative site.  Patient still has notable Charcot changes noted on x-ray imaging as well as transmetatarsal amputation.  The surgical site was flushed with copious amounts normal sterile saline.  Surgicel was packed inside the wound due to some bleeding that was occurring from bone.  The subcutaneous tissue was then reapproximated well coapted with 3-0 Vicryl and the skin was then reapproximated well coapted with  3-0 nylon in a combination of simple and horizontal mattress type stitching.  The pneumatic ankle tourniquet was deflated and a prompt hyperemic response was noted all digits of the  right foot.  Postoperative dressing was applied consisting of Xeroform followed by 4 x 4 gauze, ABD, Kerlix, Ace wrap.  The patient tolerated the procedure and anesthesia well and was transferred to recovery room vital signs stable vascular status intact all toes the right foot.  Following.  Postoperative monitoring the patient be discharged with the appropriate orders and follow-up instructions.  Patient is to remain nonweightbearing at all times right lower extremity.   COMPLICATIONS: None  CONDITION: Good, stable  Caroline More, DPM

## 2021-06-25 NOTE — Anesthesia Preprocedure Evaluation (Signed)
Anesthesia Evaluation  Patient identified by MRN, date of birth, ID band Patient awake    Reviewed: Allergy & Precautions, H&P , NPO status , Patient's Chart, lab work & pertinent test results  Airway Mallampati: II  TM Distance: >3 FB Neck ROM: full    Dental  (+) Upper Dentures   Pulmonary    Pulmonary exam normal breath sounds clear to auscultation       Cardiovascular hypertension, + CAD, + CABG and + Peripheral Vascular Disease  Normal cardiovascular exam Rhythm:regular Rate:Normal     Neuro/Psych CVA, No Residual Symptoms    GI/Hepatic   Endo/Other  diabetes, Insulin Dependent  Renal/GU      Musculoskeletal   Abdominal   Peds  Hematology   Anesthesia Other Findings   Reproductive/Obstetrics                             Anesthesia Physical Anesthesia Plan  ASA: 3  Anesthesia Plan: General   Post-op Pain Management: Minimal or no pain anticipated   Induction:   PONV Risk Score and Plan: 2 and Treatment may vary due to age or medical condition, Ondansetron, Propofol infusion and TIVA  Airway Management Planned:   Additional Equipment:   Intra-op Plan:   Post-operative Plan:   Informed Consent: I have reviewed the patients History and Physical, chart, labs and discussed the procedure including the risks, benefits and alternatives for the proposed anesthesia with the patient or authorized representative who has indicated his/her understanding and acceptance.     Dental Advisory Given  Plan Discussed with: CRNA  Anesthesia Plan Comments:         Anesthesia Quick Evaluation

## 2021-06-25 NOTE — H&P (Signed)
HISTORY AND PHYSICAL INTERVAL NOTE:  06/25/2021  11:24 AM  Jason [name]  has presented today for surgery, with the diagnosis of E11.42 - Type 2 diabetest mellitus with polyneuropathy L97.512 - Neuropathic ulcer onn foot, right, with fat layer exposed M14.671 - Charcot's joint, ankle and foot.  The various methods of treatment have been discussed with the patient.  No guarantees were given.  After consideration of risks, benefits and other options for treatment, the patient has consented to surgery.  I have reviewed the patients' chart and labs.    Procedure: 1.  Exostectomy of foot right  A history and physical examination was performed in my office.  The patient was reexamined.  There have been no changes to this history and physical examination.  Rosetta Posner, DPM

## 2021-06-26 ENCOUNTER — Encounter: Payer: Self-pay | Admitting: Podiatry

## 2021-08-04 DIAGNOSIS — Z09 Encounter for follow-up examination after completed treatment for conditions other than malignant neoplasm: Secondary | ICD-10-CM | POA: Diagnosis not present

## 2021-08-04 DIAGNOSIS — E1142 Type 2 diabetes mellitus with diabetic polyneuropathy: Secondary | ICD-10-CM | POA: Diagnosis not present

## 2021-08-04 DIAGNOSIS — M14671 Charcot's joint, right ankle and foot: Secondary | ICD-10-CM | POA: Diagnosis not present

## 2021-08-04 DIAGNOSIS — L97511 Non-pressure chronic ulcer of other part of right foot limited to breakdown of skin: Secondary | ICD-10-CM | POA: Diagnosis not present

## 2021-08-04 DIAGNOSIS — I739 Peripheral vascular disease, unspecified: Secondary | ICD-10-CM | POA: Diagnosis not present

## 2021-08-04 DIAGNOSIS — L84 Corns and callosities: Secondary | ICD-10-CM | POA: Diagnosis not present

## 2021-08-04 DIAGNOSIS — Z89431 Acquired absence of right foot: Secondary | ICD-10-CM | POA: Diagnosis not present

## 2021-08-04 DIAGNOSIS — L851 Acquired keratosis [keratoderma] palmaris et plantaris: Secondary | ICD-10-CM | POA: Diagnosis not present

## 2021-08-14 DIAGNOSIS — E1151 Type 2 diabetes mellitus with diabetic peripheral angiopathy without gangrene: Secondary | ICD-10-CM | POA: Diagnosis not present

## 2021-08-14 DIAGNOSIS — E1142 Type 2 diabetes mellitus with diabetic polyneuropathy: Secondary | ICD-10-CM | POA: Diagnosis not present

## 2021-08-14 DIAGNOSIS — Z9181 History of falling: Secondary | ICD-10-CM | POA: Diagnosis not present

## 2021-08-14 DIAGNOSIS — Z4781 Encounter for orthopedic aftercare following surgical amputation: Secondary | ICD-10-CM | POA: Diagnosis not present

## 2021-08-14 DIAGNOSIS — I10 Essential (primary) hypertension: Secondary | ICD-10-CM | POA: Diagnosis not present

## 2021-08-14 DIAGNOSIS — Z7982 Long term (current) use of aspirin: Secondary | ICD-10-CM | POA: Diagnosis not present

## 2021-08-14 DIAGNOSIS — M1990 Unspecified osteoarthritis, unspecified site: Secondary | ICD-10-CM | POA: Diagnosis not present

## 2021-08-14 DIAGNOSIS — I2510 Atherosclerotic heart disease of native coronary artery without angina pectoris: Secondary | ICD-10-CM | POA: Diagnosis not present

## 2021-08-14 DIAGNOSIS — D649 Anemia, unspecified: Secondary | ICD-10-CM | POA: Diagnosis not present

## 2021-08-18 DIAGNOSIS — E1142 Type 2 diabetes mellitus with diabetic polyneuropathy: Secondary | ICD-10-CM | POA: Diagnosis not present

## 2021-08-18 DIAGNOSIS — L97511 Non-pressure chronic ulcer of other part of right foot limited to breakdown of skin: Secondary | ICD-10-CM | POA: Diagnosis not present

## 2021-09-01 DIAGNOSIS — L851 Acquired keratosis [keratoderma] palmaris et plantaris: Secondary | ICD-10-CM | POA: Diagnosis not present

## 2021-09-01 DIAGNOSIS — I739 Peripheral vascular disease, unspecified: Secondary | ICD-10-CM | POA: Diagnosis not present

## 2021-09-01 DIAGNOSIS — Z89431 Acquired absence of right foot: Secondary | ICD-10-CM | POA: Diagnosis not present

## 2021-09-01 DIAGNOSIS — E1142 Type 2 diabetes mellitus with diabetic polyneuropathy: Secondary | ICD-10-CM | POA: Diagnosis not present

## 2021-10-20 DIAGNOSIS — B351 Tinea unguium: Secondary | ICD-10-CM | POA: Diagnosis not present

## 2021-10-20 DIAGNOSIS — I739 Peripheral vascular disease, unspecified: Secondary | ICD-10-CM | POA: Diagnosis not present

## 2021-10-20 DIAGNOSIS — L851 Acquired keratosis [keratoderma] palmaris et plantaris: Secondary | ICD-10-CM | POA: Diagnosis not present

## 2021-10-20 DIAGNOSIS — E1142 Type 2 diabetes mellitus with diabetic polyneuropathy: Secondary | ICD-10-CM | POA: Diagnosis not present

## 2021-11-09 ENCOUNTER — Encounter (INDEPENDENT_AMBULATORY_CARE_PROVIDER_SITE_OTHER): Payer: Self-pay

## 2021-11-27 DIAGNOSIS — E1159 Type 2 diabetes mellitus with other circulatory complications: Secondary | ICD-10-CM | POA: Diagnosis not present

## 2021-11-27 DIAGNOSIS — Z794 Long term (current) use of insulin: Secondary | ICD-10-CM | POA: Diagnosis not present

## 2021-11-27 DIAGNOSIS — E1142 Type 2 diabetes mellitus with diabetic polyneuropathy: Secondary | ICD-10-CM | POA: Diagnosis not present

## 2021-11-27 DIAGNOSIS — I152 Hypertension secondary to endocrine disorders: Secondary | ICD-10-CM | POA: Diagnosis not present

## 2021-11-27 DIAGNOSIS — E1169 Type 2 diabetes mellitus with other specified complication: Secondary | ICD-10-CM | POA: Diagnosis not present

## 2021-11-27 DIAGNOSIS — E785 Hyperlipidemia, unspecified: Secondary | ICD-10-CM | POA: Diagnosis not present

## 2021-12-04 DIAGNOSIS — Z89432 Acquired absence of left foot: Secondary | ICD-10-CM | POA: Diagnosis not present

## 2021-12-04 DIAGNOSIS — E1142 Type 2 diabetes mellitus with diabetic polyneuropathy: Secondary | ICD-10-CM | POA: Diagnosis not present

## 2021-12-04 DIAGNOSIS — Z89431 Acquired absence of right foot: Secondary | ICD-10-CM | POA: Diagnosis not present

## 2022-02-02 DIAGNOSIS — L851 Acquired keratosis [keratoderma] palmaris et plantaris: Secondary | ICD-10-CM | POA: Diagnosis not present

## 2022-02-02 DIAGNOSIS — B351 Tinea unguium: Secondary | ICD-10-CM | POA: Diagnosis not present

## 2022-02-02 DIAGNOSIS — I739 Peripheral vascular disease, unspecified: Secondary | ICD-10-CM | POA: Diagnosis not present

## 2022-02-02 DIAGNOSIS — Z89431 Acquired absence of right foot: Secondary | ICD-10-CM | POA: Diagnosis not present

## 2022-02-02 DIAGNOSIS — E1142 Type 2 diabetes mellitus with diabetic polyneuropathy: Secondary | ICD-10-CM | POA: Diagnosis not present

## 2022-02-26 DIAGNOSIS — I10 Essential (primary) hypertension: Secondary | ICD-10-CM | POA: Diagnosis not present

## 2022-02-26 DIAGNOSIS — Z89431 Acquired absence of right foot: Secondary | ICD-10-CM | POA: Diagnosis not present

## 2022-02-26 DIAGNOSIS — Z89412 Acquired absence of left great toe: Secondary | ICD-10-CM | POA: Diagnosis not present

## 2022-02-26 DIAGNOSIS — E0851 Diabetes mellitus due to underlying condition with diabetic peripheral angiopathy without gangrene: Secondary | ICD-10-CM | POA: Diagnosis not present

## 2022-02-26 DIAGNOSIS — Z0000 Encounter for general adult medical examination without abnormal findings: Secondary | ICD-10-CM | POA: Diagnosis not present

## 2022-02-26 DIAGNOSIS — Z8679 Personal history of other diseases of the circulatory system: Secondary | ICD-10-CM | POA: Diagnosis not present

## 2022-02-26 DIAGNOSIS — E7800 Pure hypercholesterolemia, unspecified: Secondary | ICD-10-CM | POA: Diagnosis not present

## 2022-02-26 DIAGNOSIS — M129 Arthropathy, unspecified: Secondary | ICD-10-CM | POA: Diagnosis not present

## 2022-02-26 DIAGNOSIS — Z1331 Encounter for screening for depression: Secondary | ICD-10-CM | POA: Diagnosis not present

## 2022-03-02 DIAGNOSIS — E1142 Type 2 diabetes mellitus with diabetic polyneuropathy: Secondary | ICD-10-CM | POA: Diagnosis not present

## 2022-03-02 DIAGNOSIS — I739 Peripheral vascular disease, unspecified: Secondary | ICD-10-CM | POA: Diagnosis not present

## 2022-03-02 DIAGNOSIS — B351 Tinea unguium: Secondary | ICD-10-CM | POA: Diagnosis not present

## 2022-03-02 DIAGNOSIS — L851 Acquired keratosis [keratoderma] palmaris et plantaris: Secondary | ICD-10-CM | POA: Diagnosis not present

## 2022-03-02 DIAGNOSIS — M14671 Charcot's joint, right ankle and foot: Secondary | ICD-10-CM | POA: Diagnosis not present

## 2022-03-02 DIAGNOSIS — Z89431 Acquired absence of right foot: Secondary | ICD-10-CM | POA: Diagnosis not present

## 2022-03-05 DIAGNOSIS — E7800 Pure hypercholesterolemia, unspecified: Secondary | ICD-10-CM | POA: Diagnosis not present

## 2022-04-23 DIAGNOSIS — E1159 Type 2 diabetes mellitus with other circulatory complications: Secondary | ICD-10-CM | POA: Diagnosis not present

## 2022-04-23 DIAGNOSIS — Z79899 Other long term (current) drug therapy: Secondary | ICD-10-CM | POA: Diagnosis not present

## 2022-04-23 DIAGNOSIS — E1142 Type 2 diabetes mellitus with diabetic polyneuropathy: Secondary | ICD-10-CM | POA: Diagnosis not present

## 2022-04-23 DIAGNOSIS — E1169 Type 2 diabetes mellitus with other specified complication: Secondary | ICD-10-CM | POA: Diagnosis not present

## 2022-04-23 DIAGNOSIS — I152 Hypertension secondary to endocrine disorders: Secondary | ICD-10-CM | POA: Diagnosis not present

## 2022-04-23 DIAGNOSIS — E785 Hyperlipidemia, unspecified: Secondary | ICD-10-CM | POA: Diagnosis not present

## 2022-04-23 DIAGNOSIS — Z794 Long term (current) use of insulin: Secondary | ICD-10-CM | POA: Diagnosis not present

## 2022-05-14 DIAGNOSIS — E119 Type 2 diabetes mellitus without complications: Secondary | ICD-10-CM | POA: Diagnosis not present

## 2022-06-01 DIAGNOSIS — Z89422 Acquired absence of other left toe(s): Secondary | ICD-10-CM | POA: Diagnosis not present

## 2022-06-01 DIAGNOSIS — L97512 Non-pressure chronic ulcer of other part of right foot with fat layer exposed: Secondary | ICD-10-CM | POA: Diagnosis not present

## 2022-06-01 DIAGNOSIS — E1142 Type 2 diabetes mellitus with diabetic polyneuropathy: Secondary | ICD-10-CM | POA: Diagnosis not present

## 2022-06-01 DIAGNOSIS — B351 Tinea unguium: Secondary | ICD-10-CM | POA: Diagnosis not present

## 2022-06-01 DIAGNOSIS — Z89431 Acquired absence of right foot: Secondary | ICD-10-CM | POA: Diagnosis not present

## 2022-06-01 DIAGNOSIS — M14671 Charcot's joint, right ankle and foot: Secondary | ICD-10-CM | POA: Diagnosis not present

## 2022-06-01 DIAGNOSIS — I739 Peripheral vascular disease, unspecified: Secondary | ICD-10-CM | POA: Diagnosis not present

## 2022-06-01 DIAGNOSIS — L851 Acquired keratosis [keratoderma] palmaris et plantaris: Secondary | ICD-10-CM | POA: Diagnosis not present

## 2022-06-08 DIAGNOSIS — L97512 Non-pressure chronic ulcer of other part of right foot with fat layer exposed: Secondary | ICD-10-CM | POA: Diagnosis not present

## 2022-06-08 DIAGNOSIS — I739 Peripheral vascular disease, unspecified: Secondary | ICD-10-CM | POA: Diagnosis not present

## 2022-06-08 DIAGNOSIS — E1142 Type 2 diabetes mellitus with diabetic polyneuropathy: Secondary | ICD-10-CM | POA: Diagnosis not present

## 2022-06-08 DIAGNOSIS — Z89422 Acquired absence of other left toe(s): Secondary | ICD-10-CM | POA: Diagnosis not present

## 2022-06-08 DIAGNOSIS — Z89431 Acquired absence of right foot: Secondary | ICD-10-CM | POA: Diagnosis not present

## 2022-06-11 DIAGNOSIS — E113553 Type 2 diabetes mellitus with stable proliferative diabetic retinopathy, bilateral: Secondary | ICD-10-CM | POA: Diagnosis not present

## 2022-06-11 DIAGNOSIS — Z961 Presence of intraocular lens: Secondary | ICD-10-CM | POA: Diagnosis not present

## 2022-06-11 DIAGNOSIS — H527 Unspecified disorder of refraction: Secondary | ICD-10-CM | POA: Diagnosis not present

## 2022-06-11 DIAGNOSIS — H35341 Macular cyst, hole, or pseudohole, right eye: Secondary | ICD-10-CM | POA: Diagnosis not present

## 2022-06-22 DIAGNOSIS — B351 Tinea unguium: Secondary | ICD-10-CM | POA: Diagnosis not present

## 2022-06-22 DIAGNOSIS — L97521 Non-pressure chronic ulcer of other part of left foot limited to breakdown of skin: Secondary | ICD-10-CM | POA: Diagnosis not present

## 2022-06-22 DIAGNOSIS — Z89431 Acquired absence of right foot: Secondary | ICD-10-CM | POA: Diagnosis not present

## 2022-06-22 DIAGNOSIS — M14671 Charcot's joint, right ankle and foot: Secondary | ICD-10-CM | POA: Diagnosis not present

## 2022-06-22 DIAGNOSIS — L97522 Non-pressure chronic ulcer of other part of left foot with fat layer exposed: Secondary | ICD-10-CM | POA: Diagnosis not present

## 2022-06-22 DIAGNOSIS — L84 Corns and callosities: Secondary | ICD-10-CM | POA: Diagnosis not present

## 2022-06-22 DIAGNOSIS — L851 Acquired keratosis [keratoderma] palmaris et plantaris: Secondary | ICD-10-CM | POA: Diagnosis not present

## 2022-06-22 DIAGNOSIS — E1142 Type 2 diabetes mellitus with diabetic polyneuropathy: Secondary | ICD-10-CM | POA: Diagnosis not present

## 2022-06-22 DIAGNOSIS — Z89422 Acquired absence of other left toe(s): Secondary | ICD-10-CM | POA: Diagnosis not present

## 2022-07-06 DIAGNOSIS — L97522 Non-pressure chronic ulcer of other part of left foot with fat layer exposed: Secondary | ICD-10-CM | POA: Diagnosis not present

## 2022-07-06 DIAGNOSIS — I739 Peripheral vascular disease, unspecified: Secondary | ICD-10-CM | POA: Diagnosis not present

## 2022-07-06 DIAGNOSIS — E1142 Type 2 diabetes mellitus with diabetic polyneuropathy: Secondary | ICD-10-CM | POA: Diagnosis not present

## 2022-07-08 DIAGNOSIS — L97522 Non-pressure chronic ulcer of other part of left foot with fat layer exposed: Secondary | ICD-10-CM | POA: Diagnosis not present

## 2022-07-27 DIAGNOSIS — Z89431 Acquired absence of right foot: Secondary | ICD-10-CM | POA: Diagnosis not present

## 2022-07-27 DIAGNOSIS — Z89422 Acquired absence of other left toe(s): Secondary | ICD-10-CM | POA: Diagnosis not present

## 2022-07-27 DIAGNOSIS — L97522 Non-pressure chronic ulcer of other part of left foot with fat layer exposed: Secondary | ICD-10-CM | POA: Diagnosis not present

## 2022-07-27 DIAGNOSIS — L97521 Non-pressure chronic ulcer of other part of left foot limited to breakdown of skin: Secondary | ICD-10-CM | POA: Diagnosis not present

## 2022-07-27 DIAGNOSIS — I739 Peripheral vascular disease, unspecified: Secondary | ICD-10-CM | POA: Diagnosis not present

## 2022-07-27 DIAGNOSIS — E1142 Type 2 diabetes mellitus with diabetic polyneuropathy: Secondary | ICD-10-CM | POA: Diagnosis not present

## 2022-07-29 DIAGNOSIS — L97522 Non-pressure chronic ulcer of other part of left foot with fat layer exposed: Secondary | ICD-10-CM | POA: Diagnosis not present

## 2022-07-30 DIAGNOSIS — L97522 Non-pressure chronic ulcer of other part of left foot with fat layer exposed: Secondary | ICD-10-CM | POA: Diagnosis not present

## 2022-08-10 DIAGNOSIS — L97522 Non-pressure chronic ulcer of other part of left foot with fat layer exposed: Secondary | ICD-10-CM | POA: Diagnosis not present

## 2022-08-12 DIAGNOSIS — L97522 Non-pressure chronic ulcer of other part of left foot with fat layer exposed: Secondary | ICD-10-CM | POA: Diagnosis not present

## 2022-09-07 DIAGNOSIS — E1142 Type 2 diabetes mellitus with diabetic polyneuropathy: Secondary | ICD-10-CM | POA: Diagnosis not present

## 2022-09-07 DIAGNOSIS — L97522 Non-pressure chronic ulcer of other part of left foot with fat layer exposed: Secondary | ICD-10-CM | POA: Diagnosis not present

## 2022-09-07 DIAGNOSIS — I739 Peripheral vascular disease, unspecified: Secondary | ICD-10-CM | POA: Diagnosis not present

## 2022-09-28 DIAGNOSIS — I739 Peripheral vascular disease, unspecified: Secondary | ICD-10-CM | POA: Diagnosis not present

## 2022-09-28 DIAGNOSIS — E1142 Type 2 diabetes mellitus with diabetic polyneuropathy: Secondary | ICD-10-CM | POA: Diagnosis not present

## 2022-09-28 DIAGNOSIS — L97522 Non-pressure chronic ulcer of other part of left foot with fat layer exposed: Secondary | ICD-10-CM | POA: Diagnosis not present

## 2022-09-28 DIAGNOSIS — B351 Tinea unguium: Secondary | ICD-10-CM | POA: Diagnosis not present

## 2022-09-28 DIAGNOSIS — Z89431 Acquired absence of right foot: Secondary | ICD-10-CM | POA: Diagnosis not present

## 2022-09-28 DIAGNOSIS — Z89422 Acquired absence of other left toe(s): Secondary | ICD-10-CM | POA: Diagnosis not present

## 2022-10-01 IMAGING — MR MR FOOT*R* W/O CM
5 series · 40 of 40 positions shown · non-contrast
Comparison: Plain films right foot 03/16/2015.

CLINICAL DATA: Chronic right foot wound.  Pain.  Diabetic patient.

EXAM:
MRI OF THE RIGHT FOREFOOT WITHOUT CONTRAST
TECHNIQUE: Multiplanar, multisequence MR imaging of the right forefoot was
performed. No intravenous contrast was administered.

[Series 9: T1 · axial · right · 3.0mm · 0.70mm/px · z∈[-149,-79]mm · 5 of 20 slices shown (1 of 2)]
[im 1/20]
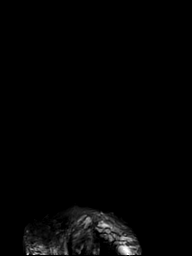
[im 5/20]
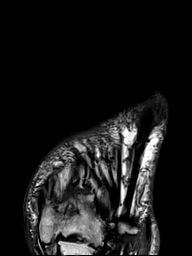
[im 10/20]
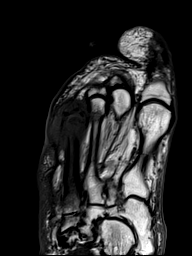
[im 15/20]
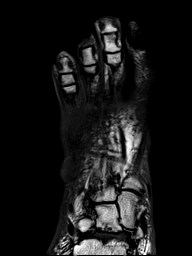
[im 20/20]
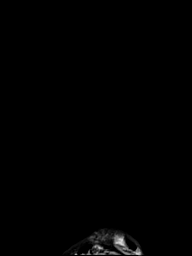

[Series 10: STIR · sagittal · right · 3.0mm · 0.62mm/px · 8 of 31 slices shown]
[im 1/31]
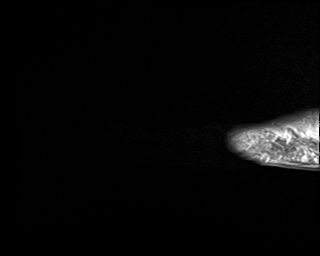
[im 5/31]
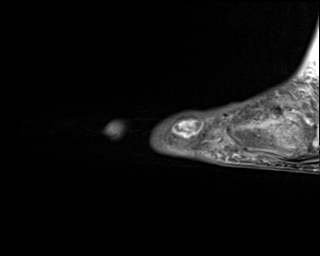
[im 9/31]
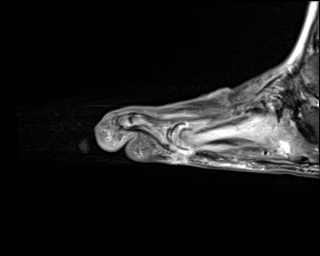
[im 13/31]
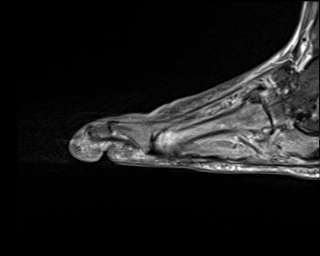
[im 18/31]
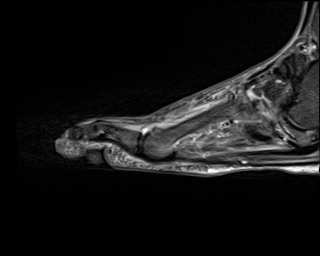
[im 22/31]
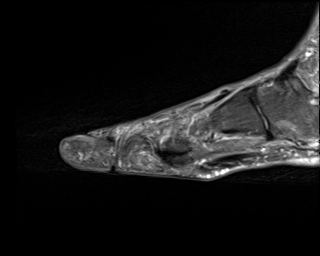
[im 26/31]
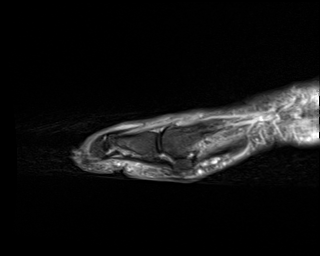
[im 31/31]
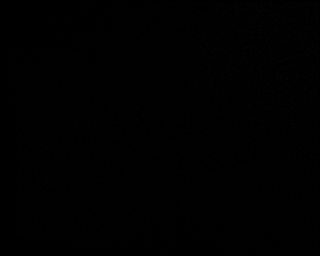

[Series 12: T2 · axial · right · 3.0mm · 0.70mm/px · z∈[-149,-79]mm · 5 of 20 slices shown (1 of 2)]
[im 1/20]
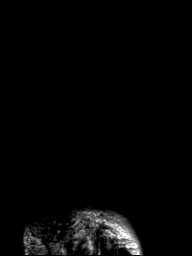
[im 5/20]
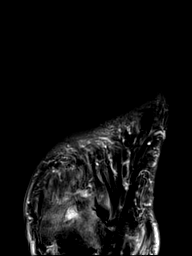
[im 10/20]
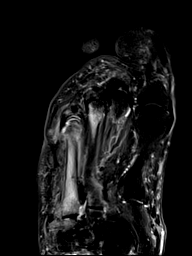
[im 15/20]
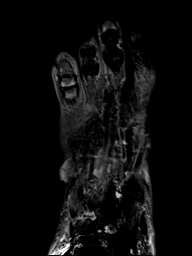
[im 20/20]
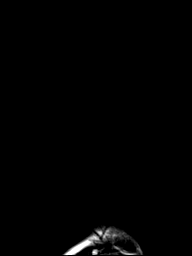

[Series 14: T2 · coronal · right · 3.0mm · 0.38mm/px · 11 of 45 slices shown (2 of 2)]
[im 1/45]
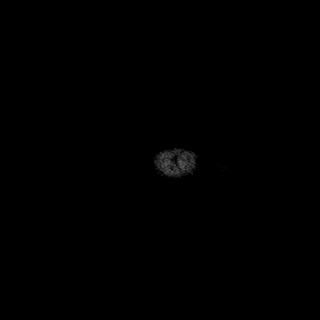
[im 5/45]
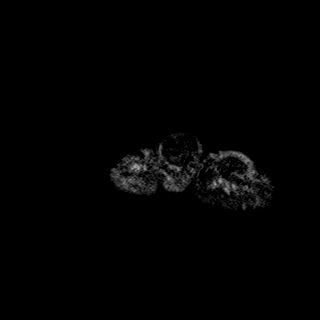
[im 9/45]
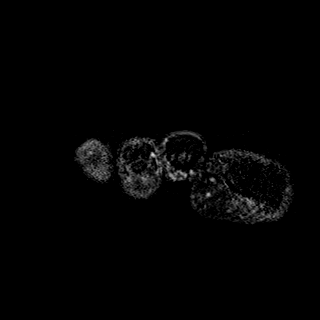
[im 14/45]
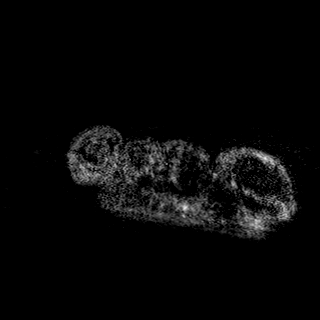
[im 18/45]
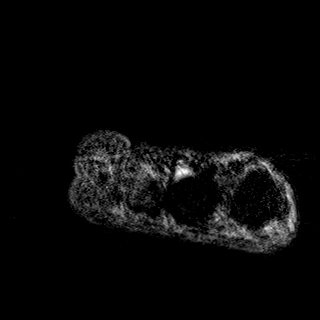
[im 23/45]
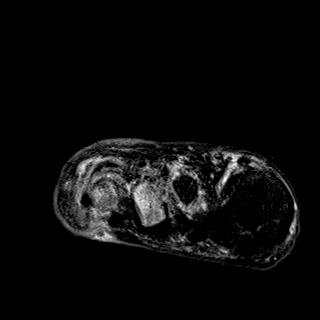
[im 27/45]
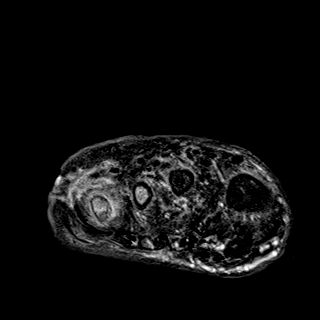
[im 31/45]
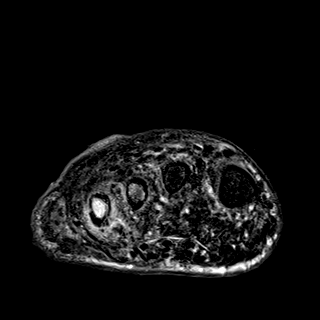
[im 36/45]
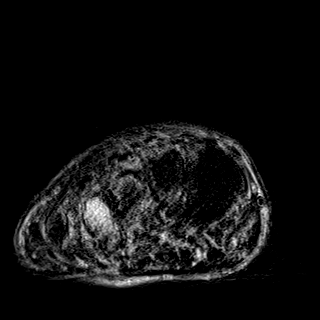
[im 40/45]
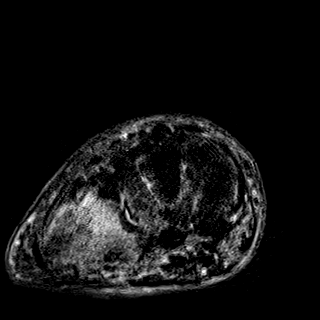
[im 45/45]
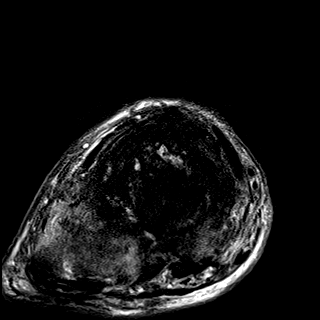

[Series 15: T1 · coronal · right · 3.0mm · 0.38mm/px · 11 of 45 slices shown (2 of 2)]
[im 1/45]
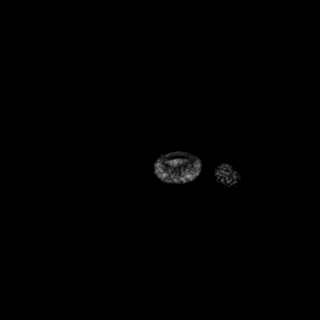
[im 5/45]
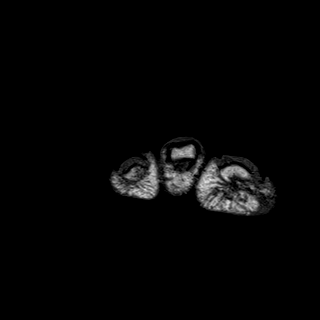
[im 9/45]
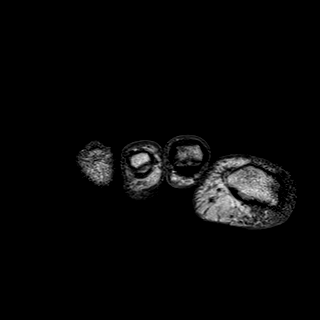
[im 14/45]
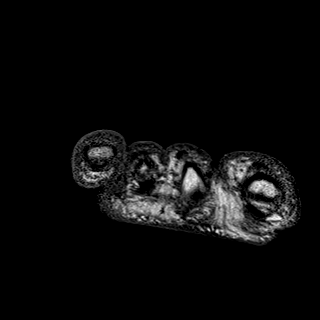
[im 18/45]
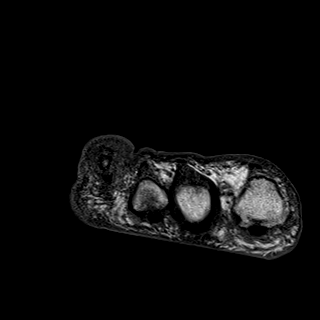
[im 23/45]
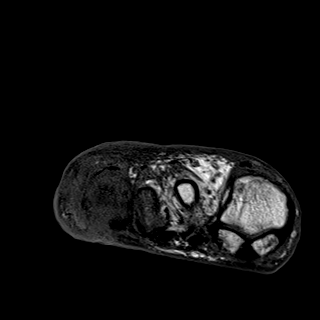
[im 27/45]
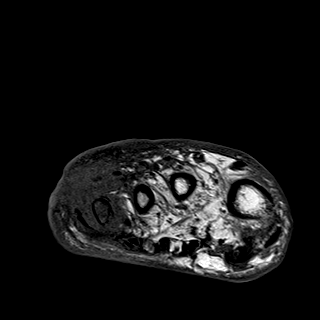
[im 31/45]
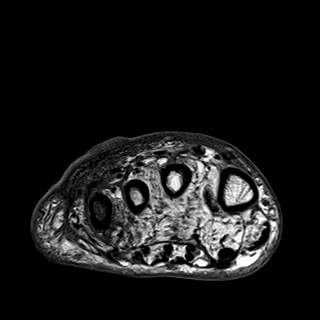
[im 36/45]
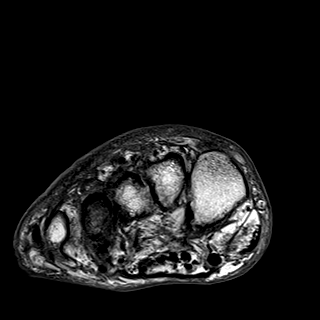
[im 40/45]
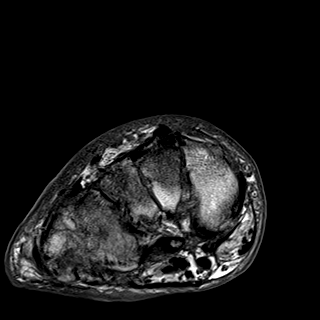
[im 45/45]
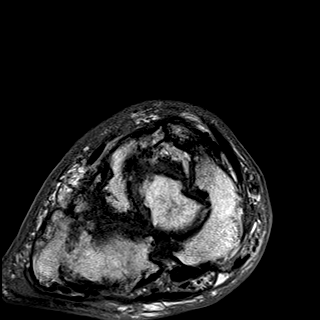

[40 of 40 positions shown; findings below may reference images not displayed]

FINDINGS: Bones/Joint/Cartilage

The patient has undergone amputation at the level of the proximal
and middle thirds of the diaphysis of the fifth metatarsal since the
prior exam. There is marrow edema throughout the fourth metatarsal,
fourth toe, third metatarsal and majority of the proximal phalanx of
the third toe. The middle phalanx of the third toe is spared but
there is edema in the distal phalanx of the third toe. Edema is also
seen in the cuboid and in the proximal 2.4 cm of the fifth
metatarsal remnant. There is osseous fusion across the cuboid and
fourth and fifth metatarsals. Bone marrow signal is otherwise
normal. Midfoot neuropathic change noted

Ligaments

Grossly intact.

Muscles and Tendons

Severe fatty atrophy of intrinsic musculature the foot.

Soft tissues

Skin wound is seen along the lateral aspect of the patient's stump.
Scattered locules of soft tissue gas predominant about the lateral
aspect of the foot are noted. There may also be a skin wound on the
plantar surface of the foot deep to the distal fourth metatarsal. No
abscess is identified.
IMPRESSION: Findings consistent with osteomyelitis in the cuboid, proximal
cm of the fifth metatarsal remnant, throughout the third and fourth
metatarsals, throughout the fourth toe and in the proximal and
distal phalanges of the third toe.

Negative for abscess or osteomyelitis with skin wounds and small
foci of soft tissue gas in the lateral aspect of the foot noted.

## 2022-10-01 IMAGING — US US EXTREM DUPLEX ARTERIAL UNILATERAL
1 series · 13 of 25 positions shown · non-contrast
Comparison: None.

CLINICAL DATA: Diabetic foot ulcer. Previous toe amputations.
Hypertension, hyperlipidemia, coronary artery disease, diabetes

EXAM:
RIGHT LOWER EXTREMITY ARTERIAL DUPLEX SCAN
TECHNIQUE: Gray-scale sonography as well as color Doppler and duplex ultrasound
was performed to evaluate the lower extremity arteries including the
common, superficial and profunda femoral arteries, popliteal artery
and calf arteries.

[Series 1: us arterial lower extremity duplex right(non-abi) · arterial · 13 of 26 slices shown]
[im 1/26]
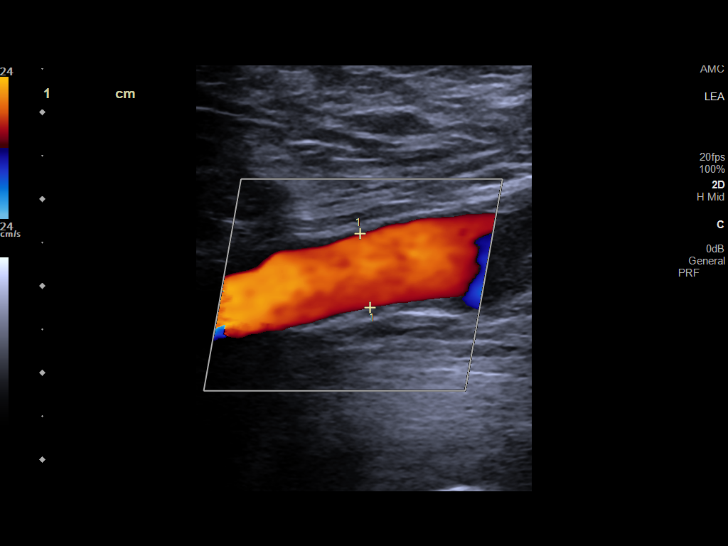
[im 3/26]
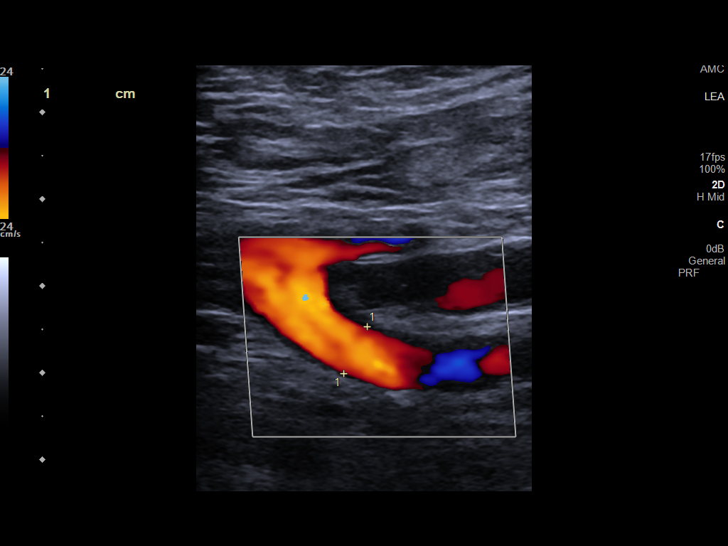
[im 5/26]
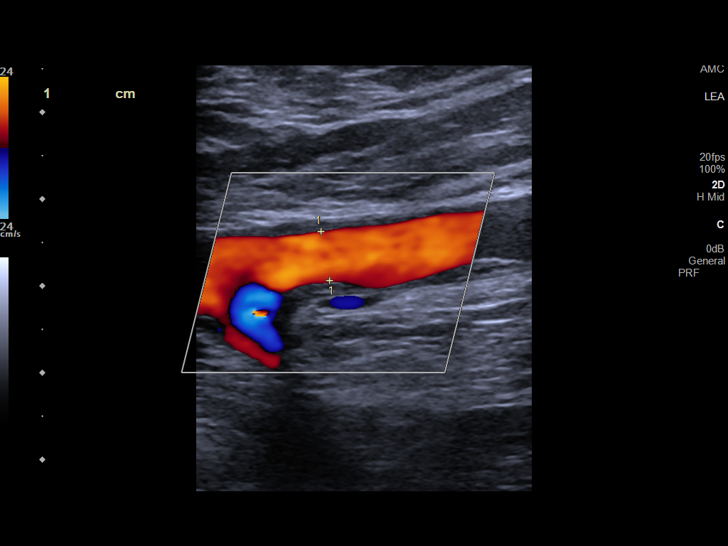
[im 7/26]
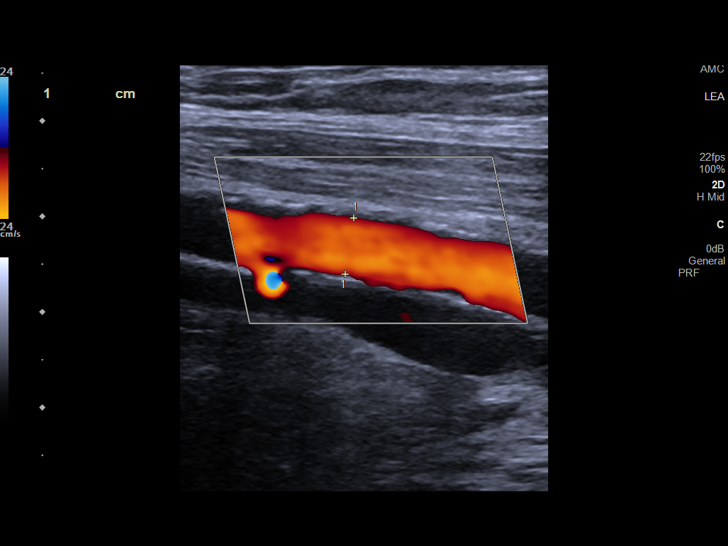
[im 9/26]
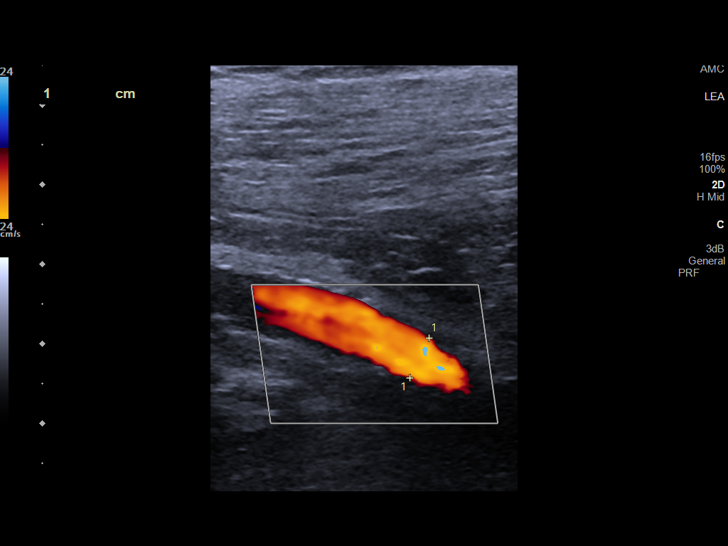
[im 11/26]
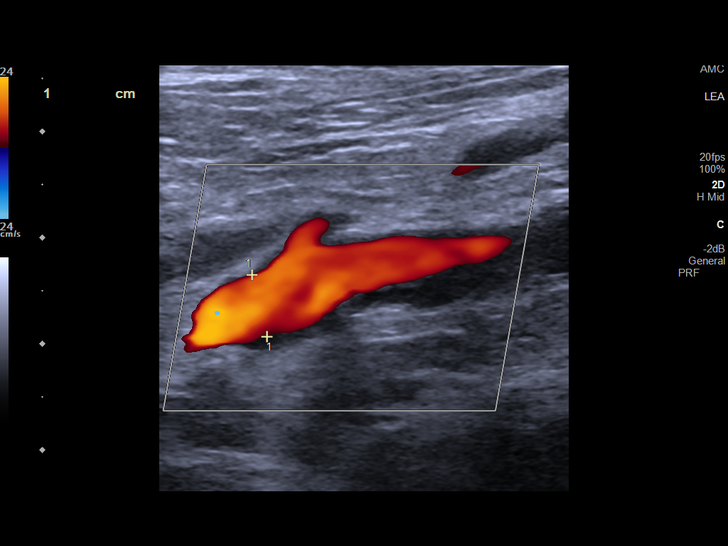
[im 13/26]
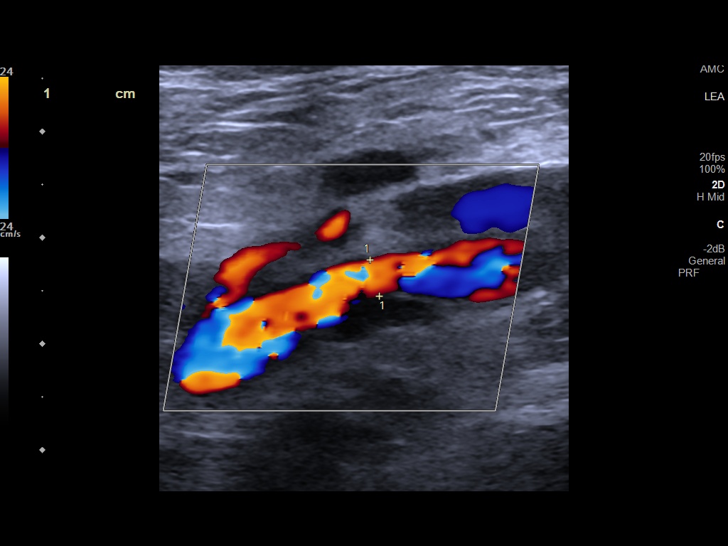
[im 15/26]
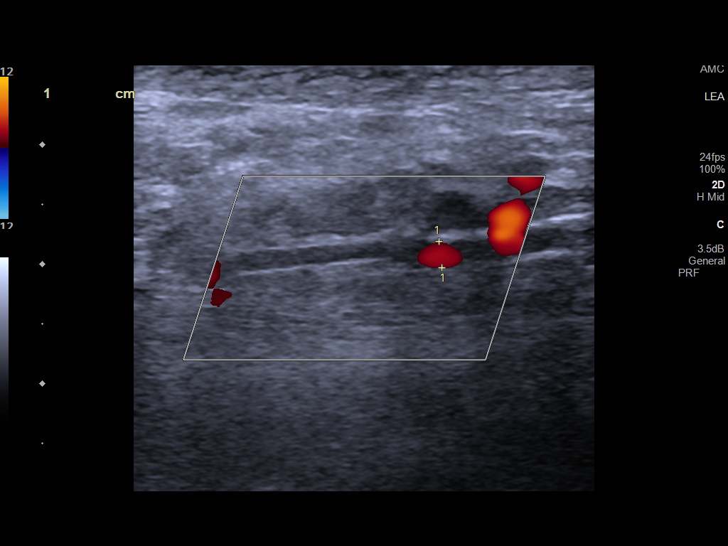
[im 17/26]
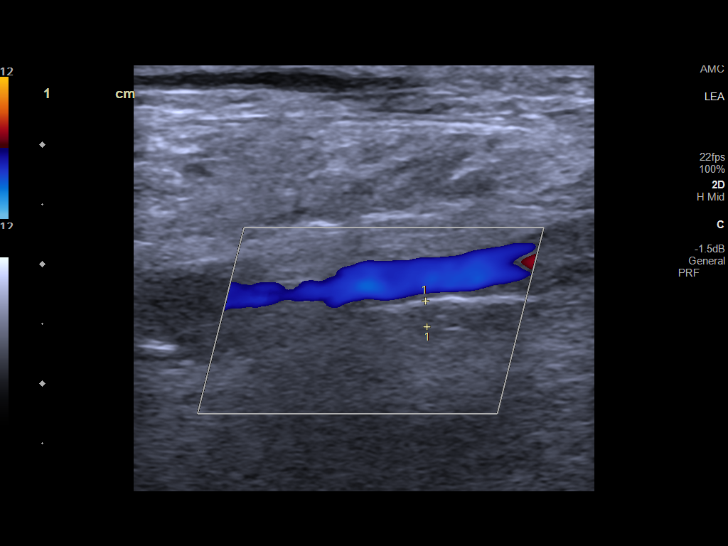
[im 19/26]
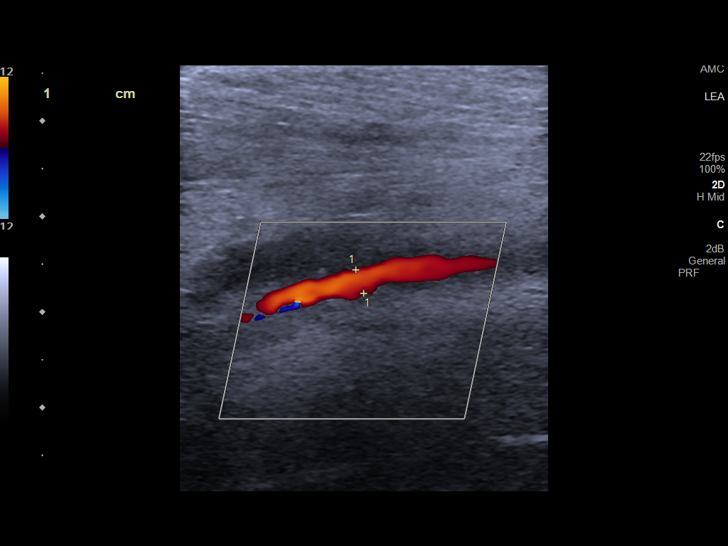
[im 21/26]
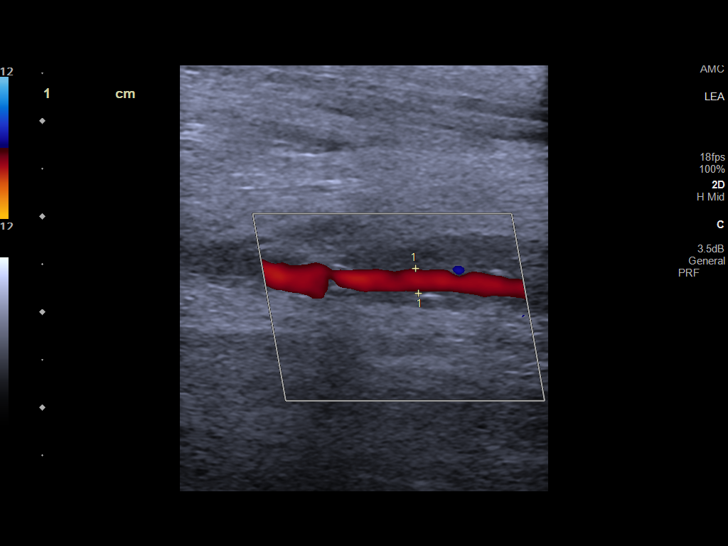
[im 23/26]
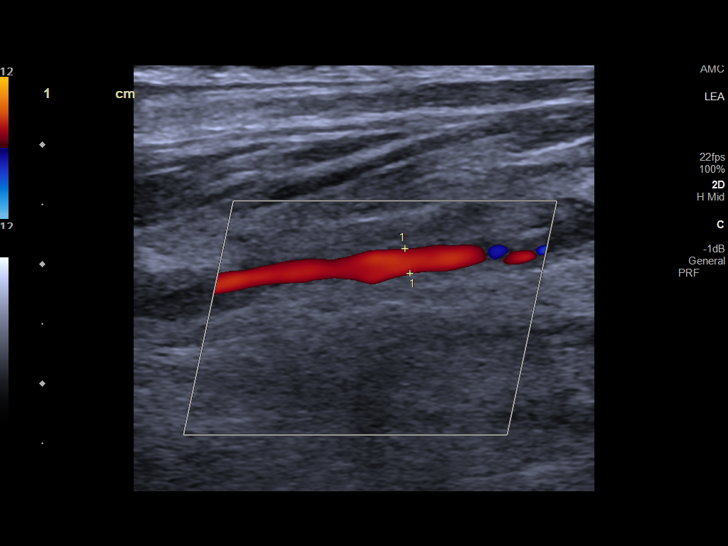
[im 26/26]
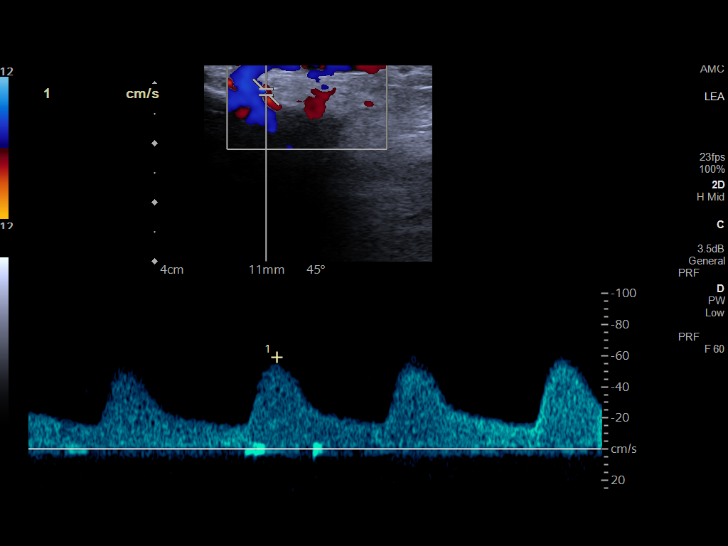

[13 of 25 positions shown; findings below may reference images not displayed]

FINDINGS: Right lower Extremity

ABI: Not calculated

Inflow: Normal common femoral arterial waveforms and velocities. No
evidence of inflow (aortoiliac) disease. Scattered nonocclusive
plaque.

Outflow: Normal profunda femoral, and proximal superficial femoral
arterial waveforms and velocities. Monophasic waveforms in the
distal SFA and popliteal artery. No focal elevation of the PSV to
suggest stenosis. Scattered nonocclusive plaque.

Runoff: Occlusion in the proximal/mid posterior tibial artery,
reconstituted distally with low velocity monophasic waveform. Patent
anterior tibial artery with monophasic waveform.
IMPRESSION: Occlusion in the right posterior tibial artery, reconstituted
distally. Correlate with ABIs.

## 2022-10-12 DIAGNOSIS — E1142 Type 2 diabetes mellitus with diabetic polyneuropathy: Secondary | ICD-10-CM | POA: Diagnosis not present

## 2022-10-12 DIAGNOSIS — I739 Peripheral vascular disease, unspecified: Secondary | ICD-10-CM | POA: Diagnosis not present

## 2022-10-12 DIAGNOSIS — S90822A Blister (nonthermal), left foot, initial encounter: Secondary | ICD-10-CM | POA: Diagnosis not present

## 2022-10-12 DIAGNOSIS — B351 Tinea unguium: Secondary | ICD-10-CM | POA: Diagnosis not present

## 2022-10-12 DIAGNOSIS — L851 Acquired keratosis [keratoderma] palmaris et plantaris: Secondary | ICD-10-CM | POA: Diagnosis not present

## 2022-10-12 DIAGNOSIS — M14671 Charcot's joint, right ankle and foot: Secondary | ICD-10-CM | POA: Diagnosis not present

## 2022-10-12 DIAGNOSIS — Z89431 Acquired absence of right foot: Secondary | ICD-10-CM | POA: Diagnosis not present

## 2022-10-12 DIAGNOSIS — L97522 Non-pressure chronic ulcer of other part of left foot with fat layer exposed: Secondary | ICD-10-CM | POA: Diagnosis not present

## 2022-10-12 DIAGNOSIS — Z89422 Acquired absence of other left toe(s): Secondary | ICD-10-CM | POA: Diagnosis not present

## 2022-10-26 DIAGNOSIS — M14671 Charcot's joint, right ankle and foot: Secondary | ICD-10-CM | POA: Diagnosis not present

## 2022-10-26 DIAGNOSIS — L97522 Non-pressure chronic ulcer of other part of left foot with fat layer exposed: Secondary | ICD-10-CM | POA: Diagnosis not present

## 2022-10-26 DIAGNOSIS — L851 Acquired keratosis [keratoderma] palmaris et plantaris: Secondary | ICD-10-CM | POA: Diagnosis not present

## 2022-10-26 DIAGNOSIS — Z89422 Acquired absence of other left toe(s): Secondary | ICD-10-CM | POA: Diagnosis not present

## 2022-10-26 DIAGNOSIS — Z89431 Acquired absence of right foot: Secondary | ICD-10-CM | POA: Diagnosis not present

## 2022-10-26 DIAGNOSIS — L84 Corns and callosities: Secondary | ICD-10-CM | POA: Diagnosis not present

## 2022-10-26 DIAGNOSIS — E1142 Type 2 diabetes mellitus with diabetic polyneuropathy: Secondary | ICD-10-CM | POA: Diagnosis not present

## 2022-10-26 DIAGNOSIS — L03116 Cellulitis of left lower limb: Secondary | ICD-10-CM | POA: Diagnosis not present

## 2022-10-26 DIAGNOSIS — B351 Tinea unguium: Secondary | ICD-10-CM | POA: Diagnosis not present

## 2022-11-02 DIAGNOSIS — B351 Tinea unguium: Secondary | ICD-10-CM | POA: Diagnosis not present

## 2022-11-02 DIAGNOSIS — Z89422 Acquired absence of other left toe(s): Secondary | ICD-10-CM | POA: Diagnosis not present

## 2022-11-02 DIAGNOSIS — L84 Corns and callosities: Secondary | ICD-10-CM | POA: Diagnosis not present

## 2022-11-02 DIAGNOSIS — E1142 Type 2 diabetes mellitus with diabetic polyneuropathy: Secondary | ICD-10-CM | POA: Diagnosis not present

## 2022-11-02 DIAGNOSIS — M14671 Charcot's joint, right ankle and foot: Secondary | ICD-10-CM | POA: Diagnosis not present

## 2022-11-02 DIAGNOSIS — L851 Acquired keratosis [keratoderma] palmaris et plantaris: Secondary | ICD-10-CM | POA: Diagnosis not present

## 2022-11-02 DIAGNOSIS — L97522 Non-pressure chronic ulcer of other part of left foot with fat layer exposed: Secondary | ICD-10-CM | POA: Diagnosis not present

## 2022-11-02 DIAGNOSIS — L97523 Non-pressure chronic ulcer of other part of left foot with necrosis of muscle: Secondary | ICD-10-CM | POA: Diagnosis not present

## 2022-11-02 DIAGNOSIS — I739 Peripheral vascular disease, unspecified: Secondary | ICD-10-CM | POA: Diagnosis not present

## 2022-11-02 DIAGNOSIS — Z89431 Acquired absence of right foot: Secondary | ICD-10-CM | POA: Diagnosis not present

## 2022-11-09 DIAGNOSIS — I739 Peripheral vascular disease, unspecified: Secondary | ICD-10-CM | POA: Diagnosis not present

## 2022-11-09 DIAGNOSIS — Z89431 Acquired absence of right foot: Secondary | ICD-10-CM | POA: Diagnosis not present

## 2022-11-09 DIAGNOSIS — L97523 Non-pressure chronic ulcer of other part of left foot with necrosis of muscle: Secondary | ICD-10-CM | POA: Diagnosis not present

## 2022-11-09 DIAGNOSIS — E1142 Type 2 diabetes mellitus with diabetic polyneuropathy: Secondary | ICD-10-CM | POA: Diagnosis not present

## 2022-11-09 DIAGNOSIS — Z89422 Acquired absence of other left toe(s): Secondary | ICD-10-CM | POA: Diagnosis not present

## 2022-11-12 DIAGNOSIS — E785 Hyperlipidemia, unspecified: Secondary | ICD-10-CM | POA: Diagnosis not present

## 2022-11-12 DIAGNOSIS — I152 Hypertension secondary to endocrine disorders: Secondary | ICD-10-CM | POA: Diagnosis not present

## 2022-11-12 DIAGNOSIS — E1142 Type 2 diabetes mellitus with diabetic polyneuropathy: Secondary | ICD-10-CM | POA: Diagnosis not present

## 2022-11-12 DIAGNOSIS — E1159 Type 2 diabetes mellitus with other circulatory complications: Secondary | ICD-10-CM | POA: Diagnosis not present

## 2022-11-12 DIAGNOSIS — Z794 Long term (current) use of insulin: Secondary | ICD-10-CM | POA: Diagnosis not present

## 2022-11-12 DIAGNOSIS — E1169 Type 2 diabetes mellitus with other specified complication: Secondary | ICD-10-CM | POA: Diagnosis not present

## 2022-11-16 DIAGNOSIS — L97523 Non-pressure chronic ulcer of other part of left foot with necrosis of muscle: Secondary | ICD-10-CM | POA: Diagnosis not present

## 2022-11-16 DIAGNOSIS — E1142 Type 2 diabetes mellitus with diabetic polyneuropathy: Secondary | ICD-10-CM | POA: Diagnosis not present

## 2022-11-16 DIAGNOSIS — I739 Peripheral vascular disease, unspecified: Secondary | ICD-10-CM | POA: Diagnosis not present

## 2022-11-16 DIAGNOSIS — Z89431 Acquired absence of right foot: Secondary | ICD-10-CM | POA: Diagnosis not present

## 2022-11-16 DIAGNOSIS — M86172 Other acute osteomyelitis, left ankle and foot: Secondary | ICD-10-CM | POA: Diagnosis not present

## 2022-11-16 DIAGNOSIS — M14671 Charcot's joint, right ankle and foot: Secondary | ICD-10-CM | POA: Diagnosis not present

## 2022-11-16 DIAGNOSIS — Z89422 Acquired absence of other left toe(s): Secondary | ICD-10-CM | POA: Diagnosis not present

## 2022-11-23 DIAGNOSIS — E1142 Type 2 diabetes mellitus with diabetic polyneuropathy: Secondary | ICD-10-CM | POA: Diagnosis not present

## 2022-11-23 DIAGNOSIS — I739 Peripheral vascular disease, unspecified: Secondary | ICD-10-CM | POA: Diagnosis not present

## 2022-11-23 DIAGNOSIS — M14671 Charcot's joint, right ankle and foot: Secondary | ICD-10-CM | POA: Diagnosis not present

## 2022-11-23 DIAGNOSIS — M86172 Other acute osteomyelitis, left ankle and foot: Secondary | ICD-10-CM | POA: Diagnosis not present

## 2022-11-23 DIAGNOSIS — Z89431 Acquired absence of right foot: Secondary | ICD-10-CM | POA: Diagnosis not present

## 2022-11-23 DIAGNOSIS — L97524 Non-pressure chronic ulcer of other part of left foot with necrosis of bone: Secondary | ICD-10-CM | POA: Diagnosis not present

## 2022-11-23 DIAGNOSIS — Z89422 Acquired absence of other left toe(s): Secondary | ICD-10-CM | POA: Diagnosis not present

## 2022-11-23 DIAGNOSIS — L03116 Cellulitis of left lower limb: Secondary | ICD-10-CM | POA: Diagnosis not present

## 2022-11-24 ENCOUNTER — Inpatient Hospital Stay
Admission: EM | Admit: 2022-11-24 | Discharge: 2022-11-28 | DRG: 240 | Disposition: A | Discharge status: Home Health | Payer: Medicare PPO | Attending: Internal Medicine | Admitting: Internal Medicine

## 2022-11-24 ENCOUNTER — Other Ambulatory Visit: Payer: Self-pay

## 2022-11-24 ENCOUNTER — Emergency Department: Payer: Medicare PPO

## 2022-11-24 ENCOUNTER — Encounter: Payer: Self-pay | Admitting: Internal Medicine

## 2022-11-24 DIAGNOSIS — I739 Peripheral vascular disease, unspecified: Secondary | ICD-10-CM | POA: Diagnosis not present

## 2022-11-24 DIAGNOSIS — Z833 Family history of diabetes mellitus: Secondary | ICD-10-CM | POA: Diagnosis not present

## 2022-11-24 DIAGNOSIS — M868X6 Other osteomyelitis, lower leg: Secondary | ICD-10-CM | POA: Diagnosis present

## 2022-11-24 DIAGNOSIS — I701 Atherosclerosis of renal artery: Secondary | ICD-10-CM | POA: Diagnosis present

## 2022-11-24 DIAGNOSIS — L089 Local infection of the skin and subcutaneous tissue, unspecified: Secondary | ICD-10-CM | POA: Diagnosis present

## 2022-11-24 DIAGNOSIS — Z951 Presence of aortocoronary bypass graft: Secondary | ICD-10-CM | POA: Diagnosis not present

## 2022-11-24 DIAGNOSIS — Z79899 Other long term (current) drug therapy: Secondary | ICD-10-CM | POA: Diagnosis not present

## 2022-11-24 DIAGNOSIS — Z8673 Personal history of transient ischemic attack (TIA), and cerebral infarction without residual deficits: Secondary | ICD-10-CM | POA: Diagnosis not present

## 2022-11-24 DIAGNOSIS — Z6830 Body mass index (BMI) 30.0-30.9, adult: Secondary | ICD-10-CM

## 2022-11-24 DIAGNOSIS — Z794 Long term (current) use of insulin: Secondary | ICD-10-CM | POA: Diagnosis not present

## 2022-11-24 DIAGNOSIS — M19072 Primary osteoarthritis, left ankle and foot: Secondary | ICD-10-CM | POA: Diagnosis not present

## 2022-11-24 DIAGNOSIS — L97529 Non-pressure chronic ulcer of other part of left foot with unspecified severity: Secondary | ICD-10-CM | POA: Diagnosis not present

## 2022-11-24 DIAGNOSIS — Z7982 Long term (current) use of aspirin: Secondary | ICD-10-CM | POA: Diagnosis not present

## 2022-11-24 DIAGNOSIS — I70262 Atherosclerosis of native arteries of extremities with gangrene, left leg: Secondary | ICD-10-CM | POA: Diagnosis not present

## 2022-11-24 DIAGNOSIS — I70222 Atherosclerosis of native arteries of extremities with rest pain, left leg: Secondary | ICD-10-CM | POA: Diagnosis not present

## 2022-11-24 DIAGNOSIS — E1152 Type 2 diabetes mellitus with diabetic peripheral angiopathy with gangrene: Secondary | ICD-10-CM | POA: Diagnosis not present

## 2022-11-24 DIAGNOSIS — Z89431 Acquired absence of right foot: Secondary | ICD-10-CM

## 2022-11-24 DIAGNOSIS — I10 Essential (primary) hypertension: Secondary | ICD-10-CM | POA: Diagnosis not present

## 2022-11-24 DIAGNOSIS — Z7984 Long term (current) use of oral hypoglycemic drugs: Secondary | ICD-10-CM | POA: Diagnosis not present

## 2022-11-24 DIAGNOSIS — Z7902 Long term (current) use of antithrombotics/antiplatelets: Secondary | ICD-10-CM | POA: Diagnosis not present

## 2022-11-24 DIAGNOSIS — E782 Mixed hyperlipidemia: Secondary | ICD-10-CM | POA: Diagnosis not present

## 2022-11-24 DIAGNOSIS — M86172 Other acute osteomyelitis, left ankle and foot: Secondary | ICD-10-CM | POA: Diagnosis not present

## 2022-11-24 DIAGNOSIS — I96 Gangrene, not elsewhere classified: Secondary | ICD-10-CM | POA: Diagnosis not present

## 2022-11-24 DIAGNOSIS — L97524 Non-pressure chronic ulcer of other part of left foot with necrosis of bone: Secondary | ICD-10-CM | POA: Diagnosis not present

## 2022-11-24 DIAGNOSIS — E1169 Type 2 diabetes mellitus with other specified complication: Principal | ICD-10-CM | POA: Diagnosis present

## 2022-11-24 DIAGNOSIS — E785 Hyperlipidemia, unspecified: Secondary | ICD-10-CM | POA: Diagnosis not present

## 2022-11-24 DIAGNOSIS — I700 Atherosclerosis of aorta: Secondary | ICD-10-CM | POA: Diagnosis not present

## 2022-11-24 DIAGNOSIS — E11621 Type 2 diabetes mellitus with foot ulcer: Secondary | ICD-10-CM | POA: Diagnosis present

## 2022-11-24 DIAGNOSIS — E1142 Type 2 diabetes mellitus with diabetic polyneuropathy: Secondary | ICD-10-CM | POA: Diagnosis not present

## 2022-11-24 DIAGNOSIS — Z89412 Acquired absence of left great toe: Secondary | ICD-10-CM

## 2022-11-24 DIAGNOSIS — I2510 Atherosclerotic heart disease of native coronary artery without angina pectoris: Secondary | ICD-10-CM | POA: Diagnosis not present

## 2022-11-24 DIAGNOSIS — Z0389 Encounter for observation for other suspected diseases and conditions ruled out: Secondary | ICD-10-CM | POA: Diagnosis not present

## 2022-11-24 DIAGNOSIS — I70245 Atherosclerosis of native arteries of left leg with ulceration of other part of foot: Secondary | ICD-10-CM | POA: Diagnosis not present

## 2022-11-24 DIAGNOSIS — Z87891 Personal history of nicotine dependence: Secondary | ICD-10-CM

## 2022-11-24 DIAGNOSIS — Z89422 Acquired absence of other left toe(s): Secondary | ICD-10-CM | POA: Diagnosis not present

## 2022-11-24 DIAGNOSIS — M869 Osteomyelitis, unspecified: Principal | ICD-10-CM

## 2022-11-24 DIAGNOSIS — E669 Obesity, unspecified: Secondary | ICD-10-CM | POA: Diagnosis not present

## 2022-11-24 LAB — COMPREHENSIVE METABOLIC PANEL
ALT: 26 U/L (ref 0–44)
AST: 23 U/L (ref 15–41)
Albumin: 3.3 g/dL — ABNORMAL LOW (ref 3.5–5.0)
Alkaline Phosphatase: 88 U/L (ref 38–126)
Anion gap: 5 (ref 5–15)
BUN: 18 mg/dL (ref 8–23)
CO2: 27 mmol/L (ref 22–32)
Calcium: 8.7 mg/dL — ABNORMAL LOW (ref 8.9–10.3)
Chloride: 105 mmol/L (ref 98–111)
Creatinine, Ser: 0.83 mg/dL (ref 0.61–1.24)
GFR, Estimated: >60 mL/min (ref >60)
Glucose, Bld: 197 mg/dL — ABNORMAL HIGH (ref 70–99)
Potassium: 3.6 mmol/L (ref 3.5–5.1)
Sodium: 137 mmol/L (ref 135–145)
Total Bilirubin: 0.6 mg/dL (ref <1.2)
Total Protein: 7.3 g/dL (ref 6.5–8.1)

## 2022-11-24 LAB — CBC WITH DIFFERENTIAL/PLATELET
Abs Immature Granulocytes: 0.03 10*3/uL (ref 0.00–0.07)
Basophils Absolute: 0 10*3/uL (ref 0.0–0.1)
Basophils Relative: 0 %
Eosinophils Absolute: 0.2 10*3/uL (ref 0.0–0.5)
Eosinophils Relative: 3 %
HCT: 29.9 % — ABNORMAL LOW (ref 39.0–52.0)
Hemoglobin: 10 g/dL — ABNORMAL LOW (ref 13.0–17.0)
Immature Granulocytes: 0 %
Lymphocytes Relative: 16 %
Lymphs Abs: 1.2 10*3/uL (ref 0.7–4.0)
MCH: 29.2 pg (ref 26.0–34.0)
MCHC: 33.4 g/dL (ref 30.0–36.0)
MCV: 87.4 fL (ref 80.0–100.0)
Monocytes Absolute: 0.6 10*3/uL (ref 0.1–1.0)
Monocytes Relative: 8 %
Neutro Abs: 5.3 10*3/uL (ref 1.7–7.7)
Neutrophils Relative %: 73 %
Platelets: 214 10*3/uL (ref 150–400)
RBC: 3.42 MIL/uL — ABNORMAL LOW (ref 4.22–5.81)
RDW: 12.4 % (ref 11.5–15.5)
WBC: 7.3 10*3/uL (ref 4.0–10.5)
nRBC: 0 % (ref 0.0–0.2)

## 2022-11-24 LAB — LACTIC ACID, PLASMA
Lactic Acid, Venous: 1 mmol/L (ref 0.5–1.9)
Lactic Acid, Venous: 0.9 mmol/L (ref 0.5–1.9)

## 2022-11-24 LAB — GLUCOSE, CAPILLARY: Glucose-Capillary: 191 mg/dL — ABNORMAL HIGH (ref 70–99)

## 2022-11-24 MED ORDER — RAMIPRIL 10 MG PO CAPS
10.0000 mg | ORAL_CAPSULE | Freq: Every day | ORAL | Status: DC
  Filled 2022-11-24: qty 1

## 2022-11-24 MED ORDER — INSULIN GLARGINE-YFGN 100 UNIT/ML ~~LOC~~ SOLN
12.0000 [IU] | Freq: Every day | SUBCUTANEOUS | Status: AC
  Administered 2022-11-24: 12 [IU] via SUBCUTANEOUS
  Filled 2022-11-24: qty 0.12

## 2022-11-24 MED ORDER — INSULIN ASPART 100 UNIT/ML IJ SOLN
0.0000 [IU] | Freq: Every day | INTRAMUSCULAR | Status: DC
  Administered 2022-11-26: 2 [IU] via SUBCUTANEOUS
  Filled 2022-11-24: qty 1

## 2022-11-24 MED ORDER — ACETAMINOPHEN 325 MG PO TABS
650.0000 mg | ORAL_TABLET | Freq: Four times a day (QID) | ORAL | Status: DC | PRN
  Administered 2022-11-25: 650 mg via ORAL
  Filled 2022-11-24: qty 2

## 2022-11-24 MED ORDER — OXYCODONE HCL 5 MG PO TABS: 5.0000 mg | ORAL_TABLET | Freq: Four times a day (QID) | ORAL | Status: AC | PRN

## 2022-11-24 MED ORDER — SENNOSIDES-DOCUSATE SODIUM 8.6-50 MG PO TABS: 1.0000 | ORAL_TABLET | Freq: Every evening | ORAL | Status: DC | PRN

## 2022-11-24 MED ORDER — MELATONIN 5 MG PO TABS
5.0000 mg | ORAL_TABLET | Freq: Every evening | ORAL | Status: DC | PRN
  Administered 2022-11-25: 5 mg via ORAL
  Filled 2022-11-24: qty 1

## 2022-11-24 MED ORDER — VANCOMYCIN HCL 750 MG/150ML IV SOLN
750.0000 mg | Freq: Two times a day (BID) | INTRAVENOUS | Status: DC
  Administered 2022-11-25: 750 mg via INTRAVENOUS
  Filled 2022-11-24: qty 150

## 2022-11-24 MED ORDER — HYDRALAZINE HCL 20 MG/ML IJ SOLN
5.0000 mg | Freq: Four times a day (QID) | INTRAMUSCULAR | Status: DC | PRN
Start: 2022-11-24 — End: 2022-11-28
  Administered 2022-11-25 (×2): 5 mg via INTRAVENOUS
  Filled 2022-11-24 (×2): qty 1

## 2022-11-24 MED ORDER — SODIUM CHLORIDE 0.9 % IV SOLN
2.0000 g | Freq: Three times a day (TID) | INTRAVENOUS | Status: DC
  Administered 2022-11-24 – 2022-11-28 (×12): 2 g via INTRAVENOUS
  Filled 2022-11-24 (×15): qty 12.5

## 2022-11-24 MED ORDER — ONDANSETRON HCL 4 MG PO TABS: 4.0000 mg | ORAL_TABLET | Freq: Four times a day (QID) | ORAL | Status: DC | PRN

## 2022-11-24 MED ORDER — ONDANSETRON HCL 4 MG PO TABS
4.0000 mg | ORAL_TABLET | Freq: Four times a day (QID) | ORAL | Status: DC | PRN
Start: 2022-11-24 — End: 2022-11-24

## 2022-11-24 MED ORDER — ACETAMINOPHEN 650 MG RE SUPP: 650.0000 mg | Freq: Four times a day (QID) | RECTAL | Status: DC | PRN

## 2022-11-24 MED ORDER — ROSUVASTATIN CALCIUM 10 MG PO TABS
20.0000 mg | ORAL_TABLET | Freq: Every day | ORAL | Status: DC
  Administered 2022-11-27 – 2022-11-28 (×2): 20 mg via ORAL
  Filled 2022-11-24 (×2): qty 2

## 2022-11-24 MED ORDER — MORPHINE SULFATE (PF) 2 MG/ML IV SOLN: 2.0000 mg | INTRAVENOUS | Status: AC | PRN

## 2022-11-24 MED ORDER — VANCOMYCIN HCL IN DEXTROSE 1-5 GM/200ML-% IV SOLN
1000.0000 mg | Freq: Once | INTRAVENOUS | Status: AC
  Administered 2022-11-24: 1000 mg via INTRAVENOUS
  Filled 2022-11-24: qty 200

## 2022-11-24 MED ORDER — ONDANSETRON HCL 4 MG/2ML IJ SOLN: 4.0000 mg | Freq: Four times a day (QID) | INTRAMUSCULAR | Status: DC | PRN

## 2022-11-24 MED ORDER — INSULIN ASPART 100 UNIT/ML IJ SOLN
0.0000 [IU] | Freq: Three times a day (TID) | INTRAMUSCULAR | Status: DC
Start: 2022-11-25 — End: 2022-11-28
  Administered 2022-11-25 – 2022-11-26 (×3): 3 [IU] via SUBCUTANEOUS
  Administered 2022-11-27 (×2): 8 [IU] via SUBCUTANEOUS
  Administered 2022-11-27: 11 [IU] via SUBCUTANEOUS
  Administered 2022-11-28: 8 [IU] via SUBCUTANEOUS
  Administered 2022-11-28: 3 [IU] via SUBCUTANEOUS
  Filled 2022-11-24 (×8): qty 1

## 2022-11-24 NOTE — Hospital Course (Addendum)
Jason [name] is a 55 male with history of CAD, history of CVA of the basilar artery, hyperlipidemia, hypertension, who presents to the emergency department from podiatry clinic for chief concerns of left fifth toe osteomyelitis.  Vitals in the ED showed temperature 98, respiration rate of 14, heart rate of 65, blood pressure 146/113, SpO2 of 100% on room air.  Serum sodium is 137, potassium 3.6, chloride 105, bicarb 27, BUN of 18, serum creatinine of 0.83, EGFR greater than 60, nonfasting blood glucose 197, WBC 7.3, hemoglobin 10, platelets of 214.  ED treatment: Vancomycin per pharmacy.  Podiatry and vascular surgery was consulted.  ABI ordered.  11/14: Hemodynamically stable.  Going for lower extremity angiography and as needed intervention with vascular surgery today.  11/15: Mildly elevated blood pressure, s/p lower extremity angiography and 2 stents placed in left peroneal and left anterior tibial by vascular surgery yesterday, underwent left partial fifth ray amputation with podiatry today.  11/16: Blood pressure mildly elevated, added amlodipine to home ramipril.  Patient underwent hallux and fifth ray amputation by podiatry yesterday-tolerated the procedure well.  Podiatry changed the dressing and advised to be weightbearing on heel only and use surgical boot during ambulation.  PT evaluated him and recommended home health services which were ordered.  Intraoperative cultures with rare gram-positive cocci in pairs-pending final culture results.  Pathology pending.  Podiatry recommended discharging on Augmentin and doxycycline for 1 week and they will follow-up for final culture results as outpatient.  Patient will continue the rest of his home medications.  And follow-up with his providers for further management.

## 2022-11-24 NOTE — Progress Notes (Signed)
Pharmacy Antibiotic Note  Jason [name] is a 77 y.o. male admitted on 11/24/2022 with cellulitis. Patient presenting from outpatient podiatry with complaints of increased redness and swelling to left fifth toe concerning for diabetic foot ulcer/infection. PMH significant for T2DM and PVD s/p left great toe amputation and right transmetatarsal amputation. In ED, patient is afebrile with no leukocytosis. Pharmacy has been consulted for vancomycin and cefepime dosing.  Plan: Start cefepime 2 g IV every 8 hours Given vancomycin load of 2 g IV x1 in ED Start vancomycin 750 mg IV every 12 hours (eAUC 441.8, Cmin 12.7, Scr 0.83, IBW, Vd 0.5 L/kg) Monitor renal function, clinical status, and LOT F/u vascular plans for intervention  Height: 5\' 10"  (177.8 cm) Weight: 98 kg (216 lb) IBW/kg (Calculated) : 73  Temp (24hrs), Avg:98 F (36.7 C), Min:98 F (36.7 C), Max:98 F (36.7 C)  Recent Labs  Lab 11/24/22 1444  WBC 7.3  CREATININE 0.83  LATICACIDVEN 1.0    Estimated Creatinine Clearance: 88.9 mL/min (by C-G formula based on SCr of 0.83 mg/dL).    No Known Allergies  Antimicrobials this admission: vancomycin 11/13 >>  cefepime 11/13 >>   Dose adjustments this admission: N/A  Microbiology results: N/A  Thank you for involving pharmacy in this patient's care.   Rockwell Alexandria, PharmD Clinical Pharmacist 11/24/2022 6:08 PM

## 2022-11-24 NOTE — H&P (Signed)
History and Physical   Jason [name] Jason [name]:[id number] DOB: November 21, 1945 DOA: 11/24/2022  PCP: Marina Goodell, MD  Outpatient Specialists: Dr. Rosetta Posner podiatry Patient coming from: Podiatry  I have personally briefly reviewed patient's old medical records in [facility] EMR.  Chief Concern: Left fifth toe infection  HPI: Jason [name] is a 22 male with history of CAD, history of CVA of the basilar artery, hyperlipidemia, hypertension, who presents to the emergency department from podiatry clinic for chief concerns of left fifth toe osteomyelitis.  Vitals in the ED showed temperature 98, respiration rate of 14, heart rate of 65, blood pressure 146/113, SpO2 of 100% on room air.  Serum sodium is 137, potassium 3.6, chloride 105, bicarb 27, BUN of 18, serum creatinine of 0.83, EGFR greater than 60, nonfasting blood glucose 197, WBC 7.3, hemoglobin 10, platelets of 214.  ED treatment: Vancomycin per pharmacy. --------------------------------- At bedside, patient is able to tell me his name, his age, current calendar, and current location of [place] [facility].   He reports his left 5th toe started hurting on Monday. He reprots he noticed it turned red.   He denies vision changes, nausea, vomiting, chest pain, shortness of breath, fever, chills, abdominal pain, dysuria, hematuria, and diarrhea, and blood in his stool.   Social history: He has his own home, and his step daughter and her husband lives with him.  He is a former tobacco user, quitting after high school graduation. He denies etoh and recreational drug use. 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   ROS: Constitutional: no weight change, no fever ENT/Mouth: no sore throat, no rhinorrhea Eyes: no eye pain, no vision changes Cardiovascular: no chest pain, no dyspnea,  no edema, no palpitations Respiratory: no cough, no sputum, no  wheezing Gastrointestinal: no nausea, no vomiting, no diarrhea, no constipation Genitourinary: no urinary incontinence, no dysuria, no hematuria Musculoskeletal: no arthralgias, no myalgias Skin: no skin lesions, no pruritus, Neuro: + weakness, no loss of consciousness, no syncope Psych: no anxiety, no depression, no decrease appetite Heme/Lymph: no bruising, no bleeding  ED Course: Discussed with EDP, patient requiring hospitalization for chief concerns of osteomyelitis of the left fifth toe.  Assessment/Plan  Principal Problem:   Foot osteomyelitis, left (HCC) Active Problems:   History of CVA (cerebrovascular accident)   CAD (coronary artery disease)   Essential hypertension   Type 2 diabetes mellitus with hyperlipidemia (HCC)   PVD (peripheral vascular disease) (HCC)   Hyperlipidemia, mixed   Assessment and Plan:  * Foot osteomyelitis, left (HCC) Continue vancomycin and cefepime Per EDP, Dr. Excell Seltzer did outpatient x-ray which was read as concerning for early onset osteomyelitis in Care Everywhere I have ordered ABI on admission Dr. Excell Seltzer has consulted vascular surgery pending their recommendations Podiatry epic order placed Admit to telemetry medical, inpatient  Hyperlipidemia, mixed Home rosuvastatin 20 mg daily resumed  PVD (peripheral vascular disease) (HCC) ABI of the lower extremity ordered  Type 2 diabetes mellitus with hyperlipidemia (HCC) Insulin dependent diabetes mellitus with at bedtime coverage ordered Home long-acting insulin 25 units nightly not resumed on admission On day of admission I have ordered 12 units subcutaneous daily nightly, one-time dose ordered in anticipation of possible vascular or podiatry surgery in the a.m. AM team to resume home long-acting insulin as appropriate Goal inpatient blood glucose levels 140-180  Essential hypertension Ramipril 10 mg daily resumed on admission Hydralazine 5 mg every  6 hours as needed for SBP greater than  160, 4 days ordered  History of CVA (cerebrovascular accident) Home aspirin and Plavix not resumed on admission  Symptomatic support: Acetaminophen 650 milligrams p.o./rectal every 6 hours as needed for mild pain, fever, headache, 5 days of coverage ordered Ondansetron 4 mg IV every 6 hours as needed for nausea and vomiting ordered Oxycodone immediate release 5 mg every 6 hours as needed for moderate pain, 1 day coverage ordered; morphine 2 mg IV every 4 hours as needed for severe pain, 20 hours of coverage ordered  Chart reviewed.   DVT prophylaxis: TED hose; AM team to initiate pharmacologic DVT prophylaxis when the benefits outweigh the risk Code Status: full code Diet: Heart healthy/carb modified; n.p.o. after midnight Family Communication: a phone call was offered, patient declined stating he already updated his son already.  Disposition Plan: Pending clinical course Consults called: Podiatry; and vascular was consulted by podiatry specialist Admission status: telemetry medical, inpatient  Past Medical History:  Diagnosis Date   Arthritis    CAD (coronary artery disease)    Colon polyp    Diabetes mellitus without complication (HCC)    Hyperlipemia    Hypertension    Orchitis    PVD (peripheral vascular disease) (HCC)    Stroke (cerebrum) (HCC)    Tremor    Wears dentures    Past Surgical History:  Procedure Laterality Date   ACHILLES TENDON SURGERY Right 04/02/2020   Procedure: ACHILLES LENGTHENING;  Surgeon: Rosetta Posner, DPM;  Location: [facility] ORS;  Service: Podiatry;  Laterality: Right;   AMPUTATION Right 08/21/2016   Procedure: RAY RESECTION-RIGHT 5TH RAY;  Surgeon: Linus Galas, DPM;  Location: [facility] ORS;  Service: Podiatry;  Laterality: Right;   AMPUTATION TOE Left    big   BONE EXOSTOSIS EXCISION Right 06/25/2021   Procedure: EXOSTOSIS RESECTION;  Surgeon: Rosetta Posner, DPM;  Location: [facility];  Service: Podiatry;  Laterality: Right;   CAROTID  ENDARTERECTOMY Right    COLON SURGERY     CORONARY ARTERY BYPASS GRAFT     LOWER EXTREMITY ANGIOGRAPHY Right 03/31/2020   Procedure: Lower Extremity Angiography;  Surgeon: Annice Needy, MD;  Location: [facility] INVASIVE CV LAB;  Service: Cardiovascular;  Laterality: Right;   ROTATOR CUFF REPAIR Right    TRANSMETATARSAL AMPUTATION Right 04/02/2020   Procedure: TRANSMETATARSAL AMPUTATION;  Surgeon: Rosetta Posner, DPM;  Location: [facility] ORS;  Service: Podiatry;  Laterality: Right;   Social History:  reports that he has never smoked. He has never used smokeless tobacco. He reports that he does not drink alcohol and does not use drugs.  No Known Allergies Family History  Problem Relation Age of Onset   Diabetes Mother    Diabetes Father    Family history: Family history reviewed and not pertinent.  Prior to Admission medications   Medication Sig Start Date End Date Taking? Authorizing Provider  aspirin 81 MG tablet Take 81 mg by mouth daily.    [provider]  Calcium Carbonate-Vit D-Min (CALCIUM 1200 PO) Take by mouth daily.    [provider]  clopidogrel (PLAVIX) 75 MG tablet Take 75 mg by mouth daily.    [provider]  Continuous Blood Gluc Receiver (FREESTYLE LIBRE 14 DAY READER) DEVI Use 1 Device as directed 05/15/19   [provider]  Continuous Blood Gluc Receiver (FREESTYLE LIBRE 14 DAY READER) DEVI See admin instructions. 05/19/19   [provider]  Continuous Blood Gluc Receiver (FREESTYLE LIBRE 14 DAY READER)  DEVI 1 Device. 05/15/19   [provider]  Continuous Blood Gluc Sensor (FREESTYLE LIBRE 14 DAY SENSOR) MISC SMARTSIG:1 Kit(s) Topical Every 2 Weeks 02/21/20   [provider]  docusate sodium (COLACE) 100 MG capsule Take 1 capsule (100 mg total) by mouth 2 (two) times daily as needed for mild constipation. 08/24/16   Altamese Dilling, MD  gabapentin (NEURONTIN) 300 MG capsule Take 1 capsule (300 mg total) by mouth 3  (three) times daily for 14 days. Patient not taking: Reported on 06/12/2021 04/05/20 04/19/20  Lolita Patella B, MD  glimepiride (AMARYL) 4 MG tablet Take 4 mg by mouth 2 (two) times daily.     [provider]  insulin glargine (LANTUS) 100 UNIT/ML injection Inject 30 Units into the skin at bedtime.    [provider]  insulin regular (NOVOLIN R,HUMULIN R) 100 units/mL injection Inject 5-10 Units into the skin 3 (three) times daily before meals.     [provider]  Magnesium 250 MG TABS Take by mouth.    [provider]  metFORMIN (GLUCOPHAGE) 1000 MG tablet Take 1,000 mg by mouth 2 (two) times daily with a meal.    [provider]  Multiple Vitamin (MULTIVITAMIN) capsule Take 1 capsule by mouth daily.    [provider]  Potassium 99 MG TABS Take by mouth daily.    [provider]  povidone-iodine (BETADINE) 10 % external solution To apply and clean the wound 3 times a week. 08/24/16   Altamese Dilling, MD  pravastatin (PRAVACHOL) 40 MG tablet Take 40 mg by mouth daily. Patient not taking: Reported on 06/12/2021    [provider]  propranolol (INDERAL) 40 MG tablet Take 1 tablet (40 mg total) by mouth 3 (three) times daily. 08/24/16   Altamese Dilling, MD  ramipril (ALTACE) 10 MG capsule Take 10 mg by mouth daily.    [provider]  rosuvastatin (CRESTOR) 20 MG tablet Take by mouth. 12/10/19 06/25/21  [provider]   Physical Exam: Vitals:   11/24/22 1436 11/24/22 1437 11/24/22 1805 11/24/22 2001  BP: (!) 146/113  (!) 179/76 (!) 169/72  Pulse: 65  79 62  Resp: 14  18 19   Temp: 98 F (36.7 C)  98 F (36.7 C) 97.9 F (36.6 C)  TempSrc: Oral  Oral Oral  SpO2: 100%  99% 97%  Weight:  98 kg    Height:  5\' 10"  (1.778 m)     Constitutional: appears age-appropriate, NAD, calm Eyes: PERRL, lids and conjunctivae normal ENMT: Mucous membranes are moist. Posterior pharynx clear of any exudate or  lesions. Age-appropriate dentition. Hearing appropriate Neck: normal, supple, no masses, no thyromegaly Respiratory: clear to auscultation bilaterally, no wheezing, no crackles. Normal respiratory effort. No accessory muscle use.  Cardiovascular: Regular rate and rhythm, no murmurs / rubs / gallops. No extremity edema. 2+ pedal pulses. No carotid bruits.  Abdomen: Obese abdomen, no tenderness, no masses palpated, no hepatosplenomegaly. Bowel sounds positive.  Musculoskeletal: no clubbing / cyanosis. No joint deformity upper and lower extremities. Good ROM, no contractures, no atrophy. Normal muscle tone.  Skin: no rashes, lesions, ulcers. No induration Neurologic: Sensation intact. Strength 5/5 in all 4.  Psychiatric: Normal judgment and insight. Alert and oriented x 3. Normal mood.   EKG: Not indicated at this time  Chest x-ray on Admission: I personally reviewed and I agree with radiologist reading as below.  DG Chest Port 1 View  Result Date: 11/24/2022 CLINICAL DATA:  95621 Infection  [id number] EXAM: PORTABLE CHEST 1 VIEW COMPARISON:  None Available. FINDINGS: The heart and mediastinal contours are within normal limits. Atherosclerotic plaque. No focal consolidation. No pulmonary edema. No pleural effusion. No pneumothorax. No acute osseous abnormality. Sternotomy wires are intact. IMPRESSION: 1. No active disease. 2.  Aortic Atherosclerosis (ICD10-I70.0). Electronically Signed   By: Tish Frederickson M.D.   On: 11/24/2022 17:20    Labs on Admission: I have personally reviewed following labs  CBC: Recent Labs  Lab 11/24/22 1444  WBC 7.3  NEUTROABS 5.3  HGB 10.0*  HCT 29.9*  MCV 87.4  PLT 214   Basic Metabolic Panel: Recent Labs  Lab 11/24/22 1444  NA 137  K 3.6  CL 105  CO2 27  GLUCOSE 197*  BUN 18  CREATININE 0.83  CALCIUM 8.7*   GFR: Estimated Creatinine Clearance: 88.9 mL/min (by C-G formula based on SCr of 0.83 mg/dL).  Liver Function Tests: Recent Labs  Lab  11/24/22 1444  AST 23  ALT 26  ALKPHOS 88  BILITOT 0.6  PROT 7.3  ALBUMIN 3.3*   This document was prepared using Dragon Voice Recognition software and may include unintentional dictation errors.  Dr. Sedalia Muta [facility]  If 7PM-7AM, please contact overnight-coverage provider If 7AM-7PM, please contact day attending provider [email or web address]  11/24/2022, 8:20 PM

## 2022-11-24 NOTE — Assessment & Plan Note (Signed)
Insulin dependent diabetes mellitus with at bedtime coverage ordered Home long-acting insulin 25 units nightly not resumed on admission On day of admission I have ordered 12 units subcutaneous daily nightly, one-time dose ordered in anticipation of possible vascular or podiatry surgery in the a.m. AM team to resume home long-acting insulin as appropriate Goal inpatient blood glucose levels 140-180

## 2022-11-24 NOTE — H&P (View-Only) (Signed)
Hospital Consult    Reason for Consult:  Left Lower Extremity non healing ulcer Requesting Physician:  Dr Rosetta Posner MD MRN #:  [id number]  History of Present Illness: This is a 77 y.o. male who presents to [facility] emergency department from the podiatry clinic for chief concerns of left fifth toe osteomyelitis.  Patient has a prior history of right transmetatarsal amputation across all toes.  Patient endorses he had problems with his small toe on his right foot that started his difficulties leading to a transmetatarsal amputation and now it is occurring on the left side.  He endorses he understands Dr. Excell Seltzer is asking for vascular surgery's evaluation and possible angiogram to evaluate blood flow and healing capabilities.  Patient has no other complaints overnight and vitals all remained stable.  Past Medical History:  Diagnosis Date   Arthritis    CAD (coronary artery disease)    Colon polyp    Diabetes mellitus without complication (HCC)    Hyperlipemia    Hypertension    Orchitis    PVD (peripheral vascular disease) (HCC)    Stroke (cerebrum) (HCC)    Tremor    Wears dentures     Past Surgical History:  Procedure Laterality Date   ACHILLES TENDON SURGERY Right 04/02/2020   Procedure: ACHILLES LENGTHENING;  Surgeon: Rosetta Posner, DPM;  Location: [facility] OR[facility];  Service: Podiatry;  Laterality: Right;   AMPUTATION Right 08/21/2016   Procedure: RAY RESECTION-RIGHT 5TH RAY;  Surgeon: Linus Galas, DPM;  Location: [facility] OR[facility];  Service: Podiatry;  Laterality: Right;   AMPUTATION TOE Left    big   BONE EXOSTOSIS EXCISION Right 06/25/2021   Procedure: EXOSTOSIS RESECTION;  Surgeon: Rosetta Posner, DPM;  Location: [facility];  Service: Podiatry;  Laterality: Right;   CAROTID ENDARTERECTOMY Right    COLON SURGERY     CORONARY ARTERY BYPASS GRAFT     LOWER EXTREMITY ANGIOGRAPHY Right 03/31/2020   Procedure: Lower Extremity Angiography;  Surgeon: Annice Needy, MD;  Location: [facility] INVASIVE  CV LAB;  Service: Cardiovascular;  Laterality: Right;   ROTATOR CUFF REPAIR Right    TRANSMETATARSAL AMPUTATION Right 04/02/2020   Procedure: TRANSMETATARSAL AMPUTATION;  Surgeon: Rosetta Posner, DPM;  Location: [facility] ORS;  Service: Podiatry;  Laterality: Right;    No Known Allergies  Prior to Admission medications   Medication Sig Start Date End Date Taking? Authorizing Provider  aspirin 81 MG tablet Take 81 mg by mouth daily.    [provider]  Calcium Carbonate-Vit D-Min (CALCIUM 1200 PO) Take by mouth daily.    [provider]  clopidogrel (PLAVIX) 75 MG tablet Take 75 mg by mouth daily.    [provider]  Continuous Blood Gluc Receiver (FREESTYLE LIBRE 14 DAY READER) DEVI Use 1 Device as directed 05/15/19   [provider]  Continuous Blood Gluc Receiver (FREESTYLE LIBRE 14 DAY READER) DEVI See admin instructions. 05/19/19   [provider]  Continuous Blood Gluc Receiver (FREESTYLE LIBRE 14 DAY READER) DEVI 1 Device. 05/15/19   [provider]  Continuous Blood Gluc Sensor (FREESTYLE LIBRE 14 DAY SENSOR) MISC SMARTSIG:1 Kit(s) Topical Every 2 Weeks 02/21/20   [provider]  docusate sodium (COLACE) 100 MG capsule Take 1 capsule (100 mg total) by mouth 2 (two) times daily as needed for mild constipation. 08/24/16   Altamese Dilling, MD  gabapentin (NEURONTIN) 300 MG capsule Take 1 capsule (300 mg total) by mouth 3 (three) times daily for 14 days. Patient not taking: Reported  on 06/12/2021 04/05/20 04/19/20  Tresa Moore, MD  glimepiride (AMARYL) 4 MG tablet Take 4 mg by mouth 2 (two) times daily.     [provider]  insulin glargine (LANTUS) 100 UNIT/ML injection Inject 30 Units into the skin at bedtime.    [provider]  insulin regular (NOVOLIN R,HUMULIN R) 100 units/mL injection Inject 5-10 Units into the skin 3 (three) times daily before meals.     [provider]  Magnesium 250 MG TABS Take  by mouth.    [provider]  metFORMIN (GLUCOPHAGE) 1000 MG tablet Take 1,000 mg by mouth 2 (two) times daily with a meal.    [provider]  Multiple Vitamin (MULTIVITAMIN) capsule Take 1 capsule by mouth daily.    [provider]  Potassium 99 MG TABS Take by mouth daily.    [provider]  povidone-iodine (BETADINE) 10 % external solution To apply and clean the wound 3 times a week. 08/24/16   Altamese Dilling, MD  pravastatin (PRAVACHOL) 40 MG tablet Take 40 mg by mouth daily. Patient not taking: Reported on 06/12/2021    [provider]  propranolol (INDERAL) 40 MG tablet Take 1 tablet (40 mg total) by mouth 3 (three) times daily. 08/24/16   Altamese Dilling, MD  ramipril (ALTACE) 10 MG capsule Take 10 mg by mouth daily.    [provider]  rosuvastatin (CRESTOR) 20 MG tablet Take by mouth. 12/10/19 06/25/21  [provider]    Social History   Socioeconomic History   Marital status: Widowed    Spouse name: Not on file   Number of children: Not on file   Years of education: Not on file   Highest education level: Not on file  Occupational History   Occupation: sales associate  Tobacco Use   Smoking status: Never   Smokeless tobacco: Never  Substance and Sexual Activity   Alcohol use: No   Drug use: No   Sexual activity: Not on file  Other Topics Concern   Not on file  Social History Narrative   Not on file   Social Determinants of Health   Financial Resource Strain: Not on file  Food Insecurity: Not on file  Transportation Needs: Not on file  Physical Activity: Not on file  Stress: Not on file  Social Connections: Not on file  Intimate Partner Violence: Not on file     Family History  Problem Relation Age of Onset   Diabetes Mother    Diabetes Father     ROS: Otherwise negative unless mentioned in HPI  Physical Examination  Vitals:   11/24/22 1436  BP: (!) 146/113  Pulse: 65  Resp:  14  Temp: 98 F (36.7 C)  SpO2: 100%   Body mass index is 30.99 kg/m.  General:  WDWN in NAD Gait: Not observed HENT: WNL, normocephalic Pulmonary: normal non-labored breathing, without Rales, rhonchi,  wheezing Cardiac: regular, without  Murmurs, rubs or gallops; with carotid bruits Abdomen: Positive bowel sounds throughout, soft, NT/ND, no masses Skin: without rashes Vascular Exam/Pulses: Unable to palpate pulses in bilateral lower extremity DP/PT.  Bilateral lower extremities remain Extremities: with ischemic changes, without Gangrene , with cellulitis; without open wounds;  Musculoskeletal: no muscle wasting or atrophy  Neurologic: A&O X 3;  No focal weakness or paresthesias are detected; speech is fluent/normal Psychiatric:  The pt has Normal affect. Lymph:  Unremarkable  CBC    Component Value Date/Time   WBC 7.3 11/24/2022 1444  RBC 3.42 (L) 11/24/2022 1444   HGB 10.0 (L) 11/24/2022 1444   HCT 29.9 (L) 11/24/2022 1444   PLT 214 11/24/2022 1444   MCV 87.4 11/24/2022 1444   MCH 29.2 11/24/2022 1444   MCHC 33.4 11/24/2022 1444   RDW 12.4 11/24/2022 1444   LYMPHSABS 1.2 11/24/2022 1444   MONOABS 0.6 11/24/2022 1444   EOSABS 0.2 11/24/2022 1444   BASOSABS 0.0 11/24/2022 1444    BMET    Component Value Date/Time   NA 137 11/24/2022 1444   K 3.6 11/24/2022 1444   CL 105 11/24/2022 1444   CO2 27 11/24/2022 1444   GLUCOSE 197 (H) 11/24/2022 1444   BUN 18 11/24/2022 1444   CREATININE 0.83 11/24/2022 1444   CALCIUM 8.7 (L) 11/24/2022 1444   GFRNONAA >60 11/24/2022 1444   GFRAA >60 08/23/2016 0409    COAGS: No results found for: "INR", "PROTIME"   Non-Invasive Vascular Imaging:   Ultrasound of Left Lower Extremity with ABI's Ordered. No results at this time.   Statin:  Yes.   Beta Blocker:  No. Aspirin:  Yes.   ACEI:  No. ARB:  No. CCB use:  No Other antiplatelets/anticoagulants:  Yes.   Plavix 75 mg Daily.    ASSESSMENT/PLAN: This is a 77 y.o. male  who presents to [facility] emergency department from podiatry clinic for concerns of a left fifth toe osteomyelitis.  Vascular surgery was consulted to assess as the patient's had a history of PAD on the right with transmetatarsal amputation. Patient's history includes longstanding diabetes mellitus 2.  Vascular surgery plans on taking the patient to the vascular lab today on 11/25/2022 for a left lower extremity angiogram with possible intervention.  I had a long discussion at the bedside with the patient regarding the procedure, benefits, risk and complications.  Patient verbalizes understanding and wishes to proceed.  I answered all the patient's questions this morning.  Patient endorses he has been n.p.o. since midnight last night.    -I discussed the plan with Dr. Festus Barren MD and he is in agreement with the plan   Marcie Bal [facility] 11/24/2022 5:16 PM

## 2022-11-24 NOTE — Assessment & Plan Note (Signed)
Patient is going for lower extremity angiography by vascular surgery today -Holding aspirin and Plavix which can be resumed after procedure or as recommended by vascular surgery -Continue statin

## 2022-11-24 NOTE — ED Triage Notes (Signed)
Pt referred to ED from podiatry where he is being seen for diabetic ulcer on L pinky toe. Per chart review pt has osteomyelitis and potential amputation. No recent fevers per pt. Pt currently on antibiotics.

## 2022-11-24 NOTE — Consult Note (Signed)
Hospital Consult    Reason for Consult:  Left Lower Extremity non healing ulcer Requesting Physician:  Dr Rosetta Posner MD MRN #:  [id number]  History of Present Illness: This is a 77 y.o. male who presents to [facility] emergency department from the podiatry clinic for chief concerns of left fifth toe osteomyelitis.  Patient has a prior history of right transmetatarsal amputation across all toes.  Patient endorses he had problems with his small toe on his right foot that started his difficulties leading to a transmetatarsal amputation and now it is occurring on the left side.  He endorses he understands Dr. Excell Seltzer is asking for vascular surgery's evaluation and possible angiogram to evaluate blood flow and healing capabilities.  Patient has no other complaints overnight and vitals all remained stable.  Past Medical History:  Diagnosis Date   Arthritis    CAD (coronary artery disease)    Colon polyp    Diabetes mellitus without complication (HCC)    Hyperlipemia    Hypertension    Orchitis    PVD (peripheral vascular disease) (HCC)    Stroke (cerebrum) (HCC)    Tremor    Wears dentures     Past Surgical History:  Procedure Laterality Date   ACHILLES TENDON SURGERY Right 04/02/2020   Procedure: ACHILLES LENGTHENING;  Surgeon: Rosetta Posner, DPM;  Location: [facility] ORS;  Service: Podiatry;  Laterality: Right;   AMPUTATION Right 08/21/2016   Procedure: RAY RESECTION-RIGHT 5TH RAY;  Surgeon: Linus Galas, DPM;  Location: [facility] ORS;  Service: Podiatry;  Laterality: Right;   AMPUTATION TOE Left    big   BONE EXOSTOSIS EXCISION Right 06/25/2021   Procedure: EXOSTOSIS RESECTION;  Surgeon: Rosetta Posner, DPM;  Location: [facility];  Service: Podiatry;  Laterality: Right;   CAROTID ENDARTERECTOMY Right    COLON SURGERY     CORONARY ARTERY BYPASS GRAFT     LOWER EXTREMITY ANGIOGRAPHY Right 03/31/2020   Procedure: Lower Extremity Angiography;  Surgeon: Annice Needy, MD;  Location: [facility] INVASIVE  CV LAB;  Service: Cardiovascular;  Laterality: Right;   ROTATOR CUFF REPAIR Right    TRANSMETATARSAL AMPUTATION Right 04/02/2020   Procedure: TRANSMETATARSAL AMPUTATION;  Surgeon: Rosetta Posner, DPM;  Location: [facility] ORS;  Service: Podiatry;  Laterality: Right;    No Known Allergies  Prior to Admission medications   Medication Sig Start Date End Date Taking? Authorizing Provider  aspirin 81 MG tablet Take 81 mg by mouth daily.    [provider]  Calcium Carbonate-Vit D-Min (CALCIUM 1200 PO) Take by mouth daily.    [provider]  clopidogrel (PLAVIX) 75 MG tablet Take 75 mg by mouth daily.    [provider]  Continuous Blood Gluc Receiver (FREESTYLE LIBRE 14 DAY READER) DEVI Use 1 Device as directed 05/15/19   [provider]  Continuous Blood Gluc Receiver (FREESTYLE LIBRE 14 DAY READER) DEVI See admin instructions. 05/19/19   [provider]  Continuous Blood Gluc Receiver (FREESTYLE LIBRE 14 DAY READER) DEVI 1 Device. 05/15/19   [provider]  Continuous Blood Gluc Sensor (FREESTYLE LIBRE 14 DAY SENSOR) MISC SMARTSIG:1 Kit(s) Topical Every 2 Weeks 02/21/20   [provider]  docusate sodium (COLACE) 100 MG capsule Take 1 capsule (100 mg total) by mouth 2 (two) times daily as needed for mild constipation. 08/24/16   Jason Dilling, MD  gabapentin (NEURONTIN) 300 MG capsule Take 1 capsule (300 mg total) by mouth 3 (three) times daily for 14 days. Patient not taking: Reported  on 06/12/2021 04/05/20 04/19/20  Tresa Moore, MD  glimepiride (AMARYL) 4 MG tablet Take 4 mg by mouth 2 (two) times daily.     [provider]  insulin glargine (LANTUS) 100 UNIT/ML injection Inject 30 Units into the skin at bedtime.    [provider]  insulin regular (NOVOLIN R,HUMULIN R) 100 units/mL injection Inject 5-10 Units into the skin 3 (three) times daily before meals.     [provider]  Magnesium 250 MG TABS Take  by mouth.    [provider]  metFORMIN (GLUCOPHAGE) 1000 MG tablet Take 1,000 mg by mouth 2 (two) times daily with a meal.    [provider]  Multiple Vitamin (MULTIVITAMIN) capsule Take 1 capsule by mouth daily.    [provider]  Potassium 99 MG TABS Take by mouth daily.    [provider]  povidone-iodine (BETADINE) 10 % external solution To apply and clean the wound 3 times a week. 08/24/16   Jason Dilling, MD  pravastatin (PRAVACHOL) 40 MG tablet Take 40 mg by mouth daily. Patient not taking: Reported on 06/12/2021    [provider]  propranolol (INDERAL) 40 MG tablet Take 1 tablet (40 mg total) by mouth 3 (three) times daily. 08/24/16   Jason Dilling, MD  ramipril (ALTACE) 10 MG capsule Take 10 mg by mouth daily.    [provider]  rosuvastatin (CRESTOR) 20 MG tablet Take by mouth. 12/10/19 06/25/21  [provider]    Social History   Socioeconomic History   Marital status: Widowed    Spouse name: Not on file   Number of children: Not on file   Years of education: Not on file   Highest education level: Not on file  Occupational History   Occupation: sales associate  Tobacco Use   Smoking status: Never   Smokeless tobacco: Never  Substance and Sexual Activity   Alcohol use: No   Drug use: No   Sexual activity: Not on file  Other Topics Concern   Not on file  Social History Narrative   Not on file   Social Determinants of Health   Financial Resource Strain: Not on file  Food Insecurity: Not on file  Transportation Needs: Not on file  Physical Activity: Not on file  Stress: Not on file  Social Connections: Not on file  Intimate Partner Violence: Not on file     Family History  Problem Relation Age of Onset   Diabetes Mother    Diabetes Father     ROS: Otherwise negative unless mentioned in HPI  Physical Examination  Vitals:   11/24/22 1436  BP: (!) 146/113  Pulse: 65  Resp:  14  Temp: 98 F (36.7 C)  SpO2: 100%   Body mass index is 30.99 kg/m.  General:  WDWN in NAD Gait: Not observed HENT: WNL, normocephalic Pulmonary: normal non-labored breathing, without Rales, rhonchi,  wheezing Cardiac: regular, without  Murmurs, rubs or gallops; with carotid bruits Abdomen: Positive bowel sounds throughout, soft, NT/ND, no masses Skin: without rashes Vascular Exam/Pulses: Unable to palpate pulses in bilateral lower extremity DP/PT.  Bilateral lower extremities remain Extremities: with ischemic changes, without Gangrene , with cellulitis; without open wounds;  Musculoskeletal: no muscle wasting or atrophy  Neurologic: A&O X 3;  No focal weakness or paresthesias are detected; speech is fluent/normal Psychiatric:  The pt has Normal affect. Lymph:  Unremarkable  CBC    Component Value Date/Time   WBC 7.3 11/24/2022 1444  RBC 3.42 (L) 11/24/2022 1444   HGB 10.0 (L) 11/24/2022 1444   HCT 29.9 (L) 11/24/2022 1444   PLT 214 11/24/2022 1444   MCV 87.4 11/24/2022 1444   MCH 29.2 11/24/2022 1444   MCHC 33.4 11/24/2022 1444   RDW 12.4 11/24/2022 1444   LYMPHSABS 1.2 11/24/2022 1444   MONOABS 0.6 11/24/2022 1444   EOSABS 0.2 11/24/2022 1444   BASOSABS 0.0 11/24/2022 1444    BMET    Component Value Date/Time   NA 137 11/24/2022 1444   K 3.6 11/24/2022 1444   CL 105 11/24/2022 1444   CO2 27 11/24/2022 1444   GLUCOSE 197 (H) 11/24/2022 1444   BUN 18 11/24/2022 1444   CREATININE 0.83 11/24/2022 1444   CALCIUM 8.7 (L) 11/24/2022 1444   GFRNONAA >60 11/24/2022 1444   GFRAA >60 08/23/2016 0409    COAGS: No results found for: "INR", "PROTIME"   Non-Invasive Vascular Imaging:   Ultrasound of Left Lower Extremity with ABI's Ordered. No results at this time.   Statin:  Yes.   Beta Blocker:  No. Aspirin:  Yes.   ACEI:  No. ARB:  No. CCB use:  No Other antiplatelets/anticoagulants:  Yes.   Plavix 75 mg Daily.    ASSESSMENT/PLAN: This is a 77 y.o. male  who presents to [facility] emergency department from podiatry clinic for concerns of a left fifth toe osteomyelitis.  Vascular surgery was consulted to assess as the patient's had a history of PAD on the right with transmetatarsal amputation. Patient's history includes longstanding diabetes mellitus 2.  Vascular surgery plans on taking the patient to the vascular lab today on 11/25/2022 for a left lower extremity angiogram with possible intervention.  I had a long discussion at the bedside with the patient regarding the procedure, benefits, risk and complications.  Patient verbalizes understanding and wishes to proceed.  I answered all the patient's questions this morning.  Patient endorses he has been n.p.o. since midnight last night.    -I discussed the plan with Dr. Festus Barren MD and he is in agreement with the plan   Marcie Bal [facility] 11/24/2022 5:16 PM

## 2022-11-24 NOTE — Assessment & Plan Note (Signed)
Ramipril 10 mg daily resumed on admission Hydralazine 5 mg every 6 hours as needed for SBP greater than 160, 4 days ordered

## 2022-11-24 NOTE — Assessment & Plan Note (Addendum)
Podiatry and vascular surgery was consulted, patient is going for lower extremity angiography by vascular surgery and likely will need amputation afterwards Continue vancomycin and cefepime Follow-up vascular surgery and podiatry recommendation Continue with supportive care

## 2022-11-24 NOTE — Assessment & Plan Note (Signed)
Home aspirin and Plavix not resumed on admission, as patient will likely need a procedure.

## 2022-11-24 NOTE — ED Provider Notes (Signed)
[facility] Provider Note    Event Date/Time   First MD Initiated Contact with Patient 11/24/22 1612     (approximate)   History   Wound Check   HPI  Jason [name] is a 77 y.o. male who presents to the emergency department today at the advice of podiatry because of concerns for a left fifth toe infection.  The patient states that he noticed some redness and swelling a few days ago.  He had a wound to the left side of his foot that started a few days before that.  Patient does have a history of diabetes and has had prior toe amputations.  Patient denies any fevers, nausea or vomiting.  He was started on oral antibiotics.     Physical Exam   Triage Vital Signs: ED Triage Vitals  Encounter Vitals Group     BP 11/24/22 1436 (!) 146/113     Systolic BP Percentile --      Diastolic BP Percentile --      Pulse Rate 11/24/22 1436 65     Resp 11/24/22 1436 14     Temp 11/24/22 1436 98 F (36.7 C)     Temp Source 11/24/22 1436 Oral     SpO2 11/24/22 1436 100 %     Weight 11/24/22 1437 216 lb (98 kg)     Height 11/24/22 1437 5\' 10"  (1.778 m)     Head Circumference --      Peak Flow --      Pain Score 11/24/22 1436 1     Pain Loc --      Pain Education --      Exclude from Growth Chart --     Most recent vital signs: Vitals:   11/24/22 1436  BP: (!) 146/113  Pulse: 65  Resp: 14  Temp: 98 F (36.7 C)  SpO2: 100%   General: Awake, alert, oriented. CV:  Good peripheral perfusion.  Resp:  Normal effort.  Abd:  No distention.  Other:  Left fifth toe red, swollen, ulcer to left lateral foot.   ED Results / Procedures / Treatments   Labs (all labs ordered are listed, but only abnormal results are displayed) Labs Reviewed  COMPREHENSIVE METABOLIC PANEL - Abnormal; Notable for the following components:      Result Value   Glucose, Bld 197 (*)    Calcium 8.7 (*)    Albumin 3.3 (*)    All other components within normal limits  CBC WITH  DIFFERENTIAL/PLATELET - Abnormal; Notable for the following components:   RBC 3.42 (*)    Hemoglobin 10.0 (*)    HCT 29.9 (*)    All other components within normal limits  LACTIC ACID, PLASMA  LACTIC ACID, PLASMA  URINALYSIS, W/ REFLEX TO CULTURE (INFECTION SUSPECTED)     EKG  None   RADIOLOGY I independently interpreted and visualized the CXR. My interpretation: No pneumonia Radiology interpretation:  IMPRESSION:  1. No active disease.  2.  Aortic Atherosclerosis (ICD10-I70.0).      PROCEDURES:  Critical Care performed: No   MEDICATIONS ORDERED IN ED: Medications - No data to display   IMPRESSION / MDM / ASSESSMENT AND PLAN / ED COURSE  I reviewed the triage vital signs and the nursing notes.                              Differential diagnosis includes, but is not limited  to, cellulitis, osteomyelitis  Patient's presentation is most consistent with acute presentation with potential threat to life or bodily function.   Patient presented to the emergency department today sent from podiatry for concerns for left fifth lower digit infection.  Patient states that he had x-rays done at podiatry clinic and was told he has infection of his bone.  Will start IV fluids.  Discussed with Dr. Sedalia Muta with the hospital service to evaluate for admission.      FINAL CLINICAL IMPRESSION(S) / ED DIAGNOSES   Final diagnoses:  Osteomyelitis of left foot, unspecified type [facility])       Note:  This document was prepared using Dragon voice recognition software and may include unintentional dictation errors.    Phineas Semen, MD 11/24/22 [phone]

## 2022-11-24 NOTE — Assessment & Plan Note (Signed)
Home rosuvastatin 20 mg daily resumed

## 2022-11-24 NOTE — Progress Notes (Signed)
Patient arrived from ED, no sign of acute distress noted at this time. Telemetry box not available at this time to place for cardiac monitoring per order. Will place when telemetry box is available.

## 2022-11-25 ENCOUNTER — Inpatient Hospital Stay: Payer: Medicare PPO

## 2022-11-25 ENCOUNTER — Encounter
Admission: EM | Disposition: A | Discharge status: Home Health | Payer: Self-pay | Source: Home / Self Care | Attending: Internal Medicine

## 2022-11-25 DIAGNOSIS — M869 Osteomyelitis, unspecified: Secondary | ICD-10-CM | POA: Diagnosis not present

## 2022-11-25 DIAGNOSIS — I70222 Atherosclerosis of native arteries of extremities with rest pain, left leg: Secondary | ICD-10-CM | POA: Diagnosis not present

## 2022-11-25 DIAGNOSIS — L97529 Non-pressure chronic ulcer of other part of left foot with unspecified severity: Secondary | ICD-10-CM | POA: Diagnosis not present

## 2022-11-25 DIAGNOSIS — E785 Hyperlipidemia, unspecified: Secondary | ICD-10-CM

## 2022-11-25 DIAGNOSIS — I701 Atherosclerosis of renal artery: Secondary | ICD-10-CM

## 2022-11-25 DIAGNOSIS — I70245 Atherosclerosis of native arteries of left leg with ulceration of other part of foot: Secondary | ICD-10-CM

## 2022-11-25 DIAGNOSIS — I739 Peripheral vascular disease, unspecified: Secondary | ICD-10-CM

## 2022-11-25 DIAGNOSIS — E782 Mixed hyperlipidemia: Secondary | ICD-10-CM

## 2022-11-25 DIAGNOSIS — M86172 Other acute osteomyelitis, left ankle and foot: Secondary | ICD-10-CM | POA: Diagnosis not present

## 2022-11-25 DIAGNOSIS — Z8673 Personal history of transient ischemic attack (TIA), and cerebral infarction without residual deficits: Secondary | ICD-10-CM

## 2022-11-25 DIAGNOSIS — E1169 Type 2 diabetes mellitus with other specified complication: Secondary | ICD-10-CM

## 2022-11-25 DIAGNOSIS — I10 Essential (primary) hypertension: Secondary | ICD-10-CM

## 2022-11-25 DIAGNOSIS — L089 Local infection of the skin and subcutaneous tissue, unspecified: Secondary | ICD-10-CM | POA: Diagnosis not present

## 2022-11-25 HISTORY — PX: LOWER EXTREMITY ANGIOGRAPHY: CATH118251

## 2022-11-25 LAB — URINALYSIS, W/ REFLEX TO CULTURE (INFECTION SUSPECTED)
Bacteria, UA: NONE SEEN
Bilirubin Urine: NEGATIVE
Glucose, UA: 150 mg/dL — AB
Hgb urine dipstick: NEGATIVE
Ketones, ur: NEGATIVE mg/dL
Leukocytes,Ua: NEGATIVE
Nitrite: NEGATIVE
Protein, ur: 30 mg/dL — AB
Specific Gravity, Urine: 1.02 (ref 1.005–1.030)
Squamous Epithelial / HPF: 0 /[HPF] (ref 0–5)
pH: 6 (ref 5.0–8.0)

## 2022-11-25 LAB — SURGICAL PCR SCREEN
MRSA, PCR: POSITIVE — AB
Staphylococcus aureus: POSITIVE — AB

## 2022-11-25 LAB — BASIC METABOLIC PANEL
Anion gap: 5 (ref 5–15)
BUN: 14 mg/dL (ref 8–23)
CO2: 26 mmol/L (ref 22–32)
Calcium: 8.4 mg/dL — ABNORMAL LOW (ref 8.9–10.3)
Chloride: 105 mmol/L (ref 98–111)
Creatinine, Ser: 0.8 mg/dL (ref 0.61–1.24)
GFR, Estimated: >60 mL/min (ref >60)
Glucose, Bld: 188 mg/dL — ABNORMAL HIGH (ref 70–99)
Potassium: 3.8 mmol/L (ref 3.5–5.1)
Sodium: 136 mmol/L (ref 135–145)

## 2022-11-25 LAB — GLUCOSE, CAPILLARY
Glucose-Capillary: 181 mg/dL — ABNORMAL HIGH (ref 70–99)
Glucose-Capillary: 139 mg/dL — ABNORMAL HIGH (ref 70–99)
Glucose-Capillary: 89 mg/dL (ref 70–99)
Glucose-Capillary: 157 mg/dL — ABNORMAL HIGH (ref 70–99)

## 2022-11-25 LAB — CBC
HCT: 29.3 % — ABNORMAL LOW (ref 39.0–52.0)
Hemoglobin: 9.8 g/dL — ABNORMAL LOW (ref 13.0–17.0)
MCH: 28.8 pg (ref 26.0–34.0)
MCHC: 33.4 g/dL (ref 30.0–36.0)
MCV: 86.2 fL (ref 80.0–100.0)
Platelets: 189 10*3/uL (ref 150–400)
RBC: 3.4 MIL/uL — ABNORMAL LOW (ref 4.22–5.81)
RDW: 12.3 % (ref 11.5–15.5)
WBC: 5.5 10*3/uL (ref 4.0–10.5)
nRBC: 0 % (ref 0.0–0.2)

## 2022-11-25 LAB — HEMOGLOBIN A1C
Hgb A1c MFr Bld: 8.9 % — ABNORMAL HIGH (ref 4.8–5.6)
Mean Plasma Glucose: 208.73 mg/dL

## 2022-11-25 SURGERY — LOWER EXTREMITY ANGIOGRAPHY
Anesthesia: Moderate Sedation | Laterality: Left

## 2022-11-25 MED ORDER — HEPARIN SODIUM (PORCINE) 1000 UNIT/ML IJ SOLN
INTRAMUSCULAR | Status: DC | PRN
  Administered 2022-11-25: 5000 [IU] via INTRAVENOUS

## 2022-11-25 MED ORDER — CLOPIDOGREL BISULFATE 75 MG PO TABS: 75.0000 mg | ORAL_TABLET | Freq: Every day | ORAL | Status: DC

## 2022-11-25 MED ORDER — SODIUM CHLORIDE 0.9 % IV SOLN: INTRAVENOUS | Status: DC

## 2022-11-25 MED ORDER — LIDOCAINE-EPINEPHRINE (PF) 1 %-1:200000 IJ SOLN
INTRAMUSCULAR | Status: DC | PRN
  Administered 2022-11-25: 10 mL

## 2022-11-25 MED ORDER — HYDRALAZINE HCL 20 MG/ML IJ SOLN
INTRAMUSCULAR | Status: AC
  Filled 2022-11-25: qty 1

## 2022-11-25 MED ORDER — CEFAZOLIN SODIUM-DEXTROSE 2-4 GM/100ML-% IV SOLN
2.0000 g | INTRAVENOUS | Status: DC
  Filled 2022-11-25: qty 100

## 2022-11-25 MED ORDER — VANCOMYCIN HCL 1250 MG/250ML IV SOLN
1250.0000 mg | Freq: Two times a day (BID) | INTRAVENOUS | Status: DC
  Administered 2022-11-25 – 2022-11-28 (×6): 1250 mg via INTRAVENOUS
  Filled 2022-11-25 (×7): qty 250

## 2022-11-25 MED ORDER — ASPIRIN 81 MG PO TBEC
81.0000 mg | DELAYED_RELEASE_TABLET | Freq: Every day | ORAL | Status: DC
  Administered 2022-11-27 – 2022-11-28 (×2): 81 mg via ORAL
  Filled 2022-11-25 (×2): qty 1

## 2022-11-25 MED ORDER — FENTANYL CITRATE PF 50 MCG/ML IJ SOSY: 12.5000 ug | PREFILLED_SYRINGE | Freq: Once | INTRAMUSCULAR | Status: DC | PRN

## 2022-11-25 MED ORDER — INSULIN GLARGINE-YFGN 100 UNIT/ML ~~LOC~~ SOLN: 10.0000 [IU] | Freq: Two times a day (BID) | SUBCUTANEOUS | Status: DC

## 2022-11-25 MED ORDER — HEPARIN (PORCINE) IN NACL 1000-0.9 UT/500ML-% IV SOLN
INTRAVENOUS | Status: DC | PRN
  Administered 2022-11-25: 1000 mL

## 2022-11-25 MED ORDER — MIDAZOLAM HCL 2 MG/ML PO SYRP
8.0000 mg | ORAL_SOLUTION | Freq: Once | ORAL | Status: DC | PRN
  Filled 2022-11-25: qty 5

## 2022-11-25 MED ORDER — CLOPIDOGREL BISULFATE 75 MG PO TABS
75.0000 mg | ORAL_TABLET | Freq: Every day | ORAL | Status: DC
  Administered 2022-11-25: 75 mg via ORAL
  Filled 2022-11-25: qty 1

## 2022-11-25 MED ORDER — FAMOTIDINE 20 MG PO TABS: 40.0000 mg | ORAL_TABLET | Freq: Once | ORAL | Status: DC | PRN

## 2022-11-25 MED ORDER — CLOPIDOGREL BISULFATE 75 MG PO TABS
75.0000 mg | ORAL_TABLET | Freq: Every day | ORAL | Status: DC
  Administered 2022-11-27 – 2022-11-28 (×2): 75 mg via ORAL
  Filled 2022-11-25 (×2): qty 1

## 2022-11-25 MED ORDER — CEFAZOLIN SODIUM-DEXTROSE 2-4 GM/100ML-% IV SOLN
INTRAVENOUS | Status: AC
  Filled 2022-11-25: qty 100

## 2022-11-25 MED ORDER — FENTANYL CITRATE PF 50 MCG/ML IJ SOSY
PREFILLED_SYRINGE | INTRAMUSCULAR | Status: AC
  Filled 2022-11-25: qty 1

## 2022-11-25 MED ORDER — CEFAZOLIN SODIUM-DEXTROSE 1-4 GM/50ML-% IV SOLN
INTRAVENOUS | Status: AC | PRN
  Administered 2022-11-25: 2 g via INTRAVENOUS

## 2022-11-25 MED ORDER — DIPHENHYDRAMINE HCL 50 MG/ML IJ SOLN: 50.0000 mg | Freq: Once | INTRAMUSCULAR | Status: DC | PRN

## 2022-11-25 MED ORDER — HEPARIN SODIUM (PORCINE) 1000 UNIT/ML IJ SOLN
INTRAMUSCULAR | Status: AC
  Filled 2022-11-25: qty 10

## 2022-11-25 MED ORDER — CHLORHEXIDINE GLUCONATE CLOTH 2 % EX PADS
6.0000 | MEDICATED_PAD | Freq: Every day | CUTANEOUS | Status: DC
  Administered 2022-11-25 – 2022-11-28 (×4): 6 via TOPICAL

## 2022-11-25 MED ORDER — RAMIPRIL 5 MG PO CAPS
10.0000 mg | ORAL_CAPSULE | Freq: Every day | ORAL | Status: DC
  Administered 2022-11-27 – 2022-11-28 (×2): 10 mg via ORAL
  Filled 2022-11-25 (×4): qty 2

## 2022-11-25 MED ORDER — MIDAZOLAM HCL 2 MG/2ML IJ SOLN
INTRAMUSCULAR | Status: AC
  Filled 2022-11-25: qty 2

## 2022-11-25 MED ORDER — IODIXANOL 320 MG/ML IV SOLN
INTRAVENOUS | Status: DC | PRN
  Administered 2022-11-25: 60 mL

## 2022-11-25 MED ORDER — MUPIROCIN 2 % EX OINT
1.0000 | TOPICAL_OINTMENT | Freq: Two times a day (BID) | CUTANEOUS | Status: DC
  Administered 2022-11-25 – 2022-11-28 (×6): 1 via NASAL
  Filled 2022-11-25 (×2): qty 22

## 2022-11-25 MED ORDER — FENTANYL CITRATE (PF) 100 MCG/2ML IJ SOLN
INTRAMUSCULAR | Status: DC | PRN
  Administered 2022-11-25 (×2): 25 ug via INTRAVENOUS
  Administered 2022-11-25: 50 ug via INTRAVENOUS

## 2022-11-25 MED ORDER — INSULIN GLARGINE-YFGN 100 UNIT/ML ~~LOC~~ SOLN
10.0000 [IU] | Freq: Two times a day (BID) | SUBCUTANEOUS | Status: DC
Start: 2022-11-25 — End: 2022-11-27
  Administered 2022-11-25 – 2022-11-27 (×3): 10 [IU] via SUBCUTANEOUS
  Filled 2022-11-25 (×5): qty 0.1

## 2022-11-25 MED ORDER — METHYLPREDNISOLONE SODIUM SUCC 125 MG IJ SOLR: 125.0000 mg | Freq: Once | INTRAMUSCULAR | Status: DC | PRN

## 2022-11-25 MED ORDER — ASPIRIN 81 MG PO TBEC
81.0000 mg | DELAYED_RELEASE_TABLET | Freq: Every day | ORAL | Status: DC
  Administered 2022-11-25: 81 mg via ORAL
  Filled 2022-11-25: qty 1

## 2022-11-25 MED ORDER — MIDAZOLAM HCL 2 MG/2ML IJ SOLN
INTRAMUSCULAR | Status: DC | PRN
  Administered 2022-11-25 (×2): 1 mg via INTRAVENOUS

## 2022-11-25 SURGICAL SUPPLY — 20 items
BALLN LUTONIX 018 6X100X130 (BALLOONS) ×1
BALLN ULTRVRSE 2.5X300X150 (BALLOONS) ×1
BALLN ULTRVRSE 3X150X150 (BALLOONS) ×1
BALLOON LUTONIX 018 6X100X130 (BALLOONS) IMPLANT
BALLOON ULTRVRSE 2.5X300X150 (BALLOONS) IMPLANT
BALLOON ULTRVRSE 3X150X150 (BALLOONS) IMPLANT
CATH ANGIO 5F PIGTAIL 65CM (CATHETERS) IMPLANT
CATH BEACON 5 .038 100 VERT TP (CATHETERS) IMPLANT
CATH NAVICROSS ANGLED 135CM (MICROCATHETER) IMPLANT
COVER PROBE ULTRASOUND 5X96 (MISCELLANEOUS) IMPLANT
DEVICE PRESTO INFLATION (MISCELLANEOUS) IMPLANT
DEVICE STARCLOSE SE CLOSURE (Vascular Products) IMPLANT
GLIDEWIRE ADV .035X260CM (WIRE) IMPLANT
GUIDEWIRE PFTE-COATED .018X300 (WIRE) IMPLANT
PACK ANGIOGRAPHY (CUSTOM PROCEDURE TRAY) ×1 IMPLANT
SHEATH BRITE TIP 5FRX11 (SHEATH) IMPLANT
SHEATH RAABE 6FRX70 (SHEATH) IMPLANT
SYR MEDRAD MARK 7 150ML (SYRINGE) IMPLANT
TUBING CONTRAST HIGH PRESS 72 (TUBING) IMPLANT
WIRE GUIDERIGHT .035X150 (WIRE) IMPLANT

## 2022-11-25 NOTE — Interval H&P Note (Signed)
History and Physical Interval Note:  11/25/2022 1:19 PM  Jason [name]  has presented today for surgery, with the diagnosis of Left Foot Osteomyelitis.  The various methods of treatment have been discussed with the patient and family. After consideration of risks, benefits and other options for treatment, the patient has consented to  Procedure(s): Lower Extremity Angiography (Left) as a surgical intervention.  The patient's history has been reviewed, patient examined, no change in status, stable for surgery.  I have reviewed the patient's chart and labs.  Questions were answered to the patient's satisfaction.     Festus Barren

## 2022-11-25 NOTE — Progress Notes (Addendum)
  Progress Note   Patient: Jason [name] WUJ:[id number] DOB: Dec 22, 1945 DOA: 11/24/2022     1 DOS: the patient was seen and examined on 11/25/2022   Brief hospital course: Mr. Jason [name] is a 51 male with history of CAD, history of CVA of the basilar artery, hyperlipidemia, hypertension, who presents to the emergency department from podiatry clinic for chief concerns of left fifth toe osteomyelitis.  Vitals in the ED showed temperature 98, respiration rate of 14, heart rate of 65, blood pressure 146/113, SpO2 of 100% on room air.  Serum sodium is 137, potassium 3.6, chloride 105, bicarb 27, BUN of 18, serum creatinine of 0.83, EGFR greater than 60, nonfasting blood glucose 197, WBC 7.3, hemoglobin 10, platelets of 214.  ED treatment: Vancomycin per pharmacy.  Podiatry and vascular surgery was consulted.  ABI ordered.  11/14: Hemodynamically stable.  Going for lower extremity angiography and as needed intervention with vascular surgery today.  Assessment and Plan: * Foot osteomyelitis, left (HCC) Podiatry and vascular surgery was consulted, patient is going for lower extremity angiography by vascular surgery and likely will need amputation afterwards Continue vancomycin and cefepime Follow-up vascular surgery and podiatry recommendation Continue with supportive care  PVD (peripheral vascular disease) (HCC) Patient is going for lower extremity angiography by vascular surgery today -Holding aspirin and Plavix which can be resumed after procedure or as recommended by vascular surgery -Continue statin  Type 2 diabetes mellitus with hyperlipidemia (HCC) Insulin dependent diabetes mellitus with at bedtime coverage ordered Basal can be added once patient is able to eat-currently n.p.o.  Essential hypertension Ramipril 10 mg daily resumed on admission Hydralazine 5 mg every 6 hours as needed for SBP greater than 160, 4 days ordered  History of CVA (cerebrovascular accident) Home  aspirin and Plavix not resumed on admission, as patient will likely need a procedure.  Hyperlipidemia, mixed Home rosuvastatin 20 mg daily resumed   Subjective: Patient with no new concern, awaiting procedure with vascular surgery.  Physical Exam: Vitals:   11/25/22 0545 11/25/22 0640 11/25/22 0741 11/25/22 1237  BP: (!) 170/69 (!) 148/68 (!) 141/63 (!) 192/75  Pulse:    (!) 59  Resp: 14 15 18 14   Temp:   98.4 F (36.9 C) 98.1 F (36.7 C)  TempSrc:   Oral Oral  SpO2:   98% 99%  Weight:      Height:       General.  Overweight gentleman, in no acute distress. Pulmonary.  Lungs clear bilaterally, normal respiratory effort. CV.  Regular rate and rhythm, no JVD, rub or murmur. Abdomen.  Soft, nontender, nondistended, BS positive. CNS.  Alert and oriented .  No focal neurologic deficit. Extremities.  No edema, right TMA Psychiatry.  Judgment and insight appears normal.   Data Reviewed: Prior data reviewed  Family Communication:   Disposition: Status is: Inpatient Remains inpatient appropriate because: Severity of illness  Planned Discharge Destination: Home  Time spent: 45 minutes  This record has been created using Conservation officer, historic buildings. Errors have been sought and corrected,but may not always be located. Such creation errors do not reflect on the standard of care.   Author: Arnetha Courser, MD 11/25/2022 1:55 PM  For on call review [email or web address].

## 2022-11-25 NOTE — Progress Notes (Signed)
Pharmacy Antibiotic Note  Jason [name] is a 77 y.o. male admitted on 11/24/2022 with cellulitis. Patient presenting from outpatient podiatry with complaints of increased redness and swelling to left fifth toe concerning for diabetic foot ulcer/infection. PMH significant for T2DM and PVD s/p left great toe amputation and right transmetatarsal amputation. In ED, patient is afebrile with no leukocytosis. Pharmacy has been consulted for vancomycin and cefepime dosing.  Plan: Continue cefepime 2 g IV every 8 hours  Will adjust vancomycin dose to 1250 mg IV every 12 hours (eAUC 494, Cmin 14.5, Scr 0.8, IBW, Vd 0.72 L/kg)   Monitor renal function, clinical status, and LOT F/u vascular plans for intervention  Height: 5\' 10"  (177.8 cm) Weight: 98 kg (216 lb) IBW/kg (Calculated) : 73  Temp (24hrs), Avg:98 F (36.7 C), Min:97.8 F (36.6 C), Max:98.4 F (36.9 C)  Recent Labs  Lab 11/24/22 1444 11/24/22 1858 11/25/22 0347  WBC 7.3  --  5.5  CREATININE 0.83  --  0.80  LATICACIDVEN 1.0 0.9  --     Estimated Creatinine Clearance: 92.2 mL/min (by C-G formula based on SCr of 0.8 mg/dL).    No Known Allergies  Antimicrobials this admission: vancomycin 11/13 >>  cefepime 11/13 >>   Dose adjustments this admission: Vancomycin 750mg  Q12h>>1250mg  Q12h  Microbiology results:  11/14 MRSA PCR: (+)  Thank you for involving pharmacy in this patient's care.   Bettey Costa, PharmD Clinical Pharmacist 11/25/2022 9:59 AM

## 2022-11-25 NOTE — Consult Note (Signed)
ORTHOPAEDIC CONSULTATION  REQUESTING PHYSICIAN: Arnetha Courser, MD  Chief Complaint: Ulceration with infection left fifth MTPJ.  HPI: Jason [name] is a 77 y.o. male who complains of worsening infection to his left fifth MTPJ.  Admitted for further workup for vascular intervention as well as plan for lateral fifth ray amputation.  Patient well-known to the podiatry department.  Has undergone transmetatarsal amputation in the past on the right side with great toe amputation on the left side in the past.  Patient currently awaiting vascular invention today.  Past Medical History:  Diagnosis Date   Arthritis    CAD (coronary artery disease)    Colon polyp    Diabetes mellitus without complication (HCC)    Hyperlipemia    Hypertension    Orchitis    PVD (peripheral vascular disease) (HCC)    Stroke (cerebrum) (HCC)    Tremor    Wears dentures    Past Surgical History:  Procedure Laterality Date   ACHILLES TENDON SURGERY Right 04/02/2020   Procedure: ACHILLES LENGTHENING;  Surgeon: Rosetta Posner, DPM;  Location: [facility];  Service: Podiatry;  Laterality: Right;   AMPUTATION Right 08/21/2016   Procedure: RAY RESECTION-RIGHT 5TH RAY;  Surgeon: Linus Galas, DPM;  Location: [facility];  Service: Podiatry;  Laterality: Right;   AMPUTATION TOE Left    big   BONE EXOSTOSIS EXCISION Right 06/25/2021   Procedure: EXOSTOSIS RESECTION;  Surgeon: Rosetta Posner, DPM;  Location: [facility];  Service: Podiatry;  Laterality: Right;   CAROTID ENDARTERECTOMY Right    COLON SURGERY     CORONARY ARTERY BYPASS GRAFT     LOWER EXTREMITY ANGIOGRAPHY Right 03/31/2020   Procedure: Lower Extremity Angiography;  Surgeon: Annice Needy, MD;  Location: [facility];  Service: Cardiovascular;  Laterality: Right;   ROTATOR CUFF REPAIR Right    TRANSMETATARSAL AMPUTATION Right 04/02/2020   Procedure: TRANSMETATARSAL AMPUTATION;  Surgeon: Rosetta Posner, DPM;  Location: [facility];  Service: Podiatry;   Laterality: Right;   Social History   Socioeconomic History   Marital status: Widowed    Spouse name: Not on file   Number of children: Not on file   Years of education: Not on file   Highest education level: Not on file  Occupational History   Occupation: sales associate  Tobacco Use   Smoking status: Never   Smokeless tobacco: Never  Substance and Sexual Activity   Alcohol use: No   Drug use: No   Sexual activity: Not on file  Other Topics Concern   Not on file  Social History Narrative   Not on file   Social Determinants of Health   Financial Resource Strain: Not on file  Food Insecurity: No Food Insecurity (11/24/2022)   Hunger Vital Sign    Worried About Running Out of Food in the Last Year: Never true    Ran Out of Food in the Last Year: Never true  Transportation Needs: No Transportation Needs (11/24/2022)   PRAPARE - Administrator, Civil Service (Medical): No    Lack of Transportation (Non-Medical): No  Physical Activity: Not on file  Stress: Not on file  Social Connections: Not on file   Family History  Problem Relation Age of Onset   Diabetes Mother    Diabetes Father    No Known Allergies Prior to Admission medications   Medication Sig Start Date End Date Taking? Authorizing Provider  aspirin 81 MG tablet Take 81 mg by mouth daily.  Yes [provider]  glimepiride (AMARYL) 4 MG tablet Take 4 mg by mouth 2 (two) times daily.    Yes [provider]  insulin glargine (LANTUS) 100 UNIT/ML injection Inject 30 Units into the skin at bedtime.   Yes [provider]  insulin regular (NOVOLIN R,HUMULIN R) 100 units/mL injection Inject 5-10 Units into the skin 3 (three) times daily before meals.    Yes [provider]  ramipril (ALTACE) 10 MG capsule Take 10 mg by mouth daily.   Yes [provider]  Calcium Carbonate-Vit D-Min (CALCIUM 1200 PO) Take by mouth daily.    [provider]  clopidogrel  (PLAVIX) 75 MG tablet Take 75 mg by mouth daily.    [provider]  Continuous Blood Gluc Receiver (FREESTYLE LIBRE 14 DAY READER) DEVI Use 1 Device as directed 05/15/19   [provider]  Continuous Blood Gluc Receiver (FREESTYLE LIBRE 14 DAY READER) DEVI See admin instructions. 05/19/19   [provider]  Continuous Blood Gluc Receiver (FREESTYLE LIBRE 14 DAY READER) DEVI 1 Device. 05/15/19   [provider]  Continuous Blood Gluc Sensor (FREESTYLE LIBRE 14 DAY SENSOR) MISC SMARTSIG:1 Kit(s) Topical Every 2 Weeks 02/21/20   [provider]  docusate sodium (COLACE) 100 MG capsule Take 1 capsule (100 mg total) by mouth 2 (two) times daily as needed for mild constipation. 08/24/16   Altamese Dilling, MD  gabapentin (NEURONTIN) 300 MG capsule Take 1 capsule (300 mg total) by mouth 3 (three) times daily for 14 days. Patient not taking: Reported on 06/12/2021 04/05/20 04/19/20  Tresa Moore, MD  Magnesium 250 MG TABS Take by mouth.    [provider]  metFORMIN (GLUCOPHAGE) 1000 MG tablet Take 1,000 mg by mouth 2 (two) times daily with a meal.    [provider]  Multiple Vitamin (MULTIVITAMIN) capsule Take 1 capsule by mouth daily.    [provider]  Potassium 99 MG TABS Take by mouth daily.    [provider]  povidone-iodine (BETADINE) 10 % external solution To apply and clean the wound 3 times a week. 08/24/16   Altamese Dilling, MD  pravastatin (PRAVACHOL) 40 MG tablet Take 40 mg by mouth daily. Patient not taking: Reported on 06/12/2021    [provider]  propranolol (INDERAL) 40 MG tablet Take 1 tablet (40 mg total) by mouth 3 (three) times daily. 08/24/16   Altamese Dilling, MD  rosuvastatin (CRESTOR) 20 MG tablet Take by mouth. 12/10/19 06/25/21  [provider]   DG Chest Port 1 View  Result Date: 11/24/2022 CLINICAL DATA:  81191 Infection [id number] EXAM: PORTABLE CHEST 1 VIEW  COMPARISON:  None Available. FINDINGS: The heart and mediastinal contours are within normal limits. Atherosclerotic plaque. No focal consolidation. No pulmonary edema. No pleural effusion. No pneumothorax. No acute osseous abnormality. Sternotomy wires are intact. IMPRESSION: 1. No active disease. 2.  Aortic Atherosclerosis (ICD10-I70.0). Electronically Signed   By: Tish Frederickson M.D.   On: 11/24/2022 17:20    Positive ROS: All other systems have been reviewed and were otherwise negative with the exception of those mentioned in the HPI and as above.  12 point ROS was performed.  Physical Exam: General: Alert and oriented.  No apparent distress.  Vascular:  Left foot:Dorsalis Pedis:  absent Posterior Tibial:  absent  Right foot: Dorsalis Pedis:  thready Posterior Tibial:  thready  Neuro:absent protective sensation  Derm: Large necrotic ulcer to the lateral left fifth MTPJ with exposed bone.  Diffuse erythema of the left fifth toe.  Ortho/MS: Diffuse edema to the left fifth toe with edema laterally to the left foot.  Status post left great toe amputation.  Status post right foot transmetatarsal amputation  Assessment: Necrotic left fifth MTPJ ulceration Osteomyelitis left fifth MTPJ  Plan: Patient is awaiting vascular invention today.  Plan for left fifth ray amputation tomorrow with Dr. Excell Seltzer.  Had a long discussion with him in regards to the surgical plan.  He understands the risk benefits alternatives complications associated with surgery.  Orders will be placed for surgery tomorrow by Dr. Excell Seltzer.    Irean Hong, DPM Cell [phone]   11/25/2022 1:18 PM

## 2022-11-25 NOTE — Inpatient Diabetes Management (Signed)
Inpatient Diabetes Program Recommendations  AACE/ADA: New Consensus Statement on Inpatient Glycemic Control (2015)  Target Ranges:  Prepandial:   less than 140 mg/dL      Peak postprandial:   less than 180 mg/dL (1-2 hours)      Critically ill patients:  140 - 180 mg/dL    Latest Reference Range & Units 11/24/22 20:55 11/25/22 07:40  Glucose-Capillary 70 - 99 mg/dL 401 (H)  12 units Semglee 181 (H)  (H): Data is abnormally high     Admit with: Foot osteomyelitis   History: DM  Home DM Meds: Lantus 30 units at bedtime       Regular Insulin 5-10 units TID       Amaryl 4 mg BID       Metformin 1000 mg BID  Current Orders: Novolog Moderate Correction Scale/ SSI (0-15 units) TID AC + HS     MD- Note pt received 12 units Semglee insulin X 1 dose last PM  No Semglee ordered for today  Please consider Adding Semglee 15 units at bedtime and start tonight (50% home dose)    --Will follow patient during hospitalization--  Ambrose Finland RN, MSN, CDCES Diabetes Coordinator Inpatient Glycemic Control Team Team Pager: [phone] (8a-5p)

## 2022-11-25 NOTE — Op Note (Signed)
[facility]  Percutaneous Study/Intervention Procedural Note   Date of Surgery: 11/25/2022  Surgeon(s):Emmanuela Ghazi    Assistants:none  Pre-operative Diagnosis: PAD with ulceration and infection left foot  Post-operative diagnosis:  Same  Procedure(s) Performed:             1.  Ultrasound guidance for vascular access right femoral artery             2.  Catheter placement into left common femoral artery from right femoral approach             3.  Aortogram and selective left lower extremity angiogram             4.  Percutaneous transluminal angioplasty of left peroneal artery with 3 mm diameter by 15 cm length angioplasty balloon             5.  Percutaneous transluminal angioplasty of left popliteal artery with 6 mm diameter by 10 cm length Lutonix drug-coated angioplasty balloon  6.  Percutaneous transluminal angioplasty of left anterior tibial artery with 2.5 mm diameter by 30 cm length angioplasty balloon             7.  StarClose closure device right femoral artery  EBL: 10 cc  Contrast: 60 cc  Fluoro Time: 9.7 minutes  Moderate Conscious Sedation Time: approximately 37 minutes using 2 mg of Versed and 100 mcg of Fentanyl              Indications:  Patient is a 77 y.o.male with a history of PAD now with nonhealing ulceration and infection of the left foot. The patient is brought in for angiography for further evaluation and potential treatment.  Due to the limb threatening nature of the situation, angiogram was performed for attempted limb salvage. The patient is aware that if the procedure fails, amputation would be expected.  The patient also understands that even with successful revascularization, amputation may still be required due to the severity of the situation.  Risks and benefits are discussed and informed consent is obtained.   Procedure:  The patient was identified and appropriate procedural time out was performed.  The patient was then placed  supine on the table and prepped and draped in the usual sterile fashion. Moderate conscious sedation was administered during a face to face encounter with the patient throughout the procedure with my supervision of the RN administering medicines and monitoring the patient's vital signs, pulse oximetry, telemetry and mental status throughout from the start of the procedure until the patient was taken to the recovery room. Ultrasound was used to evaluate the right common femoral artery.  It was patent .  A digital ultrasound image was acquired.  A Seldinger needle was used to access the right common femoral artery under direct ultrasound guidance and a permanent image was performed.  A 0.035 J wire was advanced without resistance and a 5Fr sheath was placed.  Pigtail catheter was placed into the aorta and an AP aortogram was performed. This demonstrated mild stenosis of the right renal artery of about 20 to 30%.  The left renal artery had about a 60% stenosis.  There was a fairly normal aorta and iliac segments without significant stenosis. I then crossed the aortic bifurcation and advanced to the left femoral head. Selective left lower extremity angiogram was then performed. This demonstrated a fairly normal common femoral artery, profunda femoris artery, and superficial femoral artery.  The popliteal artery above the knee at Hunter's canal  was calcific and had moderate stenosis in the 60% range over about a 6 to 8 cm span.  The mid and distal popliteal artery were fairly normal.  There is then disease within the tibioperoneal trunk that appeared to be in the 50 to 60% range, a short segment high-grade focal stenosis in the peroneal artery of about 80 to 90%, and then this was the dominant runoff distally initially.  The posterior tibial artery was chronically occluded with faint distal reconstitution in the foot.  The anterior tibial artery had multiple short segment occlusions but was then continuous to the foot  although it did appear to occlude in the mid dorsalis pedis artery with diffuse collaterals in the foot. It was felt that it was in the patient's best interest to proceed with intervention after these images to avoid a second procedure and a larger amount of contrast and fluoroscopy based off of the findings from the initial angiogram. The patient was systemically heparinized and a 6 French 70 cm sheath was then placed over the Terumo Advantage wire. I then used a Kumpe catheter and the advantage wire to navigate down through the popliteal lesion and into the tibioperoneal trunk where I exchanged for a 0.018 advantage wire and crossed the stenosis in the tibioperoneal trunk and peroneal artery without difficulty.  These were then addressed with a single inflation with a 3 mm diameter by 15 cm length angioplasty balloon inflated to 8 atm for 1 minute.  The popliteal artery was then treated with a 6 mm diameter by 10 cm length Lutonix drug-coated angioplasty balloon inflated to 10 atm for 1 minute.  Completion imaging showed only about a 20 to 25% residual stenosis in the popliteal artery and only about a 20% residual stenosis in the tibioperoneal trunk and the proximal portion of the peroneal artery.  I then turned my attention to the anterior tibial artery.  After exchanging for a 135 cm Nava cross catheter and the 0.018 advantage wire, I was able to get into the anterior tibial artery and tediously cross the occlusions getting a wire all the way into the midfoot.  I then used 2 inflations with a 2.5 mm diameter by 30 cm length angioplasty balloon from the proximal dorsalis pedis artery all the way up to the origin of the anterior tibial artery.  The distal inflation was to 6 atm in the proximal inflation was 12 atm.  Completion imaging following this showed less than 30% residual stenosis in the anterior tibial artery and the patient now having two-vessel runoff distally. I elected to terminate the procedure. The  sheath was removed and StarClose closure device was deployed in the right femoral artery with excellent hemostatic result. The patient was taken to the recovery room in stable condition having tolerated the procedure well.  Findings:               Aortogram:  This demonstrated mild stenosis of the right renal artery of about 20 to 30%.  The left renal artery had about a 60% stenosis.  There was a fairly normal aorta and iliac segments without significant stenosis.             Left lower Extremity:  This demonstrated a fairly normal common femoral artery, profunda femoris artery, and superficial femoral artery.  The popliteal artery above the knee at Hunter's canal was calcific and had moderate stenosis in the 60% range over about a 6 to 8 cm span.  The mid and distal popliteal  artery were fairly normal.  There is then disease within the tibioperoneal trunk that appeared to be in the 50 to 60% range, a short segment high-grade focal stenosis in the peroneal artery of about 80 to 90%, and then this was the dominant runoff distally initially.  The posterior tibial artery was chronically occluded with faint distal reconstitution in the foot.  The anterior tibial artery had multiple short segment occlusions but was then continuous to the foot although it did appear to occlude in the mid dorsalis pedis artery with diffuse collaterals in the foot.   Disposition: Patient was taken to the recovery room in stable condition having tolerated the procedure well.  Complications: None  Festus Barren 11/25/2022 2:20 PM   This note was created with Dragon Medical transcription system. Any errors in dictation are purely unintentional.

## 2022-11-26 ENCOUNTER — Inpatient Hospital Stay: Payer: Medicare PPO

## 2022-11-26 ENCOUNTER — Inpatient Hospital Stay: Payer: Medicare PPO | Admitting: General Practice

## 2022-11-26 ENCOUNTER — Encounter: Payer: Self-pay | Admitting: Vascular Surgery

## 2022-11-26 ENCOUNTER — Encounter
Admission: EM | Disposition: A | Discharge status: Home Health | Payer: Self-pay | Source: Home / Self Care | Attending: Internal Medicine

## 2022-11-26 DIAGNOSIS — Z8673 Personal history of transient ischemic attack (TIA), and cerebral infarction without residual deficits: Secondary | ICD-10-CM | POA: Diagnosis not present

## 2022-11-26 DIAGNOSIS — E1169 Type 2 diabetes mellitus with other specified complication: Secondary | ICD-10-CM | POA: Diagnosis not present

## 2022-11-26 DIAGNOSIS — I10 Essential (primary) hypertension: Secondary | ICD-10-CM | POA: Diagnosis not present

## 2022-11-26 DIAGNOSIS — M86172 Other acute osteomyelitis, left ankle and foot: Secondary | ICD-10-CM | POA: Diagnosis not present

## 2022-11-26 HISTORY — PX: AMPUTATION: SHX166

## 2022-11-26 LAB — GLUCOSE, CAPILLARY
Glucose-Capillary: 185 mg/dL — ABNORMAL HIGH (ref 70–99)
Glucose-Capillary: 143 mg/dL — ABNORMAL HIGH (ref 70–99)
Glucose-Capillary: 117 mg/dL — ABNORMAL HIGH (ref 70–99)
Glucose-Capillary: 133 mg/dL — ABNORMAL HIGH (ref 70–99)
Glucose-Capillary: 240 mg/dL — ABNORMAL HIGH (ref 70–99)

## 2022-11-26 SURGERY — AMPUTATION, FOOT, RAY
Anesthesia: General | Site: Foot | Laterality: Left

## 2022-11-26 MED ORDER — ACETAMINOPHEN 10 MG/ML IV SOLN
INTRAVENOUS | Status: AC
  Filled 2022-11-26: qty 100

## 2022-11-26 MED ORDER — OXYCODONE HCL 5 MG/5ML PO SOLN: 5.0000 mg | Freq: Once | ORAL | Status: DC | PRN

## 2022-11-26 MED ORDER — OXYCODONE HCL 5 MG PO TABS: 5.0000 mg | ORAL_TABLET | Freq: Once | ORAL | Status: DC | PRN

## 2022-11-26 MED ORDER — EPHEDRINE SULFATE-NACL 50-0.9 MG/10ML-% IV SOSY
PREFILLED_SYRINGE | INTRAVENOUS | Status: DC | PRN
  Administered 2022-11-26: 5 mg via INTRAVENOUS

## 2022-11-26 MED ORDER — BUPIVACAINE HCL 0.5 % IJ SOLN
INTRAMUSCULAR | Status: DC | PRN
Start: 2022-11-26 — End: 2022-11-26
  Administered 2022-11-26: 20 mL

## 2022-11-26 MED ORDER — ACETAMINOPHEN 10 MG/ML IV SOLN
INTRAVENOUS | Status: DC | PRN
  Administered 2022-11-26: 1000 mg via INTRAVENOUS

## 2022-11-26 MED ORDER — MIDAZOLAM HCL 2 MG/2ML IJ SOLN
INTRAMUSCULAR | Status: AC
Start: 2022-11-26 — End: ?
  Filled 2022-11-26: qty 2

## 2022-11-26 MED ORDER — FENTANYL CITRATE (PF) 100 MCG/2ML IJ SOLN: 25.0000 ug | INTRAMUSCULAR | Status: DC | PRN

## 2022-11-26 MED ORDER — BUPIVACAINE HCL (PF) 0.5 % IJ SOLN
INTRAMUSCULAR | Status: AC
  Filled 2022-11-26: qty 30

## 2022-11-26 MED ORDER — ONDANSETRON HCL 4 MG/2ML IJ SOLN
INTRAMUSCULAR | Status: DC | PRN
  Administered 2022-11-26: 4 mg via INTRAVENOUS

## 2022-11-26 MED ORDER — LIDOCAINE HCL (PF) 1 % IJ SOLN
INTRAMUSCULAR | Status: AC
  Filled 2022-11-26: qty 30

## 2022-11-26 MED ORDER — PHENYLEPHRINE 80 MCG/ML (10ML) SYRINGE FOR IV PUSH (FOR BLOOD PRESSURE SUPPORT)
PREFILLED_SYRINGE | INTRAVENOUS | Status: DC | PRN
  Administered 2022-11-26 (×2): 80 ug via INTRAVENOUS

## 2022-11-26 MED ORDER — PROPOFOL 500 MG/50ML IV EMUL
INTRAVENOUS | Status: DC | PRN
  Administered 2022-11-26: 100 ug/kg/min via INTRAVENOUS

## 2022-11-26 MED ORDER — 0.9 % SODIUM CHLORIDE (POUR BTL) OPTIME
TOPICAL | Status: DC | PRN
  Administered 2022-11-26: 300 mL

## 2022-11-26 MED ORDER — POVIDONE-IODINE 7.5 % EX SOLN
Freq: Once | CUTANEOUS | Status: DC
  Filled 2022-11-26: qty 118

## 2022-11-26 MED ORDER — KETAMINE HCL 10 MG/ML IJ SOLN
INTRAMUSCULAR | Status: DC | PRN
  Administered 2022-11-26 (×2): 20 mg via INTRAVENOUS
  Administered 2022-11-26: 10 mg via INTRAVENOUS

## 2022-11-26 MED ORDER — LIDOCAINE HCL (PF) 1 % IJ SOLN: INTRAMUSCULAR | Status: DC | PRN

## 2022-11-26 MED ORDER — SODIUM CHLORIDE 0.9 % IV SOLN
INTRAVENOUS | Status: DC | PRN
Start: 2022-11-26 — End: 2022-11-26

## 2022-11-26 MED ORDER — KETAMINE HCL 50 MG/5ML IJ SOSY
PREFILLED_SYRINGE | INTRAMUSCULAR | Status: AC
  Filled 2022-11-26: qty 5

## 2022-11-26 MED ORDER — PROPOFOL 10 MG/ML IV BOLUS
INTRAVENOUS | Status: DC | PRN
  Administered 2022-11-26: 50 mg via INTRAVENOUS

## 2022-11-26 MED ORDER — MIDAZOLAM HCL 2 MG/2ML IJ SOLN
INTRAMUSCULAR | Status: DC | PRN
  Administered 2022-11-26: 2 mg via INTRAVENOUS

## 2022-11-26 MED ORDER — GLYCOPYRROLATE 0.2 MG/ML IJ SOLN
INTRAMUSCULAR | Status: DC | PRN
  Administered 2022-11-26: .2 mg via INTRAVENOUS

## 2022-11-26 MED ORDER — PROPOFOL 1000 MG/100ML IV EMUL
INTRAVENOUS | Status: AC
  Filled 2022-11-26: qty 200

## 2022-11-26 SURGICAL SUPPLY — 57 items
BAG COUNTER SPONGE SURGICOUNT (BAG) IMPLANT
BLADE OSC/SAGITTAL MD 9X18.5 (BLADE) ×1 IMPLANT
BLADE OSCILLATING/SAGITTAL (BLADE) ×1
BLADE SURG 15 STRL LF DISP TIS (BLADE) ×2 IMPLANT
BLADE SURG 15 STRL SS (BLADE) ×2
BLADE SW THK.38XMED LNG THN (BLADE) ×1 IMPLANT
BNDG COHESIVE 4X5 TAN STRL LF (GAUZE/BANDAGES/DRESSINGS) ×1 IMPLANT
BNDG ELASTIC 4INX 5YD STR LF (GAUZE/BANDAGES/DRESSINGS) ×1 IMPLANT
BNDG ELASTIC 6INX 5YD STR LF (GAUZE/BANDAGES/DRESSINGS) ×1 IMPLANT
BNDG ESMARCH 4X12 STRL LF (GAUZE/BANDAGES/DRESSINGS) ×1 IMPLANT
BNDG GAUZE DERMACEA FLUFF 4 (GAUZE/BANDAGES/DRESSINGS) ×2 IMPLANT
BNDG STRETCH 4X75 STRL LF (GAUZE/BANDAGES/DRESSINGS) ×1 IMPLANT
CUFF TOURN SGL QUICK 12 (TOURNIQUET CUFF) ×1 IMPLANT
CUFF TOURN SGL QUICK 18X4 (TOURNIQUET CUFF) ×1 IMPLANT
DRAIN PENROSE 12X.25 LTX STRL (MISCELLANEOUS) IMPLANT
DRAPE FLUOR MINI C-ARM 54X84 (DRAPES) ×1 IMPLANT
DRSG MEPILEX FLEX 3X3 (GAUZE/BANDAGES/DRESSINGS) IMPLANT
DURAPREP 26ML APPLICATOR (WOUND CARE) ×1 IMPLANT
ELECT REM PT RETURN 9FT ADLT (ELECTROSURGICAL) ×1
ELECTRODE REM PT RTRN 9FT ADLT (ELECTROSURGICAL) ×1 IMPLANT
GAUZE SPONGE 4X4 12PLY STRL (GAUZE/BANDAGES/DRESSINGS) ×1 IMPLANT
GAUZE XEROFORM 1X8 LF (GAUZE/BANDAGES/DRESSINGS) ×2 IMPLANT
GLOVE BIO SURGEON STRL SZ7 (GLOVE) ×1 IMPLANT
GLOVE INDICATOR 7.0 STRL GRN (GLOVE) ×1 IMPLANT
GLOVE INDICATOR 7.5 STRL GRN (GLOVE) ×1 IMPLANT
GOWN STRL REUS W/ TWL LRG LVL3 (GOWN DISPOSABLE) ×2 IMPLANT
GOWN STRL REUS W/TWL LRG LVL3 (GOWN DISPOSABLE) ×2
HANDLE YANKAUER SUCT BULB TIP (MISCELLANEOUS) IMPLANT
HANDPIECE VERSAJET DEBRIDEMENT (MISCELLANEOUS) IMPLANT
KIT TURNOVER KIT A (KITS) ×1 IMPLANT
MANIFOLD NEPTUNE II (INSTRUMENTS) ×1 IMPLANT
NDL HYPO 25X1 1.5 SAFETY (NEEDLE) ×2 IMPLANT
NDL SAFETY ECLIPSE 18X1.5 (NEEDLE) ×1 IMPLANT
NEEDLE HYPO 25X1 1.5 SAFETY (NEEDLE) ×2 IMPLANT
NS IRRIG 500ML POUR BTL (IV SOLUTION) ×1 IMPLANT
PACK EXTREMITY ARMC (MISCELLANEOUS) ×1 IMPLANT
PULSAVAC PLUS IRRIG FAN TIP (DISPOSABLE)
SOL PREP PVP 2OZ (MISCELLANEOUS) ×1
SOLUTION PREP PVP 2OZ (MISCELLANEOUS) ×1 IMPLANT
SPONGE T-LAP 18X18 ~~LOC~~+RFID (SPONGE) ×1 IMPLANT
STAPLER SKIN PROX 35W (STAPLE) ×1 IMPLANT
STOCKINETTE M/LG 89821 (MISCELLANEOUS) ×1 IMPLANT
STRIP CLOSURE SKIN 1/4X4 (GAUZE/BANDAGES/DRESSINGS) ×1 IMPLANT
SUT ETHILON 2 0 FS 18 (SUTURE) ×2 IMPLANT
SUT ETHILON 2 0 FSLX (SUTURE) ×1 IMPLANT
SUT ETHILON 3-0 FS-10 30 BLK (SUTURE) ×2
SUT VIC AB 2-0 CT1 27 (SUTURE)
SUT VIC AB 2-0 CT1 TAPERPNT 27 (SUTURE) ×1 IMPLANT
SUT VIC AB 2-0 SH 27 (SUTURE)
SUT VIC AB 2-0 SH 27XBRD (SUTURE) ×1 IMPLANT
SUT VIC AB 3-0 SH 27 (SUTURE) ×1
SUT VIC AB 3-0 SH 27X BRD (SUTURE) ×2 IMPLANT
SUTURE EHLN 3-0 FS-10 30 BLK (SUTURE) ×2 IMPLANT
SYR 10ML LL (SYRINGE) ×2 IMPLANT
TIP FAN IRRIG PULSAVAC PLUS (DISPOSABLE) IMPLANT
TRAP FLUID SMOKE EVACUATOR (MISCELLANEOUS) ×1 IMPLANT
WATER STERILE IRR 500ML POUR (IV SOLUTION) ×1 IMPLANT

## 2022-11-26 NOTE — H&P (Signed)
HISTORY AND PHYSICAL INTERVAL NOTE:  11/26/2022  11:53 AM  Jason [name]  has presented today for surgery, with the diagnosis of Osteomyelitis L 5TH MTPJ WITH ASSOCIATED DIABETIC FOOT ULCERATION; PVD.  The various methods of treatment have been discussed with the patient.  No guarantees were given.  After consideration of risks, benefits and other options for treatment, the patient has consented to surgery.  I have reviewed the patients' chart and labs.    PROCEDURE: LEFT PARTIAL FIFTH RAY AMPUTATION  A history and physical examination was performed in the hospital.  The patient was reexamined.  There have been no changes to this history and physical examination.  Rosetta Posner, DPM

## 2022-11-26 NOTE — Anesthesia Postprocedure Evaluation (Signed)
Anesthesia Post Note  Patient: Jason ROHAL  Procedure(s) Performed: PARTIAL 5TH RAY AMPUTATION (Left: Foot)  Patient location during evaluation: PACU Anesthesia Type: General Level of consciousness: awake and alert Pain management: pain level controlled Vital Signs Assessment: post-procedure vital signs reviewed and stable Respiratory status: spontaneous breathing, nonlabored ventilation, respiratory function stable and patient connected to nasal cannula oxygen Cardiovascular status: blood pressure returned to baseline and stable Postop Assessment: no apparent nausea or vomiting Anesthetic complications: no  No notable events documented.   Last Vitals:  Vitals:   11/26/22 1345 11/26/22 1408  BP: 138/70 (!) 151/65  Pulse: 63 62  Resp: 12 16  Temp: 37 C 36.6 C  SpO2: 98% 95%    Last Pain:  Vitals:   11/26/22 1330  TempSrc:   PainSc: 0-No pain                 Stephanie Coup

## 2022-11-26 NOTE — Progress Notes (Signed)
  Progress Note   Patient: Jason [name] ZHY:[id number] DOB: 11/26/1945 DOA: 11/24/2022     2 DOS: the patient was seen and examined on 11/26/2022   Brief hospital course: Mr. Jason [name] is a 70 male with history of CAD, history of CVA of the basilar artery, hyperlipidemia, hypertension, who presents to the emergency department from podiatry clinic for chief concerns of left fifth toe osteomyelitis.  Vitals in the ED showed temperature 98, respiration rate of 14, heart rate of 65, blood pressure 146/113, SpO2 of 100% on room air.  Serum sodium is 137, potassium 3.6, chloride 105, bicarb 27, BUN of 18, serum creatinine of 0.83, EGFR greater than 60, nonfasting blood glucose 197, WBC 7.3, hemoglobin 10, platelets of 214.  ED treatment: Vancomycin per pharmacy.  Podiatry and vascular surgery was consulted.  ABI ordered.  11/14: Hemodynamically stable.  Going for lower extremity angiography and as needed intervention with vascular surgery today.  11/15: Mildly elevated blood pressure, s/p lower extremity angiography and 2 stents placed in left peroneal and left anterior tibial by vascular surgery yesterday, underwent left partial fifth ray amputation with podiatry today.  Assessment and Plan: * Foot osteomyelitis, left (HCC) Podiatry and vascular surgery was consulted, s/p angiography and PCI followed by partial left fifth ray amputation today -PT/OT evaluation starting from tomorrow Continue with supportive care  PVD (peripheral vascular disease) (HCC) S/p lower extremity angiography and 2 stents placed in left peroneal and left anterior tibial by vascular surgery yesterday -Aspirin and Plavix were restarted -Continue statin  Type 2 diabetes mellitus with hyperlipidemia (HCC) Insulin dependent diabetes mellitus with at bedtime coverage ordered Basal can be added once patient is able to eat-currently n.p.o.  Essential hypertension Blood pressure mildly elevated. Ramipril 10 mg  daily resumed on admission Hydralazine 5 mg every 6 hours as needed for SBP greater than 160, 4 days ordered  History of CVA (cerebrovascular accident) Home aspirin and Plavix not resumed on admission, as patient will likely need a procedure.  Hyperlipidemia, mixed Home rosuvastatin 20 mg daily resumed   Subjective: Patient was seen before the amputation today.  No new concern.  Denies any pain.  Physical Exam: Vitals:   11/26/22 1330 11/26/22 1345 11/26/22 1408 11/26/22 1602  BP: 125/69 138/70 (!) 151/65 (!) 163/71  Pulse: 66 63 62 63  Resp: 16 12 16 17   Temp:  98.6 F (37 C) 97.8 F (36.6 C) 97.7 F (36.5 C)  TempSrc:      SpO2: 100% 98% 95% 95%  Weight:      Height:       General.  Overweight elderly man, in no acute distress. Pulmonary.  Lungs clear bilaterally, normal respiratory effort. CV.  Regular rate and rhythm, no JVD, rub or murmur. Abdomen.  Soft, nontender, nondistended, BS positive. CNS.  Alert and oriented .  No focal neurologic deficit. Extremities.  No edema, no cyanosis, pulses intact and symmetrical. R TMA Psychiatry.  Judgment and insight appears normal.   Data Reviewed: Prior data reviewed  Family Communication: Called daughter with no response  Disposition: Status is: Inpatient Remains inpatient appropriate because: Severity of illness  Planned Discharge Destination: Home  Time spent: 45 minutes  This record has been created using Conservation officer, historic buildings. Errors have been sought and corrected,but may not always be located. Such creation errors do not reflect on the standard of care.   Author: Arnetha Courser, MD 11/26/2022 4:24 PM  For on call review [email or web address].

## 2022-11-26 NOTE — Progress Notes (Signed)
Gave pre op report patient NPO since midnight, ok to give short acting insulin held ,long acting per MD, hold other PO meds, CHG done this morning. Consent in route patient oriented to consent.

## 2022-11-26 NOTE — Op Note (Signed)
PODIATRY / FOOT AND ANKLE SURGERY OPERATIVE REPORT    SURGEON: Rosetta Posner, DPM  PRE-OPERATIVE DIAGNOSIS:  1.  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  Diabetes type 2 with polyneuropathy 3.  PVD  POST-OPERATIVE DIAGNOSIS: Same  PROCEDURE(S): Left partial fifth ray amputation with rotational skin flap closure  HEMOSTASIS: Left ankle tourniquet  ANESTHESIA: MAC  ESTIMATED BLOOD LOSS: 40 cc  FINDING(S): 1.  Soft left fifth metatarsal phalangeal joint consistent with osteomyelitis with some purulent discharge present at the flexor tendon sheath  PATHOLOGY/SPECIMEN(S): Left fifth metatarsal phalange joint wound culture and bone culture; left fifth metatarsal/ray amputation with proximal margin marked in purple ink path specimen  INDICATIONS:   Jason [name] is a 77 y.o. male who presents with a chronic nonhealing ulceration to the lateral aspect of the left foot near the fifth metatarsal phalange joint.  Patient has been treated conservatively with local wound care and unfortunately the wound worsened to the point where he has bone infection with increased redness and swelling to the area as well as purulent discharge.  Patient has been placed on antibiotics but has no improvement.  Patient was also noted to have poor circulation so he was sent to the emergency room for evaluation of circulation and for ray amputation.  Patient had revascularization procedure performed and presents today for surgical intervention for his left foot chronic ulceration with infection.  No guarantees given to patient.  All treatment options were discussed with the patient both conservative and surgical attempts at correction clean potential risks and complications at this time patient is elected for procedure consisting of left partial fifth ray amputation..  DESCRIPTION: After obtaining full informed written consent, the patient was brought back to the  operating room and placed supine upon the operating table.  The patient received IV antibiotics prior to induction.  After obtaining adequate anesthesia, the patient was prepped and draped in the standard fashion.  A preoperative block was performed with 20 cc of half percent Marcaine plain and a ray type block to the fifth ray.  The wound to the fifth metatarsal phalange joint was measured and measured to be approximately 2 cm x 2 cm x 0.5 cm down to hard bone.  Attention was then directed to the left fifth ray where an incision was marked out excising the ulceration and racquet and around the toe and extending up the proximal fifth metatarsal to the midshaft.  Once the incision was marked out the incision was made straight to bone excising the ulceration and racquet and around the fifth toe.  There appeared to be an excessive amount of bleeding during this time so the Esmarch bandage used to exsanguinate the left lower extremity and the pneumatic ankle tourniquet was inflated briefly.  The incision was deepened all the way down to bone once again and an extensor tenotomy and capsulotomy was performed followed by release the collateral and suspensory ligaments as well as any connection to the plantar plate and flexor tendon.  The toe was disarticulated and passed off the operative site.  The toe appeared to be soft and appeared to have some microfracturing present at the joint consistent with likely bone infection, the fifth metatarsal head appeared to be exposed and very soft in nature also consistent with osteomyelitis.  A wound culture was taken from this area as well as bone culture and passed off the operative site.  Circumferential dissection was then continued around the fifth metatarsal head  to the midshaft.  At this time the sagittal bone saw was then used to create an osteotomy through the fifth metatarsal with the appropriate beveling leaving more dorsal medial and plantar lateral.  The fifth  metatarsal was dissected free and resected from the operative site in the area of the osteotomy site and distal.  The fifth metatarsal proximal margin was marked in purple ink and sent off with the toe as a pathology specimen.  The plantar plate and flexor tendon as well as extensor tendons were cut as far proximally as possible.  Any bleeding vessels were then cauterized further after the pneumatic ankle tourniquet was deflated, prompt hyperemic response was noted all digits left foot.  Deep closure was then obtained with 3-0 Vicryl as well as subcutaneous and skin closure with 3-0 Vicryl.  The skin was then rotated into the appropriate position covering the void of the previous wound advancing and rotating the flap 2 cm x 2 cm.  The skin was then reapproximated well coapted with 3-0 nylon simple and horizontal mattress type stitching.  A postoperative dressing was then applied consisting of Xeroform followed by 4 x 4 gauze, ABD, Kerlix, Ace wrap.  The patient tolerated the procedure and anesthesia well and was transferred to recovery room with vital signs stable vascular status appearing to be intact all toes left foot.  Following a period of postoperative monitoring the patient be discharged back to the inpatient room with the appropriate orders, instructions, medications.  He is to remain partial weightbearing with heel contact and postop shoe for short distances.  Will see patient tomorrow again for dressing change and likely will plan for discharge at that point.  COMPLICATIONS: none  CONDITION: Good, stable  Rosetta Posner, DPM

## 2022-11-26 NOTE — Progress Notes (Signed)
Patient off of the floor for a procedure.

## 2022-11-26 NOTE — Transfer of Care (Signed)
Immediate Anesthesia Transfer of Care Note  Patient: Jason [name]  Procedure(s) Performed: PARTIAL 5TH RAY AMPUTATION (Left: Foot)  Patient Location: PACU  Anesthesia Type:General  Level of Consciousness: drowsy and patient cooperative  Airway & Oxygen Therapy: Patient Spontanous Breathing and Patient connected to face mask oxygen  Post-op Assessment: Report given to RN and Post -op Vital signs reviewed and stable  Post vital signs: Reviewed and stable  Last Vitals:  Vitals Value Taken Time  BP 107/54 11/26/22 1307  Temp 37 C 11/26/22 1307  Pulse 68 11/26/22 1313  Resp 15 11/26/22 1313  SpO2 100 % 11/26/22 1313  Vitals shown include unfiled device data.  Last Pain:  Vitals:   11/26/22 1307  TempSrc:   PainSc: Asleep         Complications: No notable events documented.

## 2022-11-26 NOTE — Plan of Care (Signed)
  Problem: Health Behavior/Discharge Planning: Goal: Ability to manage health-related needs will improve Outcome: Progressing   

## 2022-11-26 NOTE — Anesthesia Preprocedure Evaluation (Signed)
Anesthesia Evaluation  Patient identified by MRN, date of birth, ID band Patient awake    Reviewed: Allergy & Precautions, H&P , NPO status , Patient's Chart, lab work & pertinent test results  Airway Mallampati: III  TM Distance: >3 FB Neck ROM: full    Dental  (+) Chipped, Dental Advidsory Given   Pulmonary neg pulmonary ROS   Pulmonary exam normal breath sounds clear to auscultation       Cardiovascular hypertension, + CAD and + Peripheral Vascular Disease  Normal cardiovascular exam Rhythm:regular Rate:Normal     Neuro/Psych CVA, No Residual Symptoms  negative psych ROS   GI/Hepatic negative GI ROS, Neg liver ROS,,,  Endo/Other  diabetes, Insulin Dependent    Renal/GU negative Renal ROS  negative genitourinary   Musculoskeletal   Abdominal   Peds  Hematology negative hematology ROS (+)   Anesthesia Other Findings Past Medical History: No date: Arthritis No date: CAD (coronary artery disease) No date: Colon polyp No date: Diabetes mellitus without complication (HCC) No date: Hyperlipemia No date: Hypertension No date: Orchitis No date: PVD (peripheral vascular disease) (HCC) No date: Stroke (cerebrum) (HCC) No date: Tremor No date: Wears dentures  Past Surgical History: 04/02/2020: ACHILLES TENDON SURGERY; Right     Comment:  Procedure: ACHILLES LENGTHENING;  Surgeon: Rosetta Posner, DPM;  Location: [facility];  Service: Podiatry;                Laterality: Right; 08/21/2016: AMPUTATION; Right     Comment:  Procedure: RAY RESECTION-RIGHT 5TH RAY;  Surgeon: Linus Galas, DPM;  Location: [facility];  Service: Podiatry;                Laterality: Right; No date: AMPUTATION TOE; Left     Comment:  big 06/25/2021: BONE EXOSTOSIS EXCISION; Right     Comment:  Procedure: EXOSTOSIS RESECTION;  Surgeon: Rosetta Posner,              DPM;  Location: [facility];  Service: Podiatry;                Laterality: Right; No date: CAROTID ENDARTERECTOMY; Right No date: COLON SURGERY No date: CORONARY ARTERY BYPASS GRAFT 03/31/2020: LOWER EXTREMITY ANGIOGRAPHY; Right     Comment:  Procedure: Lower Extremity Angiography;  Surgeon: Annice Needy, MD;  Location: [facility];  Service:               Cardiovascular;  Laterality: Right; 11/25/2022: LOWER EXTREMITY ANGIOGRAPHY; Left     Comment:  Procedure: Lower Extremity Angiography;  Surgeon: Annice Needy, MD;  Location: [facility];  Service:               Cardiovascular;  Laterality: Left; No date: ROTATOR CUFF REPAIR; Right 04/02/2020: TRANSMETATARSAL AMPUTATION; Right     Comment:  Procedure: TRANSMETATARSAL AMPUTATION;  Surgeon: Rosetta Posner, DPM;  Location: [facility];  Service: Podiatry;                Laterality: Right;  BMI    Body Mass Index: 31.00 kg/m  Reproductive/Obstetrics negative OB ROS                             Anesthesia Physical Anesthesia Plan  ASA: 3  Anesthesia Plan: General   Post-op Pain Management:    Induction: Intravenous  PONV Risk Score and Plan: 2 and Propofol infusion, TIVA and Midazolam  Airway Management Planned: Natural Airway and Nasal Cannula  Additional Equipment:   Intra-op Plan:   Post-operative Plan:   Informed Consent: I have reviewed the patients History and Physical, chart, labs and discussed the procedure including the risks, benefits and alternatives for the proposed anesthesia with the patient or authorized representative who has indicated his/her understanding and acceptance.     Dental Advisory Given  Plan Discussed with: Anesthesiologist, CRNA and Surgeon  Anesthesia Plan Comments: (Patient consented for risks of anesthesia including but not limited to:  - adverse reactions to medications - risk of airway placement if required - damage to eyes, teeth, lips or other  oral mucosa - nerve damage due to positioning  - sore throat or hoarseness - Damage to heart, brain, nerves, lungs, other parts of body or loss of life  Patient voiced understanding and assent.)       Anesthesia Quick Evaluation

## 2022-11-26 NOTE — Progress Notes (Signed)
Pharmacy Antibiotic Note  Jason [name] is a 77 y.o. male admitted on 11/24/2022 with cellulitis. Patient presenting from outpatient podiatry with complaints of increased redness and swelling to left fifth toe concerning for diabetic foot ulcer/infection. PMH significant for T2DM and PVD s/p left great toe amputation and right transmetatarsal amputation. In ED, patient is afebrile with no leukocytosis. Pharmacy has been consulted for vancomycin and cefepime dosing.  Antibiotics Day 3. Plan for partial left fifth toe amputation today with podiatry. Will continue broad spectrum antibiotic coverage for at least 24 hours post-op, with plans to de-escalate to oral alternatives if cultures remain negative.  Plan: Cefepime 2 g IV every 8 hours  Continue Vancomycin 1250 mg IV every 12 hours  (eAUC 494, Cmin 14.5, Scr 0.8, IBW, Vd 0.72 L/kg)   Monitor renal function, clinical status, and LOT F/u vascular plans for intervention  Height: 5\' 10"  (177.8 cm) Weight: 98 kg (216 lb 0.8 oz) IBW/kg (Calculated) : 73  Temp (24hrs), Avg:98.1 F (36.7 C), Min:97.7 F (36.5 C), Max:98.6 F (37 C)  Recent Labs  Lab 11/24/22 1444 11/24/22 1858 11/25/22 0347  WBC 7.3  --  5.5  CREATININE 0.83  --  0.80  LATICACIDVEN 1.0 0.9  --     Estimated Creatinine Clearance: 92.2 mL/min (by C-G formula based on SCr of 0.8 mg/dL).    No Known Allergies  Antimicrobials this admission: vancomycin 11/13 >>  cefepime 11/13 >>   Dose adjustments this admission: Vancomycin 750mg  Q12h>>1250mg  Q12h  Microbiology results:  11/14 MRSA PCR: (+)  Thank you for involving pharmacy in this patient's care.   Bettey Costa, PharmD Clinical Pharmacist 11/26/2022 1:14 PM

## 2022-11-26 NOTE — Plan of Care (Signed)

## 2022-11-26 NOTE — Anesthesia Procedure Notes (Addendum)
Procedure Name: General with mask airway Date/Time: 11/26/2022 12:02 PM  Performed by: Mohammed Kindle, CRNAPre-anesthesia Checklist: Patient identified, Emergency Drugs available, Suction available and Patient being monitored Patient Re-evaluated:Patient Re-evaluated prior to induction Oxygen Delivery Method: Simple face mask Induction Type: IV induction Ventilation: Oral airway inserted - appropriate to patient size Placement Confirmation: positive ETCO2, breath sounds checked- equal and bilateral and CO2 detector Dental Injury: Teeth and Oropharynx as per pre-operative assessment

## 2022-11-26 NOTE — Assessment & Plan Note (Signed)
S/p lower extremity angiography and 2 stents placed in left peroneal and left anterior tibial by vascular surgery yesterday -Aspirin and Plavix were restarted -Continue statin

## 2022-11-26 NOTE — Assessment & Plan Note (Signed)
Podiatry and vascular surgery was consulted, s/p angiography and PCI followed by partial left fifth ray amputation today -PT/OT evaluation starting from tomorrow Continue with supportive care

## 2022-11-26 NOTE — Progress Notes (Signed)
Patient back from PACU received bedside report from RN, patient had a  left foot left toe 5th partial amputation, patient is stable slightly drowsy easily but alert on command, patients dressing is clean dry and intact.

## 2022-11-26 NOTE — Assessment & Plan Note (Signed)
Blood pressure mildly elevated. Ramipril 10 mg daily resumed on admission Hydralazine 5 mg every 6 hours as needed for SBP greater than 160, 4 days ordered

## 2022-11-26 NOTE — TOC CM/SW Note (Signed)
Transition of Care [facility]) - Inpatient Brief Assessment   Patient Details  Name: Jason [name] MRN: [id number] Date of Birth: [birth year]  Transition of Care [facility]) CM/SW Contact:    Margarito Liner, LCSW Phone Number: 11/26/2022, 9:38 AM   Clinical Narrative: CSW reviewed chart. No TOC needs identified so far. CSW will continue to follow progress. Please place [facility] consult if any needs arise.  Transition of Care Asessment: Insurance and Status: Insurance coverage has been reviewed Patient has primary care physician: Yes Home environment has been reviewed: Single family home Prior level of function:: Not documented Prior/Current Home Services: No current home services Social Determinants of Health Reivew: SDOH reviewed no interventions necessary Readmission risk has been reviewed: Yes Transition of care needs: no transition of care needs at this time

## 2022-11-26 NOTE — Progress Notes (Signed)
  Progress Note    11/26/2022 7:49 AM 1 Day Post-Op  Subjective:  Jason [name] is a 77 yo male now POD #1 from aortogram with selective left lower extremity angiogram.  He also underwent transluminal angioplasty of the left peroneal artery and the left popliteal artery as well as the left anterior tibial artery.  No stent placement at this time.  Procedure was considered successful for revascularization of the patient's left lower extremity.  On exam this morning patient is resting comfortably in bed.  No complaints overnight.  Patient endorses left lower extremity feels warmer this morning.  Patient has not ambulated as of yet.  Patient endorses that podiatry is taking him to the operating room today for another procedure.  No other complaints overnight.  Vitals all remained stable   Vitals:   11/25/22 2018 11/26/22 0359  BP: (!) 148/63 (!) 151/62  Pulse: 68 61  Resp: 16 16  Temp: 98.5 F (36.9 C) 98.1 F (36.7 C)  SpO2: 98% 99%   Physical Exam: Cardiac:  RRR, normal S1 and S2, no murmurs. Lungs: Lungs clear on auscultation but diminished in the bases.  No rales rhonchi or wheezing noted.  Normal respiratory effort on room air. Incisions: Right groin incision with dressing clean dry and intact.  No hematoma seroma to note. Extremities: Bilateral lower extremities with weak palpable pulses to both DP/PT.  Both lower extremities warm to touch.  Patient continues to endorse pain at the right left fifth toe. Abdomen: Positive bowel sounds throughout, soft, nontender nondistended. Neurologic: Alert and oriented to person place and time.  Answers all questions and follows commands appropriately.  CBC    Component Value Date/Time   WBC 5.5 11/25/2022 0347   RBC 3.40 (L) 11/25/2022 0347   HGB 9.8 (L) 11/25/2022 0347   HCT 29.3 (L) 11/25/2022 0347   PLT 189 11/25/2022 0347   MCV 86.2 11/25/2022 0347   MCH 28.8 11/25/2022 0347   MCHC 33.4 11/25/2022 0347   RDW 12.3 11/25/2022 0347    LYMPHSABS 1.2 11/24/2022 1444   MONOABS 0.6 11/24/2022 1444   EOSABS 0.2 11/24/2022 1444   BASOSABS 0.0 11/24/2022 1444    BMET    Component Value Date/Time   NA 136 11/25/2022 0347   K 3.8 11/25/2022 0347   CL 105 11/25/2022 0347   CO2 26 11/25/2022 0347   GLUCOSE 188 (H) 11/25/2022 0347   BUN 14 11/25/2022 0347   CREATININE 0.80 11/25/2022 0347   CALCIUM 8.4 (L) 11/25/2022 0347   GFRNONAA >60 11/25/2022 0347   GFRAA >60 08/23/2016 0409    INR No results found for: "INR"  No intake or output data in the 24 hours ending 11/26/22 0749   Assessment/Plan:  77 y.o. male is s/p aortogram with selective left lower extremity angiogram.  He also underwent transluminal angioplasty of the left peroneal artery and the left popliteal artery as well as the left anterior tibial artery.  No stent placement at this time. 1 Day Post-Op   PLAN: Patient to go to the operating room for surgical procedure sometime today with podiatry.  Patient to remain NPO until after procedure.  Vascular Surgery to sign off at this time.   DVT prophylaxis:  ASA 81 mg Patient to be restarted on his plavix 11/27/22 per podiatry.    Marcie Bal [facility] 11/26/2022 7:49 AM

## 2022-11-27 DIAGNOSIS — M86172 Other acute osteomyelitis, left ankle and foot: Secondary | ICD-10-CM | POA: Diagnosis not present

## 2022-11-27 DIAGNOSIS — I10 Essential (primary) hypertension: Secondary | ICD-10-CM | POA: Diagnosis not present

## 2022-11-27 DIAGNOSIS — E1169 Type 2 diabetes mellitus with other specified complication: Secondary | ICD-10-CM | POA: Diagnosis not present

## 2022-11-27 DIAGNOSIS — Z8673 Personal history of transient ischemic attack (TIA), and cerebral infarction without residual deficits: Secondary | ICD-10-CM | POA: Diagnosis not present

## 2022-11-27 LAB — GLUCOSE, CAPILLARY
Glucose-Capillary: 294 mg/dL — ABNORMAL HIGH (ref 70–99)
Glucose-Capillary: 331 mg/dL — ABNORMAL HIGH (ref 70–99)
Glucose-Capillary: 264 mg/dL — ABNORMAL HIGH (ref 70–99)
Glucose-Capillary: 168 mg/dL — ABNORMAL HIGH (ref 70–99)

## 2022-11-27 MED ORDER — DOXYCYCLINE HYCLATE 100 MG PO TABS: 100.0000 mg | ORAL_TABLET | Freq: Two times a day (BID) | ORAL | 0 refills | Status: AC

## 2022-11-27 MED ORDER — AMLODIPINE BESYLATE 10 MG PO TABS: 10.0000 mg | ORAL_TABLET | Freq: Every day | ORAL | 1 refills | Status: DC

## 2022-11-27 MED ORDER — MUPIROCIN 2 % EX OINT: 1.0000 | TOPICAL_OINTMENT | Freq: Two times a day (BID) | CUTANEOUS | Status: DC

## 2022-11-27 MED ORDER — AMLODIPINE BESYLATE 10 MG PO TABS
10.0000 mg | ORAL_TABLET | Freq: Every day | ORAL | Status: DC
  Administered 2022-11-27 – 2022-11-28 (×2): 10 mg via ORAL
  Filled 2022-11-27 (×2): qty 1

## 2022-11-27 MED ORDER — INSULIN GLARGINE-YFGN 100 UNIT/ML ~~LOC~~ SOLN
15.0000 [IU] | Freq: Two times a day (BID) | SUBCUTANEOUS | Status: DC
  Administered 2022-11-27 – 2022-11-28 (×2): 15 [IU] via SUBCUTANEOUS
  Filled 2022-11-27 (×3): qty 0.15

## 2022-11-27 MED ORDER — INSULIN ASPART 100 UNIT/ML IJ SOLN
4.0000 [IU] | Freq: Three times a day (TID) | INTRAMUSCULAR | Status: DC
  Administered 2022-11-27 – 2022-11-28 (×4): 4 [IU] via SUBCUTANEOUS
  Filled 2022-11-27 (×4): qty 1

## 2022-11-27 MED ORDER — AMOXICILLIN-POT CLAVULANATE 875-125 MG PO TABS: 1.0000 | ORAL_TABLET | Freq: Two times a day (BID) | ORAL | 0 refills | Status: AC

## 2022-11-27 MED ORDER — TRAMADOL HCL 50 MG PO TABS: 50.0000 mg | ORAL_TABLET | Freq: Four times a day (QID) | ORAL | 0 refills | Status: DC | PRN

## 2022-11-27 NOTE — Discharge Summary (Signed)
Physician Discharge Summary   Patient: Jason [name] MRN: [id number] DOB: 1945-07-21  Admit date:     11/24/2022  Discharge date: 11/27/22  Discharge Physician: Arnetha Courser   PCP: Marina Goodell, MD   Recommendations at discharge:  Please obtain CBC and BMP on follow-up Patient is being discharged on 1 week of Augmentin and doxycycline-podiatry will make changes as appropriate Follow-up with podiatry next week Follow up with primary care provider  Discharge Diagnoses: Principal Problem:   Foot osteomyelitis, left (HCC) Active Problems:   PVD (peripheral vascular disease) (HCC)   Type 2 diabetes mellitus with hyperlipidemia (HCC)   Essential hypertension   History of CVA (cerebrovascular accident)   Hyperlipidemia, mixed   CAD (coronary artery disease)   Critical limb ischemia of left lower extremity (HCC)  Resolved Problems:   * No resolved hospital problems. [facility] Course: Mr. Jason [name] is a 89 male with history of CAD, history of CVA of the basilar artery, hyperlipidemia, hypertension, who presents to the emergency department from podiatry clinic for chief concerns of left fifth toe osteomyelitis.  Vitals in the ED showed temperature 98, respiration rate of 14, heart rate of 65, blood pressure 146/113, SpO2 of 100% on room air.  Serum sodium is 137, potassium 3.6, chloride 105, bicarb 27, BUN of 18, serum creatinine of 0.83, EGFR greater than 60, nonfasting blood glucose 197, WBC 7.3, hemoglobin 10, platelets of 214.  ED treatment: Vancomycin per pharmacy.  Podiatry and vascular surgery was consulted.  ABI ordered.  11/14: Hemodynamically stable.  Going for lower extremity angiography and as needed intervention with vascular surgery today.  11/15: Mildly elevated blood pressure, s/p lower extremity angiography and 2 stents placed in left peroneal and left anterior tibial by vascular surgery yesterday, underwent left partial fifth ray amputation with podiatry  today.  11/16: Blood pressure mildly elevated, added amlodipine to home ramipril.  Patient underwent hallux and fifth ray amputation by podiatry yesterday-tolerated the procedure well.  Podiatry changed the dressing and advised to be weightbearing on heel only and use surgical boot during ambulation.  PT evaluated him and recommended home health services which were ordered.  Intraoperative cultures with rare gram-positive cocci in pairs-pending final culture results.  Pathology pending.  Podiatry recommended discharging on Augmentin and doxycycline for 1 week and they will follow-up for final culture results as outpatient.  Patient will continue the rest of his home medications.  And follow-up with his providers for further management.  Assessment and Plan: * Foot osteomyelitis, left [facility]) Podiatry and vascular surgery was consulted, s/p angiography and PCI followed by partial left fifth ray amputation on 11/15 Preliminary cultures with rare gram-positive cocci. Podiatry cleared for discharge with partial weightbearing on heel and using surgical boot.  They recommended Augmentin and doxycycline for 1 week. -PT/OT evaluation -recommending home health which was ordered Continue with supportive care  PVD (peripheral vascular disease) (HCC) S/p lower extremity angiography and 2 stents placed in left peroneal and left anterior tibial by vascular surgery yesterday -Aspirin and Plavix were restarted -Continue statin  Type 2 diabetes mellitus with hyperlipidemia (HCC) Insulin dependent diabetes mellitus with at bedtime coverage ordered Basal can be added once patient is able to eat-currently n.p.o.  Essential hypertension Blood pressure mildly elevated. Ramipril 10 mg daily resumed on admission Added amlodipine 10 mg daily Hydralazine 5 mg every 6 hours as needed for SBP greater than 160, 4 days ordered  History of CVA (cerebrovascular accident) Home aspirin and Plavix not  resumed on  admission, as patient will likely need a procedure.  Hyperlipidemia, mixed Home rosuvastatin 20 mg daily resumed   Consultants: Podiatry Procedures performed: Lower extremity angiography and PCI, left fifth ray amputation Disposition: Home health Diet recommendation:  Discharge Diet Orders (From admission, onward)     Start     Ordered   11/27/22 0000  Diet - low sodium heart healthy        11/27/22 1451           Cardiac and Carb modified diet DISCHARGE MEDICATION: Allergies as of 11/27/2022   No Known Allergies      Medication List     STOP taking these medications    gabapentin 300 MG capsule Commonly known as: NEURONTIN   pravastatin 40 MG tablet Commonly known as: PRAVACHOL   propranolol 40 MG tablet Commonly known as: INDERAL       TAKE these medications    amLODipine 10 MG tablet Commonly known as: NORVASC Take 1 tablet (10 mg total) by mouth daily. Start taking on: November 28, 2022   amoxicillin-clavulanate 875-125 MG tablet Commonly known as: AUGMENTIN Take 1 tablet by mouth 2 (two) times daily for 7 days.   aspirin 81 MG tablet Take 81 mg by mouth daily.   CALCIUM 1200 PO Take by mouth daily.   clopidogrel 75 MG tablet Commonly known as: PLAVIX Take 75 mg by mouth daily.   docusate sodium 100 MG capsule Commonly known as: COLACE Take 1 capsule (100 mg total) by mouth 2 (two) times daily as needed for mild constipation.   doxycycline 100 MG tablet Commonly known as: VIBRA-TABS Take 1 tablet (100 mg total) by mouth 2 (two) times daily for 7 days.   FreeStyle Libre 14 Day Reader Hardie Pulley 1 Device. What changed: Another medication with the same name was removed. Continue taking this medication, and follow the directions you see here.   FreeStyle Libre 14 Day Sensor Misc SMARTSIG:1 Kit(s) Topical Every 2 Weeks   glimepiride 4 MG tablet Commonly known as: AMARYL Take 4 mg by mouth 2 (two) times daily.   insulin glargine 100 UNIT/ML  injection Commonly known as: LANTUS Inject 30 Units into the skin at bedtime.   insulin regular 100 units/mL injection Commonly known as: NOVOLIN R Inject 5-10 Units into the skin 3 (three) times daily before meals.   Magnesium 250 MG Tabs Take by mouth.   metFORMIN 1000 MG tablet Commonly known as: GLUCOPHAGE Take 1,000 mg by mouth 2 (two) times daily with a meal.   multivitamin capsule Take 1 capsule by mouth daily.   mupirocin ointment 2 % Commonly known as: BACTROBAN Place 1 Application into the nose 2 (two) times daily.   Potassium 99 MG Tabs Take by mouth daily.   povidone-iodine 10 % external solution Commonly known as: Betadine To apply and clean the wound 3 times a week.   ramipril 10 MG capsule Commonly known as: ALTACE Take 10 mg by mouth daily.   rosuvastatin 20 MG tablet Commonly known as: CRESTOR Take by mouth.   traMADol 50 MG tablet Commonly known as: Ultram Take 1 tablet (50 mg total) by mouth every 6 (six) hours as needed.               Discharge Care Instructions  (From admission, onward)           Start     Ordered   11/27/22 0000  Leave dressing on - Keep it clean, dry, and  intact until clinic visit        11/27/22 1451            Follow-up Information     Gwyneth Revels, DPM Follow up on 12/06/2022.   Specialty: Podiatry Why: For wound re-check Contact information: [address] [facility] [place] [address] [phone]                Discharge Exam: Ceasar Mons Weights   11/24/22 1437 11/26/22 1132  Weight: 98 kg 98 kg   General.  Overweight elderly man, in no acute distress. Pulmonary.  Lungs clear bilaterally, normal respiratory effort. CV.  Regular rate and rhythm, no JVD, rub or murmur. Abdomen.  Soft, nontender, nondistended, BS positive. CNS.  Alert and oriented .  No focal neurologic deficit. Extremities.  No edema,  Right TMA and left foot with Ace wrap Psychiatry.   Judgment and insight appears normal.   Condition at discharge: stable  The results of significant diagnostics from this hospitalization (including imaging, microbiology, ancillary and laboratory) are listed below for reference.   Imaging Studies: DG Foot Complete Left  Result Date: 11/26/2022 CLINICAL DATA:  [id number] Postop check [id number] EXAM: LEFT FOOT - COMPLETE 3+ VIEW COMPARISON:  None Available. 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 Sharp resection margin. Prior great toe amputation at the first MTP joint level. No evidence of an acute fracture. No dislocation. No focal erosion. Severe degenerative changes of the midfoot and hindfoot. Presumed postoperative changes within the soft tissues at the lateral aspect of the distal forefoot. 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 Electronically Signed   By: Duanne Guess D.O.   On: 11/26/2022 17:30   DG MINI C-ARM IMAGE ONLY  Result Date: 11/26/2022 There is no interpretation for this exam.  This order is for images obtained during a surgical procedure.  Please See "Surgeries" Tab for more information regarding the procedure.   US ARTERIAL ABI (SCREENING LOWER EXTREMITY)  Result Date: 11/25/2022 CLINICAL DATA:  77 year old male with left foot osteomyelitis. EXAM: NONINVASIVE PHYSIOLOGIC VASCULAR STUDY OF BILATERAL LOWER EXTREMITIES TECHNIQUE: Evaluation of both lower extremities were performed at rest, including calculation of ankle-brachial indices with single level Doppler, pressure and pulse volume recording. COMPARISON:  Lower extremity arterial duplex from 03/29/2020 FINDINGS: Right ABI:  1.04 Left ABI:  1.01 Right Lower Extremity:  Normal arterial waveforms at the ankle. Left Lower Extremity: Monophasic dorsalis pedis waveforms. Biphasic posterior tibial waveforms. IMPRESSION: Normal ankle-brachial indices. Consider  false elevation in the setting of diabetes and irregular arterial waveforms. Marliss Coots, MD [facility] Electronically Signed   By: Marliss Coots M.D.   On: 11/25/2022 16:50   PERIPHERAL VASCULAR CATHETERIZATION  Result Date: 11/25/2022 See surgical note for result.  DG Chest Port 1 View  Result Date: 11/24/2022 CLINICAL DATA:  [id number] Infection 24401 EXAM: PORTABLE CHEST 1 VIEW COMPARISON:  None Available. FINDINGS: The heart and mediastinal contours are within normal limits. Atherosclerotic plaque. No focal consolidation. No pulmonary edema. No pleural effusion. No pneumothorax. No acute osseous abnormality. Sternotomy wires are intact. IMPRESSION: 1. No active disease. 2.  Aortic Atherosclerosis (ICD10-I70.0). Electronically Signed   By: Tish Frederickson M.D.   On: 11/24/2022 17:20    Microbiology: Results for orders placed or performed during the hospital encounter of 11/24/22  Surgical PCR screen     Status: Abnormal   Collection  Time: 11/25/22  2:36 AM   Specimen: Nasal Mucosa; Nasal Swab  Result Value Ref Range Status   MRSA, PCR POSITIVE (A) NEGATIVE Final    Comment: RESULT CALLED TO, READ BACK BY AND VERIFIED WITH: YASMIN SORIANO@[id number]  11/25/22 RH    Staphylococcus aureus POSITIVE (A) NEGATIVE Final    Comment: (NOTE) The Xpert SA Assay (FDA approved for NASAL specimens in patients 26 years of age and older), is one component of a comprehensive surveillance program. It is not intended to diagnose infection nor to guide or monitor treatment. Performed at [facility], [address] [place], [place] [address]   Aerobic/Anaerobic Culture w Gram Stain (surgical/deep wound)     Status: None (Preliminary result)   Collection Time: 11/26/22 12:33 PM   Specimen: Foot, Left; Tissue  Result Value Ref Range Status   Specimen Description   Final    TISSUE Performed at [facility], [address]  [place], [place] [address]    Special Requests   Final    NONE Performed at [facility], [address] [place], [place] [address]    Gram Stain   Final    RARE WBC PRESENT, PREDOMINANTLY PMN RARE GRAM POSITIVE COCCI IN PAIRS    Culture   Final    CULTURE REINCUBATED FOR BETTER GROWTH Performed at [facility], [address], [place], [place] [address]    Report Status PENDING  Incomplete  Aerobic/Anaerobic Culture w Gram Stain (surgical/deep wound)     Status: None (Preliminary result)   Collection Time: 11/26/22 12:34 PM   Specimen: Path fluid; Tissue  Result Value Ref Range Status   Specimen Description   Final    BONE Performed at [facility], [address], [place], [place] [address]    Special Requests   Final    NONE Performed at [facility], [address] [place], [place] [address]    Gram Stain   Final    RARE WBC PRESENT, PREDOMINANTLY PMN RARE GRAM POSITIVE COCCI IN PAIRS    Culture   Final    CULTURE REINCUBATED FOR BETTER GROWTH Performed at [facility], [address] [place], [place] [address]    Report Status PENDING  Incomplete    Labs: CBC: Recent Labs  Lab 11/24/22 1444 11/25/22 0347  WBC 7.3 5.5  NEUTROABS 5.3  --   HGB 10.0* 9.8*  HCT 29.9* 29.3*  MCV 87.4 86.2  PLT 214 189   Basic Metabolic Panel: Recent Labs  Lab 11/24/22 1444 11/25/22 0347  NA 137 136  K 3.6 3.8  CL 105 105  CO2 27 26  GLUCOSE 197* 188*  BUN 18 14  CREATININE 0.83 0.80  CALCIUM 8.7* 8.4*   Liver Function Tests: Recent Labs  Lab 11/24/22 1444  AST 23  ALT 26  ALKPHOS 88  BILITOT 0.6  PROT 7.3  ALBUMIN 3.3*   CBG: Recent Labs  Lab 11/26/22 1306 11/26/22 1655 11/26/22 2103 11/27/22 0829 11/27/22 1148  GLUCAP 117* 133* 240* 294* 331*    Discharge time spent: greater than 30 minutes.  This record has been created using Conservation officer, historic buildings. Errors have been sought and corrected,but may not  always be located. Such creation errors do not reflect on the standard of care.   Signed: Arnetha Courser, MD [facility] 11/27/2022

## 2022-11-27 NOTE — Discharge Instructions (Addendum)
[facility]  POST OPERATIVE INSTRUCTIONS FOR DR. Ether Griffins AND DR. Aqueelah Cotrell [facility] CLINIC PODIATRY DEPARTMENT   Take your medication as prescribed.  Pain medication should be taken only as needed.  Keep the dressing clean, dry and intact.  Do not change dressing unless saturation or loosening occurs.  If this does occur remove dressing, applied nonadherent gauze such as Adaptic or Xeroform to the incisional area followed by 4 x 4 gauze, ABD pad, Kerlix, Ace wrap with mild compression.  Keep your foot elevated above the heart level for the first 48 hours.  Continue elevation thereafter for to improve swelling  Walking to the bathroom and brief periods of walking are acceptable, unless we have instructed you to be non-weight bearing.  Try to stay off of the left foot is much as possible and on the foot try to only put pressure on the heel for very short distances.  Always wear your post-op shoe when walking.  Always use your crutches if you are to be non-weight bearing.  Do not take a shower. Baths are permissible as long as the foot is kept out of the water.   Every hour you are awake:  Bend your knee 15 times. Flex foot 15 times Massage calf 15 times  Call [facility] [phone]) if any of the following problems occur: You develop a temperature or fever. The bandage becomes saturated with blood. Medication does not stop your pain. Injury of the foot occurs. Any symptoms of infection including redness, odor, or red streaks running from wound.

## 2022-11-27 NOTE — TOC Transition Note (Signed)
Transition of Care [facility]) - CM/SW Discharge Note   Patient Details  Name: Jason [name] MRN: [id number] Date of Birth: 02/07/1945  [facility]) CM/SW Contact:  Jason Quarry, RN Phone Number: 11/27/2022, 3:31 PM   Clinical Narrative:  11/16: Patient has discharge orders to home with home health. Spoke with patient, has not had HH before and did not have a preference. Discussed it may come to insurance acceptance and he was okay with that. He has a [facility]W DME at home already. Has PCP: Jason [name]. Transportation depends on the day but will be either on or daughter. Pt was unaware of discharge pending today and RN CM updated provider on this. May go tomorrow if transportation availability dictates. TOC will follow through final disposition/discharge/transportation.   Gabriel Cirri MSN RN CM  Care Management Department.  [place]  [facility] Direct Dial: [phone] Main Office Phone: [phone] Weekends Only      Final next level of care: Home w Home Health Services Barriers to Discharge: Barriers Resolved, No Barriers Identified   Patient Goals and CMS Choice CMS Medicare.gov Compare Post Acute Care list provided to:: Patient Choice offered to / list presented to : Patient  Discharge Placement                         Discharge Plan and Services Additional resources added to the After Visit Summary for                  DME Arranged: N/A DME Agency: NA       HH Arranged: PT, OT HH Agency: [facility] Date [facility] Agency Contacted: 11/27/22 Time HH Agency Contacted: 1530 Representative spoke with at [facility] Agency: Kandee Keen  Social Determinants of Health (SDOH) Interventions SDOH Screenings   Food Insecurity: No Food Insecurity (11/24/2022)  Housing: Low Risk  (11/24/2022)  Transportation Needs: No Transportation Needs (11/24/2022)  Utilities: Not At Risk (11/24/2022)  Tobacco Use: Low Risk  (11/26/2022)  Recent Concern: Tobacco Use -  Medium Risk (11/12/2022)   Received from [facility]     Readmission Risk Interventions     No data to display

## 2022-11-27 NOTE — Plan of Care (Signed)

## 2022-11-27 NOTE — Progress Notes (Signed)
ORTHOPAEDIC PROGRESS NOTE  REQUESTING PHYSICIAN: Arnetha Courser, MD  Chief Complaint: Ulceration with infection left fifth MTPJ.  HPI: Jason [name] is a 77 y.o. male who presents 1 day after undergoing a left partial fifth ray amputation.  He is remain partial weightbearing with heel contact since the procedure and has tried to stay off the foot is much as possible.  Patient currently denies nausea, vomiting, fevers, chills.  He notes no pain to the area at this time.  Past Medical History:  Diagnosis Date   Arthritis    CAD (coronary artery disease)    Colon polyp    Diabetes mellitus without complication (HCC)    Hyperlipemia    Hypertension    Orchitis    PVD (peripheral vascular disease) (HCC)    Stroke (cerebrum) (HCC)    Tremor    Wears dentures    Past Surgical History:  Procedure Laterality Date   ACHILLES TENDON SURGERY Right 04/02/2020   Procedure: ACHILLES LENGTHENING;  Surgeon: Rosetta Posner, DPM;  Location: [facility];  Service: Podiatry;  Laterality: Right;   AMPUTATION Right 08/21/2016   Procedure: RAY RESECTION-RIGHT 5TH RAY;  Surgeon: Linus Galas, DPM;  Location: [facility];  Service: Podiatry;  Laterality: Right;   AMPUTATION Left 11/26/2022   Procedure: PARTIAL 5TH RAY AMPUTATION;  Surgeon: Rosetta Posner, DPM;  Location: [facility];  Service: Orthopedics/Podiatry;  Laterality: Left;   AMPUTATION TOE Left    big   BONE EXOSTOSIS EXCISION Right 06/25/2021   Procedure: EXOSTOSIS RESECTION;  Surgeon: Rosetta Posner, DPM;  Location: [facility];  Service: Podiatry;  Laterality: Right;   CAROTID ENDARTERECTOMY Right    COLON SURGERY     CORONARY ARTERY BYPASS GRAFT     LOWER EXTREMITY ANGIOGRAPHY Right 03/31/2020   Procedure: Lower Extremity Angiography;  Surgeon: Annice Needy, MD;  Location: [facility];  Service: Cardiovascular;  Laterality: Right;   LOWER EXTREMITY ANGIOGRAPHY Left 11/25/2022   Procedure: Lower Extremity Angiography;  Surgeon: Annice Needy, MD;  Location: [facility];  Service: Cardiovascular;  Laterality: Left;   ROTATOR CUFF REPAIR Right    TRANSMETATARSAL AMPUTATION Right 04/02/2020   Procedure: TRANSMETATARSAL AMPUTATION;  Surgeon: Rosetta Posner, DPM;  Location: [facility];  Service: Podiatry;  Laterality: Right;   Social History   Socioeconomic History   Marital status: Widowed    Spouse name: Not on file   Number of children: Not on file   Years of education: Not on file   Highest education level: Not on file  Occupational History   Occupation: sales associate  Tobacco Use   Smoking status: Never   Smokeless tobacco: Never  Substance and Sexual Activity   Alcohol use: No   Drug use: No   Sexual activity: Not on file  Other Topics Concern   Not on file  Social History Narrative   Not on file   Social Determinants of Health   Financial Resource Strain: Not on file  Food Insecurity: No Food Insecurity (11/24/2022)   Hunger Vital Sign    Worried About Running Out of Food in the Last Year: Never true    Ran Out of Food in the Last Year: Never true  Transportation Needs: No Transportation Needs (11/24/2022)   PRAPARE - Administrator, Civil Service (Medical): No    Lack of Transportation (Non-Medical): No  Physical Activity: Not on file  Stress: Not on file  Social Connections: Not on file   Family  History  Problem Relation Age of Onset   Diabetes Mother    Diabetes Father    No Known Allergies Prior to Admission medications   Medication Sig Start Date End Date Taking? Authorizing Provider  aspirin 81 MG tablet Take 81 mg by mouth daily.   Yes [provider]  glimepiride (AMARYL) 4 MG tablet Take 4 mg by mouth 2 (two) times daily.    Yes [provider]  insulin glargine (LANTUS) 100 UNIT/ML injection Inject 30 Units into the skin at bedtime.   Yes [provider]  insulin regular (NOVOLIN R,HUMULIN R) 100 units/mL injection Inject 5-10 Units into  the skin 3 (three) times daily before meals.    Yes [provider]  ramipril (ALTACE) 10 MG capsule Take 10 mg by mouth daily.   Yes [provider]  Calcium Carbonate-Vit D-Min (CALCIUM 1200 PO) Take by mouth daily.    [provider]  clopidogrel (PLAVIX) 75 MG tablet Take 75 mg by mouth daily.    [provider]  Continuous Blood Gluc Receiver (FREESTYLE LIBRE 14 DAY READER) DEVI Use 1 Device as directed 05/15/19   [provider]  Continuous Blood Gluc Receiver (FREESTYLE LIBRE 14 DAY READER) DEVI See admin instructions. 05/19/19   [provider]  Continuous Blood Gluc Receiver (FREESTYLE LIBRE 14 DAY READER) DEVI 1 Device. 05/15/19   [provider]  Continuous Blood Gluc Sensor (FREESTYLE LIBRE 14 DAY SENSOR) MISC SMARTSIG:1 Kit(s) Topical Every 2 Weeks 02/21/20   [provider]  docusate sodium (COLACE) 100 MG capsule Take 1 capsule (100 mg total) by mouth 2 (two) times daily as needed for mild constipation. 08/24/16   Altamese Dilling, MD  gabapentin (NEURONTIN) 300 MG capsule Take 1 capsule (300 mg total) by mouth 3 (three) times daily for 14 days. Patient not taking: Reported on 06/12/2021 04/05/20 04/19/20  Tresa Moore, MD  Magnesium 250 MG TABS Take by mouth.    [provider]  metFORMIN (GLUCOPHAGE) 1000 MG tablet Take 1,000 mg by mouth 2 (two) times daily with a meal.    [provider]  Multiple Vitamin (MULTIVITAMIN) capsule Take 1 capsule by mouth daily.    [provider]  Potassium 99 MG TABS Take by mouth daily.    [provider]  povidone-iodine (BETADINE) 10 % external solution To apply and clean the wound 3 times a week. 08/24/16   Altamese Dilling, MD  pravastatin (PRAVACHOL) 40 MG tablet Take 40 mg by mouth daily. Patient not taking: Reported on 06/12/2021    [provider]  propranolol (INDERAL) 40 MG tablet Take 1 tablet (40 mg total) by mouth 3  (three) times daily. 08/24/16   Altamese Dilling, MD  rosuvastatin (CRESTOR) 20 MG tablet Take by mouth. 12/10/19 06/25/21  [provider]   DG Foot Complete Left  Result Date: 11/26/2022 CLINICAL DATA:  [id number] Postop check [id number] EXAM: LEFT FOOT - COMPLETE 3+ VIEW COMPARISON:  None Available. 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 Sharp resection margin. Prior great toe amputation at the first MTP joint level. No evidence of an acute fracture. No dislocation. No focal erosion. Severe degenerative changes of the midfoot and hindfoot. Presumed postoperative changes within the soft tissues at the lateral aspect of the distal forefoot. 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 Electronically Signed   By: Duanne Guess D.O.   On: 11/26/2022 17:30  DG MINI C-ARM IMAGE ONLY  Result Date: 11/26/2022 There is no interpretation for this exam.  This order is for images obtained during a surgical procedure.  Please See "Surgeries" Tab for more information regarding the procedure.   US ARTERIAL ABI (SCREENING LOWER EXTREMITY)  Result Date: 11/25/2022 CLINICAL DATA:  77 year old male with left foot osteomyelitis. EXAM: NONINVASIVE PHYSIOLOGIC VASCULAR STUDY OF BILATERAL LOWER EXTREMITIES TECHNIQUE: Evaluation of both lower extremities were performed at rest, including calculation of ankle-brachial indices with single level Doppler, pressure and pulse volume recording. COMPARISON:  Lower extremity arterial duplex from 03/29/2020 FINDINGS: Right ABI:  1.04 Left ABI:  1.01 Right Lower Extremity:  Normal arterial waveforms at the ankle. Left Lower Extremity: Monophasic dorsalis pedis waveforms. Biphasic posterior tibial waveforms. IMPRESSION: Normal ankle-brachial indices. Consider false elevation in the setting of diabetes and irregular arterial waveforms. Marliss Coots, MD  [facility] Electronically Signed   By: Marliss Coots M.D.   On: 11/25/2022 16:50   PERIPHERAL VASCULAR CATHETERIZATION  Result Date: 11/25/2022 See surgical note for result.   Positive ROS: All other systems have been reviewed and were otherwise negative with the exception of those mentioned in the HPI and as above.  12 point ROS was performed.  Physical Exam: General: Alert and oriented.  No apparent distress.  Vascular:  Left foot:Dorsalis Pedis:  absent Posterior Tibial:  absent  Right foot: Dorsalis Pedis:  thready Posterior Tibial:  thready  Neuro:absent protective sensation  Derm: Left partial fifth ray amputation site appears to be well coapted with sutures intact, capillary fill time appears to be intact flaps overall, mild hematoma present to the distal end of the incision site, reduction in erythema and edema present overall.    Ortho/MS: Left hallux and partial fifth ray amputation, right transmetatarsal amputation  Assessment: Necrotic left fifth MTPJ ulceration Osteomyelitis left fifth MTPJ PVD Uncontrolled diabetes type 2 apply neuropathy  Plan: -Patient seen and examined. -Incision site appears to be well coapted with sutures intact -Overall infection appears to be improved with amputation and believe that amputation removed any further infectious process.  Both wound swab and bone culture growing gram-positive cocci, path pending.  Appreciate medicine recommendations for antibiotic therapy.  Believe patient could be discharged on Augmentin and doxycycline for 7-day course.  Can follow-up on cultures and path report in outpatient clinic if discharged prior to getting the full report. -Reapplied dressing today consisting of Xeroform followed by 4 x 4 gauze, ABD, Kerlix, Ace wrap.  Patient can keep this dressing on for up to a week.  He should make follow-up within a week from today for examination of the  incision and dressing change..  Will place recommendations in chart and follow-up instructions. -Patient is to remain partial weightbearing with heel contact and postop shoe for very short distances and should not be on the foot for extended periods of time. -Appreciate vascular recommendations, may restart anticoagulation therapy at any time.  Podiatry team to sign off at this time and patient for discharge.  Rosetta Posner, DPM  11/27/2022 9:43 AM

## 2022-11-27 NOTE — Evaluation (Signed)
Physical Therapy Evaluation Patient Details Name: Jason [name] MRN: [id number] DOB: 1945/10/11 Today's Date: 11/27/2022  History of Present Illness  Jason [name] is a 77 y.o. male with PMH of osteomyelitis, diabetic foot ulceration, PVD, arthritis, CAD, diabetes, hypertension, hyperlipidemia, stroke, and tremor, s/p a left partial fifth ray amputation on 11/26/2022.  He is remain partial weightbearing with heel contact since the procedure and has tried to stay off the foot is much as possible.   Clinical Impression  Patient received in bed and agreeable to PT evaluation. He lives with his daughter in a 2 story home with 1 step to enter where he lives on the main floor only. Prior to hospitalization he ambulated independently and worked full time as a Holiday representative at [employer]. He reports being I with all aspects of care.  Upon PT evaluation patient was mod I for bed mobility and was able to don both of his shoes independently while sitting at edge of bed. He completed transfers with RW mod I and ambulated approx 150 feet with RW with supervision using step to gait and maintaining weight bearing precautions. He did lose his balance backwards once while turning and was educated on the importance of keeping his weight forwards on RW while walking since he has to keep heel only weight bearing and lacks a forefoot on the R LE. Patient would benefit from continued PT in the next venue of care to help him return to full time work. Patient would benefit from skilled physical therapy to address impairments and functional limitations (see PT Problem List below) to work towards stated goals and return to PLOF or maximal functional independence.        If plan is discharge home, recommend the following: Assist for transportation;Help with stairs or ramp for entrance;Assistance with cooking/housework   Can travel by private vehicle    yes    Equipment Recommendations None recommended by PT  Recommendations for  Other Services       Functional Status Assessment Patient has had a recent decline in their functional status and demonstrates the ability to make significant improvements in function in a reasonable and predictable amount of time.     Precautions / Restrictions Precautions Precautions: Fall Required Braces or Orthoses: Other Brace Other Brace: left post-op shoe Restrictions Weight Bearing Restrictions: Yes LLE Weight Bearing: Partial weight bearing LLE Partial Weight Bearing Percentage or Pounds: Heel contact only for short distances with postop shoe on left      Mobility  Bed Mobility Overal bed mobility: Independent                  Transfers Overall transfer level: Modified independent Equipment used: Rolling walker (2 wheels)                    Ambulation/Gait Ambulation/Gait assistance: Contact guard assist, Supervision Gait Distance (Feet): 150 Feet Assistive device: Rolling walker (2 wheels)         General Gait Details: patient ambulated approx 150 feet with RW and supervision, mostly using step to gait pattern leading with L LE. Able to maintain L LE weight bearing preacutions. He did have LOB backwards when turning and righted himself by leaning on nearby wall. Educated about maintaining forwards wight on RW to prevent LOB backwards while maintaining weight bearing precautions and lacking R forefoot.  Stairs            Wheelchair Mobility     Tilt Bed    Modified  Rankin (Stroke Patients Only)       Balance Overall balance assessment: Needs assistance   Sitting balance-Leahy Scale: Normal     Standing balance support: During functional activity, Bilateral upper extremity supported, Reliant on assistive device for balance Standing balance-Leahy Scale: Fair Standing balance comment: dependent on RW for balance when ambulating. one LOB backwards while turning due to allowing weight to get too far back.                              Pertinent Vitals/Pain Pain Assessment Pain Assessment: No/denies pain    Home Living Family/patient expects to be discharged to:: Private residence Living Arrangements: Children Available Help at Discharge: Available PRN/intermittently Type of Home: House Home Access: Stairs to enter Entrance Stairs-Rails: None Entrance Stairs-Number of Steps: 1   Home Layout: Multi-level;Able to live on main level with bedroom/bathroom Home Equipment: Rolling Walker (2 wheels)      Prior Function Prior Level of Function : Independent/Modified Independent;Driving;Working/employed;History of Falls (last six months)             Mobility Comments: Patient reports that prior to hospitalization he was I with ambulation without an AD and worked at [employer] full time as a Holiday representative. ADLs Comments: Pt reports being I with all aspects of care     Extremity/Trunk Assessment   Upper Extremity Assessment Upper Extremity Assessment: Overall WFL for tasks assessed    Lower Extremity Assessment Lower Extremity Assessment: Overall WFL for tasks assessed (except lacks right forefoot and is is PWB on left heel in post op shoe, which decreases his ankle strategy for balance.)    Cervical / Trunk Assessment Cervical / Trunk Assessment: Normal  Communication   Communication Communication: No apparent difficulties Cueing Techniques: Verbal cues;Gestural cues  Cognition Arousal: Alert Behavior During Therapy: WFL for tasks assessed/performed Overall Cognitive Status: Within Functional Limits for tasks assessed                                          General Comments      Exercises Other Exercises Other Exercises: pt educated on role of PT in acute care setting and discharge reccomendations. Educated pt on weight bearing precautions and balance precautions, use of RW.   Assessment/Plan    PT Assessment Patient needs continued PT services  PT Problem List Decreased  balance;Decreased activity tolerance;Decreased knowledge of use of DME;Obesity;Decreased mobility       PT Treatment Interventions DME instruction;Therapeutic exercise;Gait training;Balance training;Stair training;Neuromuscular re-education;Functional mobility training;Therapeutic activities;Patient/family education    PT Goals (Current goals can be found in the Care Plan section)  Acute Rehab PT Goals Patient Stated Goal: go home PT Goal Formulation: With patient Time For Goal Achievement: 12/11/22 Potential to Achieve Goals: Good    Frequency Min 2X/week     Co-evaluation               AM-PAC PT "6 Clicks" Mobility  Outcome Measure Help needed turning from your back to your side while in a flat bed without using bedrails?: None Help needed moving from lying on your back to sitting on the side of a flat bed without using bedrails?: None Help needed moving to and from a bed to a chair (including a wheelchair)?: A Little Help needed standing up from a chair using your arms (e.g., wheelchair or bedside  chair)?: A Little Help needed to walk in hospital room?: A Little Help needed climbing 3-5 steps with a railing? : A Little 6 Click Score: 20    End of Session Equipment Utilized During Treatment: Gait belt;Other (comment) (post op shoe on LLE) Activity Tolerance: Patient tolerated treatment well;No increased pain Patient left: in chair;with call bell/phone within reach;with chair alarm set Nurse Communication: Mobility status PT Visit Diagnosis: Difficulty in walking, not elsewhere classified (R26.2)    Time: 5956-3875 PT Time Calculation (min) (ACUTE ONLY): 36 min   Charges:   PT Evaluation $PT Eval Low Complexity: 1 Low PT Treatments $Gait Training: 8-22 mins PT General Charges $$ ACUTE PT VISIT: 1 Visit         Huntley Dec R. Ilsa Iha, PT, DPT 11/27/22, 2:49 PM

## 2022-11-28 LAB — GLUCOSE, CAPILLARY
Glucose-Capillary: 186 mg/dL — ABNORMAL HIGH (ref 70–99)
Glucose-Capillary: 275 mg/dL — ABNORMAL HIGH (ref 70–99)

## 2022-11-28 LAB — CREATININE, SERUM
Creatinine, Ser: 0.85 mg/dL (ref 0.61–1.24)
GFR, Estimated: >60 mL/min (ref >60)

## 2022-11-28 NOTE — TOC Transition Note (Signed)
Transition of Care [facility]) - CM/SW Discharge Note   Patient Details  Name: Jason [name] MRN: [id number] Date of Birth: 06/01/1945  [facility] CM/SW Contact:  Bing Quarry, RN Phone Number: 11/28/2022, 10:47 AM   Clinical Narrative: 11/17: Patient will discharge today due to transportation issues with family availability on 11/16. HH set up via [facility].  DME: Has RW at home.  Wound/surgical would dressing/care instructions in AVS. When to call provider at [facility] clinic also listed in AVS Summary.  Follow up with PCP: Dr Maudie Flakes.in ONE week. Follow up with Gwyneth Revels, Podiatry on 11/25 for wound recheck.     Transport home by son or daughter today.   Gabriel Cirri MSN RN CM  Care Management Department.  [place]  [id number] [facility] Direct Dial: [phone] Main Office Phone: [phone] Weekends Only       Final next level of care: Home w Home Health Services Barriers to Discharge: Barriers Resolved, No Barriers Identified   Patient Goals and CMS Choice CMS Medicare.gov Compare Post Acute Care list provided to:: Patient Choice offered to / list presented to : Patient  Discharge Placement                         Discharge Plan and Services Additional resources added to the After Visit Summary for                  DME Arranged: N/A DME Agency: NA       HH Arranged: PT, OT HH Agency: [facility] Date [facility] Agency Contacted: 11/27/22 Time HH Agency Contacted: 1530 Representative spoke with at [facility] Agency: Kandee Keen  Social Determinants of Health (SDOH) Interventions SDOH Screenings   Food Insecurity: No Food Insecurity (11/24/2022)  Housing: Low Risk  (11/24/2022)  Transportation Needs: No Transportation Needs (11/24/2022)  Utilities: Not At Risk (11/24/2022)  Tobacco Use: Low Risk  (11/26/2022)  Recent Concern: Tobacco Use - Medium Risk (11/12/2022)   Received from [facility]     Readmission Risk  Interventions     No data to display

## 2022-11-28 NOTE — Plan of Care (Signed)

## 2022-11-28 NOTE — Progress Notes (Signed)
Patient remained stable, he was discharged yesterday but unable to leave due to transportation issues. Family will pick up likely this afternoon.  No new changes were made to his medications and exam remained stable.

## 2022-11-28 NOTE — Progress Notes (Signed)
Patient d/c verified by teach back method, IV removed and tolerated well, patient was taken out by myself via wheelchair and patients son drove him home for d/c.

## 2022-11-30 DIAGNOSIS — E7800 Pure hypercholesterolemia, unspecified: Secondary | ICD-10-CM | POA: Diagnosis not present

## 2022-11-30 DIAGNOSIS — I679 Cerebrovascular disease, unspecified: Secondary | ICD-10-CM | POA: Diagnosis not present

## 2022-11-30 DIAGNOSIS — M129 Arthropathy, unspecified: Secondary | ICD-10-CM | POA: Diagnosis not present

## 2022-11-30 DIAGNOSIS — I10 Essential (primary) hypertension: Secondary | ICD-10-CM | POA: Diagnosis not present

## 2022-11-30 DIAGNOSIS — Z89412 Acquired absence of left great toe: Secondary | ICD-10-CM | POA: Diagnosis not present

## 2022-11-30 DIAGNOSIS — E1151 Type 2 diabetes mellitus with diabetic peripheral angiopathy without gangrene: Secondary | ICD-10-CM | POA: Diagnosis not present

## 2022-11-30 DIAGNOSIS — Z89431 Acquired absence of right foot: Secondary | ICD-10-CM | POA: Diagnosis not present

## 2022-11-30 DIAGNOSIS — S98132D Complete traumatic amputation of one left lesser toe, subsequent encounter: Secondary | ICD-10-CM | POA: Diagnosis not present

## 2022-12-01 DIAGNOSIS — Z4781 Encounter for orthopedic aftercare following surgical amputation: Secondary | ICD-10-CM | POA: Diagnosis not present

## 2022-12-01 DIAGNOSIS — I70222 Atherosclerosis of native arteries of extremities with rest pain, left leg: Secondary | ICD-10-CM | POA: Diagnosis not present

## 2022-12-01 DIAGNOSIS — I10 Essential (primary) hypertension: Secondary | ICD-10-CM | POA: Diagnosis not present

## 2022-12-01 DIAGNOSIS — E1151 Type 2 diabetes mellitus with diabetic peripheral angiopathy without gangrene: Secondary | ICD-10-CM | POA: Diagnosis not present

## 2022-12-01 DIAGNOSIS — I2510 Atherosclerotic heart disease of native coronary artery without angina pectoris: Secondary | ICD-10-CM | POA: Diagnosis not present

## 2022-12-01 DIAGNOSIS — Z89422 Acquired absence of other left toe(s): Secondary | ICD-10-CM | POA: Diagnosis not present

## 2022-12-01 DIAGNOSIS — M1990 Unspecified osteoarthritis, unspecified site: Secondary | ICD-10-CM | POA: Diagnosis not present

## 2022-12-01 DIAGNOSIS — I70201 Unspecified atherosclerosis of native arteries of extremities, right leg: Secondary | ICD-10-CM | POA: Diagnosis not present

## 2022-12-01 DIAGNOSIS — I700 Atherosclerosis of aorta: Secondary | ICD-10-CM | POA: Diagnosis not present

## 2022-12-01 LAB — AEROBIC/ANAEROBIC CULTURE W GRAM STAIN (SURGICAL/DEEP WOUND)

## 2022-12-03 LAB — SURGICAL PATHOLOGY

## 2022-12-08 DIAGNOSIS — E1142 Type 2 diabetes mellitus with diabetic polyneuropathy: Secondary | ICD-10-CM | POA: Diagnosis not present

## 2022-12-08 DIAGNOSIS — M86172 Other acute osteomyelitis, left ankle and foot: Secondary | ICD-10-CM | POA: Diagnosis not present

## 2022-12-14 DIAGNOSIS — I70222 Atherosclerosis of native arteries of extremities with rest pain, left leg: Secondary | ICD-10-CM | POA: Diagnosis not present

## 2022-12-14 DIAGNOSIS — Z89422 Acquired absence of other left toe(s): Secondary | ICD-10-CM | POA: Diagnosis not present

## 2022-12-14 DIAGNOSIS — M1990 Unspecified osteoarthritis, unspecified site: Secondary | ICD-10-CM | POA: Diagnosis not present

## 2022-12-14 DIAGNOSIS — Z4781 Encounter for orthopedic aftercare following surgical amputation: Secondary | ICD-10-CM | POA: Diagnosis not present

## 2022-12-14 DIAGNOSIS — I700 Atherosclerosis of aorta: Secondary | ICD-10-CM | POA: Diagnosis not present

## 2022-12-14 DIAGNOSIS — I70201 Unspecified atherosclerosis of native arteries of extremities, right leg: Secondary | ICD-10-CM | POA: Diagnosis not present

## 2022-12-14 DIAGNOSIS — I2510 Atherosclerotic heart disease of native coronary artery without angina pectoris: Secondary | ICD-10-CM | POA: Diagnosis not present

## 2022-12-14 DIAGNOSIS — E1151 Type 2 diabetes mellitus with diabetic peripheral angiopathy without gangrene: Secondary | ICD-10-CM | POA: Diagnosis not present

## 2022-12-14 DIAGNOSIS — I10 Essential (primary) hypertension: Secondary | ICD-10-CM | POA: Diagnosis not present

## 2022-12-17 DIAGNOSIS — I70201 Unspecified atherosclerosis of native arteries of extremities, right leg: Secondary | ICD-10-CM | POA: Diagnosis not present

## 2022-12-17 DIAGNOSIS — M1990 Unspecified osteoarthritis, unspecified site: Secondary | ICD-10-CM | POA: Diagnosis not present

## 2022-12-17 DIAGNOSIS — I10 Essential (primary) hypertension: Secondary | ICD-10-CM | POA: Diagnosis not present

## 2022-12-17 DIAGNOSIS — I700 Atherosclerosis of aorta: Secondary | ICD-10-CM | POA: Diagnosis not present

## 2022-12-17 DIAGNOSIS — I70222 Atherosclerosis of native arteries of extremities with rest pain, left leg: Secondary | ICD-10-CM | POA: Diagnosis not present

## 2022-12-17 DIAGNOSIS — I2510 Atherosclerotic heart disease of native coronary artery without angina pectoris: Secondary | ICD-10-CM | POA: Diagnosis not present

## 2022-12-17 DIAGNOSIS — Z4781 Encounter for orthopedic aftercare following surgical amputation: Secondary | ICD-10-CM | POA: Diagnosis not present

## 2022-12-17 DIAGNOSIS — Z89422 Acquired absence of other left toe(s): Secondary | ICD-10-CM | POA: Diagnosis not present

## 2022-12-17 DIAGNOSIS — E1151 Type 2 diabetes mellitus with diabetic peripheral angiopathy without gangrene: Secondary | ICD-10-CM | POA: Diagnosis not present

## 2022-12-23 DIAGNOSIS — Z89422 Acquired absence of other left toe(s): Secondary | ICD-10-CM | POA: Diagnosis not present

## 2022-12-23 DIAGNOSIS — I700 Atherosclerosis of aorta: Secondary | ICD-10-CM | POA: Diagnosis not present

## 2022-12-23 DIAGNOSIS — Z4781 Encounter for orthopedic aftercare following surgical amputation: Secondary | ICD-10-CM | POA: Diagnosis not present

## 2022-12-23 DIAGNOSIS — E1151 Type 2 diabetes mellitus with diabetic peripheral angiopathy without gangrene: Secondary | ICD-10-CM | POA: Diagnosis not present

## 2022-12-23 DIAGNOSIS — I10 Essential (primary) hypertension: Secondary | ICD-10-CM | POA: Diagnosis not present

## 2022-12-23 DIAGNOSIS — I70201 Unspecified atherosclerosis of native arteries of extremities, right leg: Secondary | ICD-10-CM | POA: Diagnosis not present

## 2022-12-23 DIAGNOSIS — I70222 Atherosclerosis of native arteries of extremities with rest pain, left leg: Secondary | ICD-10-CM | POA: Diagnosis not present

## 2022-12-23 DIAGNOSIS — I2510 Atherosclerotic heart disease of native coronary artery without angina pectoris: Secondary | ICD-10-CM | POA: Diagnosis not present

## 2022-12-23 DIAGNOSIS — M1990 Unspecified osteoarthritis, unspecified site: Secondary | ICD-10-CM | POA: Diagnosis not present

## 2023-01-04 DIAGNOSIS — E1142 Type 2 diabetes mellitus with diabetic polyneuropathy: Secondary | ICD-10-CM | POA: Diagnosis not present

## 2023-01-04 DIAGNOSIS — B351 Tinea unguium: Secondary | ICD-10-CM | POA: Diagnosis not present

## 2023-01-04 DIAGNOSIS — L851 Acquired keratosis [keratoderma] palmaris et plantaris: Secondary | ICD-10-CM | POA: Diagnosis not present

## 2023-04-12 DIAGNOSIS — Z89422 Acquired absence of other left toe(s): Secondary | ICD-10-CM | POA: Diagnosis not present

## 2023-04-12 DIAGNOSIS — I739 Peripheral vascular disease, unspecified: Secondary | ICD-10-CM | POA: Diagnosis not present

## 2023-04-12 DIAGNOSIS — E1142 Type 2 diabetes mellitus with diabetic polyneuropathy: Secondary | ICD-10-CM | POA: Diagnosis not present

## 2023-04-12 DIAGNOSIS — Z89431 Acquired absence of right foot: Secondary | ICD-10-CM | POA: Diagnosis not present

## 2023-04-12 DIAGNOSIS — B351 Tinea unguium: Secondary | ICD-10-CM | POA: Diagnosis not present

## 2023-04-12 DIAGNOSIS — L851 Acquired keratosis [keratoderma] palmaris et plantaris: Secondary | ICD-10-CM | POA: Diagnosis not present

## 2023-05-20 DIAGNOSIS — E785 Hyperlipidemia, unspecified: Secondary | ICD-10-CM | POA: Diagnosis not present

## 2023-05-20 DIAGNOSIS — E1142 Type 2 diabetes mellitus with diabetic polyneuropathy: Secondary | ICD-10-CM | POA: Diagnosis not present

## 2023-05-20 DIAGNOSIS — Z794 Long term (current) use of insulin: Secondary | ICD-10-CM | POA: Diagnosis not present

## 2023-05-20 DIAGNOSIS — E1169 Type 2 diabetes mellitus with other specified complication: Secondary | ICD-10-CM | POA: Diagnosis not present

## 2023-05-20 DIAGNOSIS — E1159 Type 2 diabetes mellitus with other circulatory complications: Secondary | ICD-10-CM | POA: Diagnosis not present

## 2023-05-20 DIAGNOSIS — I152 Hypertension secondary to endocrine disorders: Secondary | ICD-10-CM | POA: Diagnosis not present

## 2023-05-27 ENCOUNTER — Inpatient Hospital Stay
Admission: EM | Admit: 2023-05-27 | Discharge: 2023-05-31 | DRG: 638 | Disposition: A | Discharge status: Home Health | Attending: Internal Medicine | Admitting: Internal Medicine

## 2023-05-27 ENCOUNTER — Observation Stay

## 2023-05-27 ENCOUNTER — Other Ambulatory Visit: Payer: Self-pay

## 2023-05-27 DIAGNOSIS — E1142 Type 2 diabetes mellitus with diabetic polyneuropathy: Secondary | ICD-10-CM

## 2023-05-27 DIAGNOSIS — Z89412 Acquired absence of left great toe: Secondary | ICD-10-CM

## 2023-05-27 DIAGNOSIS — E119 Type 2 diabetes mellitus without complications: Secondary | ICD-10-CM | POA: Diagnosis not present

## 2023-05-27 DIAGNOSIS — E11621 Type 2 diabetes mellitus with foot ulcer: Secondary | ICD-10-CM | POA: Diagnosis present

## 2023-05-27 DIAGNOSIS — Z8673 Personal history of transient ischemic attack (TIA), and cerebral infarction without residual deficits: Secondary | ICD-10-CM | POA: Diagnosis not present

## 2023-05-27 DIAGNOSIS — Z89411 Acquired absence of right great toe: Secondary | ICD-10-CM

## 2023-05-27 DIAGNOSIS — E11628 Type 2 diabetes mellitus with other skin complications: Principal | ICD-10-CM | POA: Diagnosis present

## 2023-05-27 DIAGNOSIS — R600 Localized edema: Secondary | ICD-10-CM | POA: Diagnosis not present

## 2023-05-27 DIAGNOSIS — Z5986 Financial insecurity: Secondary | ICD-10-CM

## 2023-05-27 DIAGNOSIS — Z860100 Personal history of colon polyps, unspecified: Secondary | ICD-10-CM | POA: Diagnosis not present

## 2023-05-27 DIAGNOSIS — I2510 Atherosclerotic heart disease of native coronary artery without angina pectoris: Secondary | ICD-10-CM | POA: Diagnosis present

## 2023-05-27 DIAGNOSIS — Z79899 Other long term (current) drug therapy: Secondary | ICD-10-CM | POA: Diagnosis not present

## 2023-05-27 DIAGNOSIS — Z794 Long term (current) use of insulin: Secondary | ICD-10-CM | POA: Diagnosis not present

## 2023-05-27 DIAGNOSIS — Z833 Family history of diabetes mellitus: Secondary | ICD-10-CM | POA: Diagnosis not present

## 2023-05-27 DIAGNOSIS — I10 Essential (primary) hypertension: Secondary | ICD-10-CM | POA: Diagnosis present

## 2023-05-27 DIAGNOSIS — Z95820 Peripheral vascular angioplasty status with implants and grafts: Secondary | ICD-10-CM

## 2023-05-27 DIAGNOSIS — Z7902 Long term (current) use of antithrombotics/antiplatelets: Secondary | ICD-10-CM | POA: Diagnosis not present

## 2023-05-27 DIAGNOSIS — M19071 Primary osteoarthritis, right ankle and foot: Secondary | ICD-10-CM | POA: Diagnosis not present

## 2023-05-27 DIAGNOSIS — E1165 Type 2 diabetes mellitus with hyperglycemia: Secondary | ICD-10-CM | POA: Diagnosis present

## 2023-05-27 DIAGNOSIS — M7989 Other specified soft tissue disorders: Secondary | ICD-10-CM | POA: Diagnosis not present

## 2023-05-27 DIAGNOSIS — L02415 Cutaneous abscess of right lower limb: Secondary | ICD-10-CM | POA: Diagnosis present

## 2023-05-27 DIAGNOSIS — Z7982 Long term (current) use of aspirin: Secondary | ICD-10-CM | POA: Diagnosis not present

## 2023-05-27 DIAGNOSIS — I739 Peripheral vascular disease, unspecified: Secondary | ICD-10-CM | POA: Diagnosis present

## 2023-05-27 DIAGNOSIS — Z89421 Acquired absence of other right toe(s): Secondary | ICD-10-CM

## 2023-05-27 DIAGNOSIS — L03115 Cellulitis of right lower limb: Secondary | ICD-10-CM | POA: Diagnosis present

## 2023-05-27 DIAGNOSIS — E785 Hyperlipidemia, unspecified: Secondary | ICD-10-CM | POA: Diagnosis present

## 2023-05-27 DIAGNOSIS — Z951 Presence of aortocoronary bypass graft: Secondary | ICD-10-CM

## 2023-05-27 DIAGNOSIS — L97512 Non-pressure chronic ulcer of other part of right foot with fat layer exposed: Secondary | ICD-10-CM | POA: Diagnosis not present

## 2023-05-27 DIAGNOSIS — L0390 Cellulitis, unspecified: Secondary | ICD-10-CM | POA: Diagnosis not present

## 2023-05-27 DIAGNOSIS — E1151 Type 2 diabetes mellitus with diabetic peripheral angiopathy without gangrene: Secondary | ICD-10-CM | POA: Diagnosis present

## 2023-05-27 DIAGNOSIS — M7731 Calcaneal spur, right foot: Secondary | ICD-10-CM | POA: Diagnosis not present

## 2023-05-27 DIAGNOSIS — Z7984 Long term (current) use of oral hypoglycemic drugs: Secondary | ICD-10-CM

## 2023-05-27 DIAGNOSIS — L97519 Non-pressure chronic ulcer of other part of right foot with unspecified severity: Secondary | ICD-10-CM | POA: Diagnosis present

## 2023-05-27 LAB — GLUCOSE, CAPILLARY: Glucose-Capillary: 238 mg/dL — ABNORMAL HIGH (ref 70–99)

## 2023-05-27 LAB — CBC
HCT: 35.4 % — ABNORMAL LOW (ref 39.0–52.0)
Hemoglobin: 11.7 g/dL — ABNORMAL LOW (ref 13.0–17.0)
MCH: 28.8 pg (ref 26.0–34.0)
MCHC: 33.1 g/dL (ref 30.0–36.0)
MCV: 87.2 fL (ref 80.0–100.0)
Platelets: 260 10*3/uL (ref 150–400)
RBC: 4.06 MIL/uL — ABNORMAL LOW (ref 4.22–5.81)
RDW: 12 % (ref 11.5–15.5)
WBC: 6.8 10*3/uL (ref 4.0–10.5)
nRBC: 0 % (ref 0.0–0.2)

## 2023-05-27 LAB — BASIC METABOLIC PANEL WITH GFR
Anion gap: 10 (ref 5–15)
BUN: 20 mg/dL (ref 8–23)
CO2: 25 mmol/L (ref 22–32)
Calcium: 8.9 mg/dL (ref 8.9–10.3)
Chloride: 101 mmol/L (ref 98–111)
Creatinine, Ser: 1.08 mg/dL (ref 0.61–1.24)
GFR, Estimated: >60 mL/min (ref >60)
Glucose, Bld: 252 mg/dL — ABNORMAL HIGH (ref 70–99)
Potassium: 3.8 mmol/L (ref 3.5–5.1)
Sodium: 136 mmol/L (ref 135–145)

## 2023-05-27 LAB — LACTIC ACID, PLASMA
Lactic Acid, Venous: 1.7 mmol/L (ref 0.5–1.9)
Lactic Acid, Venous: 1.2 mmol/L (ref 0.5–1.9)

## 2023-05-27 MED ORDER — VANCOMYCIN HCL 2000 MG/400ML IV SOLN
2000.0000 mg | Freq: Once | INTRAVENOUS | Status: DC
  Filled 2023-05-27: qty 400

## 2023-05-27 MED ORDER — INSULIN GLARGINE-YFGN 100 UNIT/ML ~~LOC~~ SOLN
15.0000 [IU] | Freq: Every day | SUBCUTANEOUS | Status: DC
  Administered 2023-05-27 – 2023-05-28 (×2): 15 [IU] via SUBCUTANEOUS
  Filled 2023-05-27 (×3): qty 0.15

## 2023-05-27 MED ORDER — INSULIN ASPART 100 UNIT/ML IJ SOLN
0.0000 [IU] | Freq: Three times a day (TID) | INTRAMUSCULAR | Status: DC
  Administered 2023-05-28: 3 [IU] via SUBCUTANEOUS
  Administered 2023-05-28 (×2): 5 [IU] via SUBCUTANEOUS
  Administered 2023-05-29: 8 [IU] via SUBCUTANEOUS
  Administered 2023-05-29 – 2023-05-30 (×2): 3 [IU] via SUBCUTANEOUS
  Administered 2023-05-30: 2 [IU] via SUBCUTANEOUS
  Administered 2023-05-30 – 2023-05-31 (×2): 3 [IU] via SUBCUTANEOUS
  Administered 2023-05-31: 2 [IU] via SUBCUTANEOUS
  Administered 2023-05-31: 3 [IU] via SUBCUTANEOUS
  Filled 2023-05-27 (×11): qty 1

## 2023-05-27 MED ORDER — ONDANSETRON HCL 4 MG/2ML IJ SOLN: 4.0000 mg | Freq: Four times a day (QID) | INTRAMUSCULAR | Status: DC | PRN

## 2023-05-27 MED ORDER — RAMIPRIL 10 MG PO CAPS
10.0000 mg | ORAL_CAPSULE | Freq: Every day | ORAL | Status: DC
  Filled 2023-05-27: qty 1

## 2023-05-27 MED ORDER — CLOPIDOGREL BISULFATE 75 MG PO TABS
75.0000 mg | ORAL_TABLET | Freq: Every day | ORAL | Status: DC
  Administered 2023-05-28 – 2023-05-31 (×4): 75 mg via ORAL
  Filled 2023-05-27 (×4): qty 1

## 2023-05-27 MED ORDER — ROSUVASTATIN CALCIUM 20 MG PO TABS
20.0000 mg | ORAL_TABLET | Freq: Every day | ORAL | Status: DC
  Administered 2023-05-28 – 2023-05-31 (×4): 20 mg via ORAL
  Filled 2023-05-27 (×4): qty 1

## 2023-05-27 MED ORDER — ONDANSETRON HCL 4 MG PO TABS: 4.0000 mg | ORAL_TABLET | Freq: Four times a day (QID) | ORAL | Status: DC | PRN

## 2023-05-27 MED ORDER — METRONIDAZOLE 500 MG/100ML IV SOLN
500.0000 mg | Freq: Once | INTRAVENOUS | Status: AC
Start: 2023-05-27 — End: 2023-05-27
  Administered 2023-05-27: 500 mg via INTRAVENOUS
  Filled 2023-05-27: qty 100

## 2023-05-27 MED ORDER — HYDRALAZINE HCL 20 MG/ML IJ SOLN
5.0000 mg | INTRAMUSCULAR | Status: DC | PRN
  Administered 2023-05-27: 5 mg via INTRAVENOUS
  Filled 2023-05-27 (×2): qty 1

## 2023-05-27 MED ORDER — ASPIRIN 81 MG PO TBEC
81.0000 mg | DELAYED_RELEASE_TABLET | Freq: Every day | ORAL | Status: DC
  Administered 2023-05-28 – 2023-05-31 (×4): 81 mg via ORAL
  Filled 2023-05-27 (×4): qty 1

## 2023-05-27 MED ORDER — AMLODIPINE BESYLATE 5 MG PO TABS: 10.0000 mg | ORAL_TABLET | Freq: Every day | ORAL | Status: DC

## 2023-05-27 MED ORDER — MORPHINE SULFATE (PF) 2 MG/ML IV SOLN: 2.0000 mg | INTRAVENOUS | Status: DC | PRN

## 2023-05-27 MED ORDER — HYDROCODONE-ACETAMINOPHEN 5-325 MG PO TABS: 1.0000 | ORAL_TABLET | ORAL | Status: DC | PRN

## 2023-05-27 MED ORDER — SODIUM CHLORIDE 0.9 % IV SOLN
2.0000 g | Freq: Once | INTRAVENOUS | Status: AC
  Administered 2023-05-27: 2 g via INTRAVENOUS
  Filled 2023-05-27: qty 12.5

## 2023-05-27 MED ORDER — ALBUTEROL SULFATE (2.5 MG/3ML) 0.083% IN NEBU: 2.5000 mg | INHALATION_SOLUTION | RESPIRATORY_TRACT | Status: DC | PRN

## 2023-05-27 MED ORDER — RAMIPRIL 5 MG PO CAPS
10.0000 mg | ORAL_CAPSULE | Freq: Every day | ORAL | Status: DC
  Administered 2023-05-27 – 2023-05-31 (×5): 10 mg via ORAL
  Filled 2023-05-27 (×5): qty 2

## 2023-05-27 MED ORDER — ACETAMINOPHEN 325 MG PO TABS
650.0000 mg | ORAL_TABLET | Freq: Four times a day (QID) | ORAL | Status: DC | PRN
Start: 2023-05-27 — End: 2023-05-31

## 2023-05-27 MED ORDER — ENOXAPARIN SODIUM 60 MG/0.6ML IJ SOSY
45.0000 mg | PREFILLED_SYRINGE | INTRAMUSCULAR | Status: DC
  Administered 2023-05-27 – 2023-05-30 (×4): 45 mg via SUBCUTANEOUS
  Filled 2023-05-27 (×4): qty 0.6

## 2023-05-27 MED ORDER — ACETAMINOPHEN 650 MG RE SUPP: 650.0000 mg | Freq: Four times a day (QID) | RECTAL | Status: DC | PRN

## 2023-05-27 MED ORDER — CALCIUM 1200 1200-1000 MG-UNIT PO CHEW: CHEWABLE_TABLET | Freq: Every day | ORAL | Status: DC

## 2023-05-27 MED ORDER — PROPRANOLOL HCL ER 60 MG PO CP24
60.0000 mg | ORAL_CAPSULE | Freq: Every day | ORAL | Status: DC
  Administered 2023-05-28 – 2023-05-31 (×4): 60 mg via ORAL
  Filled 2023-05-27 (×4): qty 1

## 2023-05-27 MED ORDER — OYSTER SHELL CALCIUM/D3 500-5 MG-MCG PO TABS
2.0000 | ORAL_TABLET | Freq: Every day | ORAL | Status: DC
  Administered 2023-05-28 – 2023-05-31 (×4): 2 via ORAL
  Filled 2023-05-27 (×4): qty 2

## 2023-05-27 MED ORDER — SODIUM CHLORIDE 0.9 % IV SOLN
1.0000 g | INTRAVENOUS | Status: DC
  Administered 2023-05-28 – 2023-05-31 (×4): 1 g via INTRAVENOUS
  Filled 2023-05-27 (×4): qty 10

## 2023-05-27 MED ORDER — VANCOMYCIN HCL 2000 MG/400ML IV SOLN
2000.0000 mg | Freq: Once | INTRAVENOUS | Status: AC
  Administered 2023-05-27: 2000 mg via INTRAVENOUS
  Filled 2023-05-27: qty 400

## 2023-05-27 MED ORDER — VANCOMYCIN HCL IN DEXTROSE 1-5 GM/200ML-% IV SOLN
1000.0000 mg | Freq: Once | INTRAVENOUS | Status: DC
  Filled 2023-05-27: qty 200

## 2023-05-27 MED ORDER — SODIUM CHLORIDE 0.9 % IV SOLN: 1.0000 g | INTRAVENOUS | Status: DC

## 2023-05-27 MED ORDER — INSULIN ASPART 100 UNIT/ML IJ SOLN
0.0000 [IU] | Freq: Every day | INTRAMUSCULAR | Status: DC
  Administered 2023-05-27 – 2023-05-29 (×3): 2 [IU] via SUBCUTANEOUS
  Filled 2023-05-27 (×3): qty 1

## 2023-05-27 NOTE — Assessment & Plan Note (Signed)
 Continue aspirin and rosuvastatin

## 2023-05-27 NOTE — ED Triage Notes (Signed)
 Pt comes with right leg pain. Pt states he has cellulitis. Pt states swelling and redness with warmth. Pt states this started couple days ago.

## 2023-05-27 NOTE — ED Provider Notes (Signed)
 [facility] Provider Note    Event Date/Time   First MD Initiated Contact with Patient 05/27/23 1817     (approximate)   History   Chief Complaint: Leg Pain   HPI  Jason [name] is a 78 y.o. male with a history of hypertension, diabetes, prior right foot TMA who was sent to the ED by his podiatrist Dr. Althea Atkinson due to redness swelling and pain of the right lower leg.  Patient reports that it started 2 days ago and has worsened.  Denies fever chest pain or shortness of breath.  No dizziness or syncope.        Past Medical History:  Diagnosis Date   Arthritis    CAD (coronary artery disease)    Colon polyp    Diabetes mellitus without complication (HCC)    Hyperlipemia    Hypertension    Orchitis    PVD (peripheral vascular disease) (HCC)    Stroke (cerebrum) [facility])    Tremor    Wears dentures     Current Outpatient Rx   Order #: [id number] Class: Normal   Order #: [id number] Class: Historical Med   Order #: [id number] Class: Historical Med   Order #: [id number] Class: Historical Med   Order #: [id number] Class: Historical Med   Order #: [id number] Class: Historical Med   Order #: [id number] Class: Normal   Order #: [id number] Class: Historical Med   Order #: [id number] Class: Historical Med   Order #: [id number] Class: Historical Med   Order #: [id number] Class: Historical Med   Order #: [id number] Class: Historical Med   Order #: [id number] Class: Historical Med   Order #: [id number] Class: No Print   Order #: [id number] Class: Historical Med   Order #: [id number] Class: Print   Order #: [id number] Class: Historical Med   Order #: [id number] Class: Historical Med   Order #: [id number] Class: Normal    Past Surgical History:  Procedure Laterality Date   ACHILLES TENDON SURGERY Right 04/02/2020   Procedure: ACHILLES LENGTHENING;  Surgeon: Pink Bridges, DPM;  Location: [facility] ORS;  Service: Podiatry;  Laterality: Right;   AMPUTATION Right 08/21/2016   Procedure: RAY  RESECTION-RIGHT 5TH RAY;  Surgeon: Angel Barba, DPM;  Location: [facility];  Service: Podiatry;  Laterality: Right;   AMPUTATION Left 11/26/2022   Procedure: PARTIAL 5TH RAY AMPUTATION;  Surgeon: Pink Bridges, DPM;  Location: [facility];  Service: Orthopedics/Podiatry;  Laterality: Left;   AMPUTATION TOE Left    big   BONE EXOSTOSIS EXCISION Right 06/25/2021   Procedure: EXOSTOSIS RESECTION;  Surgeon: Pink Bridges, DPM;  Location: [facility];  Service: Podiatry;  Laterality: Right;   CAROTID ENDARTERECTOMY Right    COLON SURGERY     CORONARY ARTERY BYPASS GRAFT     LOWER EXTREMITY ANGIOGRAPHY Right 03/31/2020   Procedure: Lower Extremity Angiography;  Surgeon: Celso College, MD;  Location: [facility];  Service: Cardiovascular;  Laterality: Right;   LOWER EXTREMITY ANGIOGRAPHY Left 11/25/2022   Procedure: Lower Extremity Angiography;  Surgeon: Celso College, MD;  Location: [facility];  Service: Cardiovascular;  Laterality: Left;   ROTATOR CUFF REPAIR Right    TRANSMETATARSAL AMPUTATION Right 04/02/2020   Procedure: TRANSMETATARSAL AMPUTATION;  Surgeon: Pink Bridges, DPM;  Location: [facility];  Service: Podiatry;  Laterality: Right;    Physical Exam   Triage Vital Signs: ED Triage Vitals  Encounter Vitals Group     BP 05/27/23 1532 (!) 156/65     Systolic BP Percentile --      Diastolic BP Percentile --  Pulse Rate 05/27/23 1532 70     Resp 05/27/23 1532 18     Temp 05/27/23 1532 98.4 F (36.9 C)     Temp src --      SpO2 05/27/23 1532 97 %     Weight 05/27/23 1531 215 lb (97.5 kg)     Height 05/27/23 1531 5\' 10"  (1.778 m)     Head Circumference --      Peak Flow --      Pain Score 05/27/23 1531 0     Pain Loc --      Pain Education --      Exclude from Growth Chart --     Most recent vital signs: Vitals:   05/27/23 1926 05/27/23 1931  BP: (!) 204/91 (!) 190/75  Pulse: 64 63  Resp: 18   Temp: 98.5 F (36.9 C)   SpO2: 100% 100%     General: Awake, no distress.  CV:  Good peripheral perfusion.  Regular rate and rhythm Resp:  Normal effort.  Abd:  No distention.  Other:  Erythema, edema, warm, tenderness from distal right foot up to the mid calf.  No fluctuance or crepitus.  There is a small superficial wound just proximal to the right lateral malleolus without drainage.  There is a plantar ulceration at the head of the 2nd and 3rd metatarsals without drainage, but with surrounding macerated skin.   ED Results / Procedures / Treatments   Labs (all labs ordered are listed, but only abnormal results are displayed) Labs Reviewed  BASIC METABOLIC PANEL WITH GFR - Abnormal; Notable for the following components:      Result Value   Glucose, Bld 252 (*)    All other components within normal limits  CBC - Abnormal; Notable for the following components:   RBC 4.06 (*)    Hemoglobin 11.7 (*)    HCT 35.4 (*)    All other components within normal limits  CULTURE, BLOOD (ROUTINE X 2)  CULTURE, BLOOD (ROUTINE X 2)  LACTIC ACID, PLASMA  LACTIC ACID, PLASMA  CBC  BASIC METABOLIC PANEL WITH GFR  HEMOGLOBIN A1C     EKG    RADIOLOGY X-ray right interpreted by me, no apparent osteomyelitis, no subcutaneous emphysema.  Radiology report reviewed   PROCEDURES:  Procedures   MEDICATIONS ORDERED IN ED: Medications  ceFEPIme  (MAXIPIME ) 2 g in sodium chloride  0.9 % 100 mL IVPB (2 g Intravenous New Bag/Given 05/27/23 1924)  metroNIDAZOLE (FLAGYL) IVPB 500 mg (has no administration in time range)  vancomycin  (VANCOCIN ) IVPB 1000 mg/200 mL premix (has no administration in time range)  enoxaparin  (LOVENOX ) injection 45 mg (has no administration in time range)  acetaminophen  (TYLENOL ) tablet 650 mg (has no administration in time range)    Or  acetaminophen  (TYLENOL ) suppository 650 mg (has no administration in time range)  HYDROcodone -acetaminophen  (NORCO/VICODIN) 5-325 MG per tablet 1-2 tablet (has no administration in  time range)  morphine  (PF) 2 MG/ML injection 2 mg (has no administration in time range)  ondansetron  (ZOFRAN ) tablet 4 mg (has no administration in time range)    Or  ondansetron  (ZOFRAN ) injection 4 mg (has no administration in time range)  albuterol (PROVENTIL) (2.5 MG/3ML) 0.083% nebulizer solution 2.5 mg (has no administration in time range)  insulin  aspart (novoLOG ) injection 0-15 Units (has no administration in time range)  insulin  aspart (novoLOG ) injection 0-5 Units (has no administration in time range)  cefTRIAXone (ROCEPHIN) 1 g in sodium chloride  0.9 % 100 mL IVPB (  has no administration in time range)     IMPRESSION / MDM / ASSESSMENT AND PLAN / ED COURSE  I reviewed the triage vital signs and the nursing notes.  DDx: Cellulitis, osteomyelitis, foot abscess, metatarsal fracture, leukocytosis.  Doubt necrotizing fasciitis or limb ischemia  Patient's presentation is most consistent with acute presentation with potential threat to life or bodily function.  Patient presents with a large area of cellulitis of the right lower leg in the setting of insulin -dependent diabetes.  He has previously had partial amputation of the right foot, now has distal soft tissue ulceration.  Will obtain x-ray of the foot, start IV antibiotics to include MRSA and Pseudomonas coverage, and plan to admit.  Not septic on arrival.   Clinical Course as of 05/27/23 1934  Fri May 27, 2023  1610 Case d/w hospitalist. [PS]    Clinical Course User Index [PS] Jacquie Maudlin, MD     FINAL CLINICAL IMPRESSION(S) / ED DIAGNOSES   Final diagnoses:  Cellulitis of right lower extremity  Type 2 diabetes mellitus with diabetic polyneuropathy, with long-term current use of insulin  (HCC)     Rx / DC Orders   ED Discharge Orders     None        Note:  This document was prepared using Dragon voice recognition software and may include unintentional dictation errors.   Jacquie Maudlin, MD 05/27/23  2014

## 2023-05-27 NOTE — ED Notes (Signed)
Pharmacy tech at bedside completing med rec.

## 2023-05-27 NOTE — Assessment & Plan Note (Signed)
 Continue home propranolol  and ramipril 

## 2023-05-27 NOTE — Assessment & Plan Note (Addendum)
 Plantar wound right foot Rocephin and vancomycin  Pain control Keep leg elevated Podiatry consulted

## 2023-05-27 NOTE — ED Notes (Signed)
 Vallarie Gauze MD messaged via epic chat for orders for pt's bp

## 2023-05-27 NOTE — H&P (Signed)
 History and Physical    Patient: Jason [name] NWG:[id number] DOB: 01-22-1945 DOA: 05/27/2023 DOS: the patient was seen and examined on 05/27/2023 PCP: Lorrie Rothman, MD  Patient coming from: Home  Chief Complaint:  Chief Complaint  Patient presents with   Leg Pain    HPI: Jason [name] is a 78 y.o. male with medical history significant for CAD, CVA, DM, HLD, HTN, PAD s/p right TMA, left great toe amputation, stents left leg   sent in by podiatry for admission for treatment of cellulitis of right lower extremity.  Patient presented with 2-week history of redness extending from the stump up to mid calf associated with pain and swelling.  He has no fever or chills. He was also noted to have an additional area of swelling on the right lower leg that was aspirated in the office without any return.  ED course and data review: Elevated BP to 204/91 with otherwise normal vitals Labs with normal WBC, hemoglobin 11.7 and blood glucose 252 but otherwise unremarkable Foot x-ray shows no acute bony abnormality Patient started on cefepime  and vancomycin  Hospitalist consulted for admission.     Review of Systems: As mentioned in the history of present illness. All other systems reviewed and are negative.  Past Medical History:  Diagnosis Date   Arthritis    CAD (coronary artery disease)    Colon polyp    Diabetes mellitus without complication (HCC)    Hyperlipemia    Hypertension    Orchitis    PVD (peripheral vascular disease) (HCC)    Stroke (cerebrum) (HCC)    Tremor    Wears dentures    Past Surgical History:  Procedure Laterality Date   ACHILLES TENDON SURGERY Right 04/02/2020   Procedure: ACHILLES LENGTHENING;  Surgeon: Pink Bridges, DPM;  Location: [facility];  Service: Podiatry;  Laterality: Right;   AMPUTATION Right 08/21/2016   Procedure: RAY RESECTION-RIGHT 5TH RAY;  Surgeon: Angel Barba, DPM;  Location: [facility];  Service: Podiatry;  Laterality: Right;   AMPUTATION Left  11/26/2022   Procedure: PARTIAL 5TH RAY AMPUTATION;  Surgeon: Pink Bridges, DPM;  Location: [facility];  Service: Orthopedics/Podiatry;  Laterality: Left;   AMPUTATION TOE Left    big   BONE EXOSTOSIS EXCISION Right 06/25/2021   Procedure: EXOSTOSIS RESECTION;  Surgeon: Pink Bridges, DPM;  Location: [facility];  Service: Podiatry;  Laterality: Right;   CAROTID ENDARTERECTOMY Right    COLON SURGERY     CORONARY ARTERY BYPASS GRAFT     LOWER EXTREMITY ANGIOGRAPHY Right 03/31/2020   Procedure: Lower Extremity Angiography;  Surgeon: Celso College, MD;  Location: [facility];  Service: Cardiovascular;  Laterality: Right;   LOWER EXTREMITY ANGIOGRAPHY Left 11/25/2022   Procedure: Lower Extremity Angiography;  Surgeon: Celso College, MD;  Location: [facility];  Service: Cardiovascular;  Laterality: Left;   ROTATOR CUFF REPAIR Right    TRANSMETATARSAL AMPUTATION Right 04/02/2020   Procedure: TRANSMETATARSAL AMPUTATION;  Surgeon: Pink Bridges, DPM;  Location: [facility];  Service: Podiatry;  Laterality: Right;   Social History:  reports that he has never smoked. He has never used smokeless tobacco. He reports that he does not drink alcohol and does not use drugs.  No Known Allergies  Family History  Problem Relation Age of Onset   Diabetes Mother    Diabetes Father     Prior to Admission medications   Medication Sig Start Date End Date Taking? Authorizing Provider  aspirin  81 MG  tablet Take 81 mg by mouth daily.   Yes [provider]  calcium  carbonate (OSCAL) 1500 (600 Ca) MG TABS tablet Take 600 mg of elemental calcium  by mouth daily.   Yes [provider]  clopidogrel  (PLAVIX ) 75 MG tablet Take 75 mg by mouth daily.   Yes [provider]  Continuous Blood Gluc Receiver (FREESTYLE LIBRE 14 DAY READER) DEVI 1 Device. 05/15/19  Yes [provider]  Continuous Blood Gluc Sensor (FREESTYLE LIBRE 14 DAY SENSOR) MISC SMARTSIG:1 Kit(s)  Topical Every 2 Weeks 02/21/20  Yes [provider]  glimepiride  (AMARYL ) 4 MG tablet Take 4 mg by mouth daily with breakfast.   Yes [provider]  insulin  glargine (LANTUS ) 100 UNIT/ML injection Inject 25 Units into the skin at bedtime.   Yes [provider]  insulin  regular (NOVOLIN R,HUMULIN R ) 100 units/mL injection Inject 3-18 Units into the skin 3 (three) times daily before meals. Below 100=3u, 100-199=6u, 200-250=9u, 251-300=12u, 301-350=15u, 351 and above=18u.   Yes [provider]  Magnesium  250 MG TABS Take 1 tablet by mouth daily.   Yes [provider]  metFORMIN (GLUCOPHAGE) 1000 MG tablet Take 1,000 mg by mouth 2 (two) times daily with a meal.   Yes [provider]  Multiple Vitamin (MULTIVITAMIN) capsule Take 1 capsule by mouth daily.   Yes [provider]  Potassium 99 MG TABS Take 1 tablet by mouth daily.   Yes [provider]  propranolol  ER (INDERAL  LA) 60 MG 24 hr capsule Take 60 mg by mouth daily.   Yes [provider]  ramipril  (ALTACE ) 10 MG capsule Take 10 mg by mouth daily.   Yes [provider]  rosuvastatin  (CRESTOR ) 20 MG tablet Take 20 mg by mouth daily. 12/10/19 05/27/23 Yes [provider]  amLODipine  (NORVASC ) 10 MG tablet Take 1 tablet (10 mg total) by mouth daily. Patient not taking: Reported on 05/27/2023 11/28/22   Jason Salinas, MD  docusate sodium  (COLACE) 100 MG capsule Take 1 capsule (100 mg total) by mouth 2 (two) times daily as needed for mild constipation. Patient not taking: Reported on 05/27/2023 08/24/16   Vachhani, Vaibhavkumar, MD  mupirocin  ointment (BACTROBAN ) 2 % Place 1 Application into the nose 2 (two) times daily. Patient not taking: Reported on 05/27/2023 11/27/22   Jason Salinas, MD  povidone-iodine  (BETADINE ) 10 % external solution To apply and clean the wound 3 times a week. Patient not taking: Reported on 05/27/2023 08/24/16   Vachhani, Vaibhavkumar,  MD  traMADol  (ULTRAM ) 50 MG tablet Take 1 tablet (50 mg total) by mouth every 6 (six) hours as needed. Patient not taking: Reported on 05/27/2023 11/27/22 11/27/23  Jason Salinas, MD    Physical Exam: Vitals:   05/27/23 1531 05/27/23 1532 05/27/23 1926 05/27/23 1931  BP:  (!) 156/65 (!) 204/91 (!) 190/75  Pulse:  70 64 63  Resp:  18 18   Temp:  98.4 F (36.9 C) 98.5 F (36.9 C)   TempSrc:   Oral   SpO2:  97% 100% 100%  Weight: 97.5 kg     Height: 5\' 10"  (1.778 m)      Physical Exam Vitals and nursing note reviewed.  Constitutional:      General: He is not in acute distress. HENT:     Head: Normocephalic and atraumatic.  Cardiovascular:     Rate and Rhythm: Normal rate and regular rhythm.     Heart sounds: Normal heart sounds.  Pulmonary:  Effort: Pulmonary effort is normal.     Breath sounds: Normal breath sounds.  Abdominal:     Palpations: Abdomen is soft.     Tenderness: There is no abdominal tenderness.  Musculoskeletal:     Comments: See pictures below Superficial ulcer lateral leg with some fluctuance beneath it  Neurological:     Mental Status: Mental status is at baseline.          Labs on Admission: I have personally reviewed following labs and imaging studies  CBC: Recent Labs  Lab 05/27/23 1529  WBC 6.8  HGB 11.7*  HCT 35.4*  MCV 87.2  PLT 260   Basic Metabolic Panel: Recent Labs  Lab 05/27/23 1529  NA 136  K 3.8  CL 101  CO2 25  GLUCOSE 252*  BUN 20  CREATININE 1.08  CALCIUM  8.9   GFR: Estimated Creatinine Clearance: 67.1 mL/min (by C-G formula based on SCr of 1.08 mg/dL). Liver Function Tests: No results for input(s): "AST", "ALT", "ALKPHOS", "BILITOT", "PROT", "ALBUMIN" in the last 168 hours. No results for input(s): "LIPASE", "AMYLASE" in the last 168 hours. No results for input(s): "AMMONIA" in the last 168 hours. Coagulation Profile: No results for input(s): "INR", "PROTIME" in the last 168 hours. Cardiac Enzymes: No  results for input(s): "CKTOTAL", "CKMB", "CKMBINDEX", "TROPONINI" in the last 168 hours. BNP (last 3 results) No results for input(s): "PROBNP" in the last 8760 hours. HbA1C: No results for input(s): "HGBA1C" in the last 72 hours. CBG: No results for input(s): "GLUCAP" in the last 168 hours. Lipid Profile: No results for input(s): "CHOL", "HDL", "LDLCALC", "TRIG", "CHOLHDL", "LDLDIRECT" in the last 72 hours. Thyroid  Function Tests: No results for input(s): "TSH", "T4TOTAL", "FREET4", "T3FREE", "THYROIDAB" in the last 72 hours. Anemia Panel: No results for input(s): "VITAMINB12", "FOLATE", "FERRITIN", "TIBC", "IRON", "RETICCTPCT" in the last 72 hours. Urine analysis:    Component Value Date/Time   COLORURINE YELLOW (A) 11/24/2022 2355   APPEARANCEUR CLEAR (A) 11/24/2022 2355   LABSPEC 1.020 11/24/2022 2355   PHURINE 6.0 11/24/2022 2355   GLUCOSEU 150 (A) 11/24/2022 2355   HGBUR NEGATIVE 11/24/2022 2355   BILIRUBINUR NEGATIVE 11/24/2022 2355   KETONESUR NEGATIVE 11/24/2022 2355   PROTEINUR 30 (A) 11/24/2022 2355   NITRITE NEGATIVE 11/24/2022 2355   LEUKOCYTESUR NEGATIVE 11/24/2022 2355    Radiological Exams on Admission: DG Foot 2 Views Right Result Date: 05/27/2023 CLINICAL DATA:  Right foot swelling, redness EXAM: RIGHT FOOT - 2 VIEW COMPARISON:  04/02/2020 FINDINGS: Prior transmetatarsal amputation of all the toes in the right foot. Advanced degenerative changes throughout the midfoot and hindfoot. Plantar calcaneal spur. No bone destruction to suggest osteomyelitis. No fracture. There appears to be mild diffuse soft tissue swelling. No soft tissue gas or radiopaque foreign body. IMPRESSION: No acute bony abnormality. Electronically Signed   By: Janeece Mechanic M.D.   On: 05/27/2023 19:40   Data Reviewed for HPI: Relevant notes from primary care and specialist visits, past discharge summaries as available in EHR, including Care Everywhere. Prior diagnostic testing as pertinent to  current admission diagnoses Updated medications and problem lists for reconciliation ED course, including vitals, labs, imaging, treatment and response to treatment Triage notes, nursing and pharmacy notes and ED provider's notes Notable results as noted above in HPI      Assessment and Plan: * Cellulitis of right lower extremity Plantar wound right foot Rocephin and vancomycin  Pain control Keep leg elevated Podiatry consulted  Uncontrolled type 2 diabetes mellitus with hyperglycemia, with long-term  current use of insulin  (HCC) Blood glucose 252 Continue basal insulin  Sliding scale coverage  PVD (peripheral vascular disease) (HCC) History of right TMA, left great toe amputation and stent angioplasty left leg Continue aspirin  and rosuvastatin   Essential hypertension Continue home propranolol  and ramipril   History of CVA (cerebrovascular accident) Continue aspirin  and rosuvastatin   CAD (coronary artery disease) Continue aspirin  and rosuvastatin    DVT prophylaxis: Lovenox   Consults: podiatry  Advance Care Planning:   Code Status: Full Code   Family Communication: none  Disposition Plan: Back to previous home environment  Severity of Illness: The appropriate patient status for this patient is OBSERVATION. Observation status is judged to be reasonable and necessary in order to provide the required intensity of service to ensure the patient's safety. The patient's presenting symptoms, physical exam findings, and initial radiographic and laboratory data in the context of their medical condition is felt to place them at decreased risk for further clinical deterioration. Furthermore, it is anticipated that the patient will be medically stable for discharge from the hospital within 2 midnights of admission.   Author: Lanetta Pion, MD 05/27/2023 9:33 PM  For on call review [email or web address].

## 2023-05-27 NOTE — Assessment & Plan Note (Signed)
 Blood glucose 252 Continue basal insulin  Sliding scale coverage

## 2023-05-27 NOTE — Assessment & Plan Note (Addendum)
 History of right TMA, left great toe amputation and stent angioplasty left leg Continue aspirin  and rosuvastatin 

## 2023-05-27 NOTE — Consult Note (Signed)
 Pharmacy Antibiotic Note  Jason [name] is a 78 y.o. male admitted on 05/27/2023 with cellulitis.  Pharmacy has been consulted for vancomycin  dosing.  Plan: Give vancomycin  2000 mg IV x 1, followed by 1750 mg IV every 24 hours Estimated AUC 466, Cmin 10.6 IBW, Scr 1.08, Vd 0.72 Vancomycin  levels at steady state or as clinically indicated Ceftriaxone 1 gram IV every 24 hours per provider Follow renal function and cultures for adjustments  Height: 5\' 10"  (177.8 cm) Weight: 97.5 kg (215 lb) IBW/kg (Calculated) : 73  Temp (24hrs), Avg:98.5 F (36.9 C), Min:98.4 F (36.9 C), Max:98.5 F (36.9 C)  Recent Labs  Lab 05/27/23 1529 05/27/23 1530 05/27/23 1851  WBC 6.8  --   --   CREATININE 1.08  --   --   LATICACIDVEN  --  1.7 1.2    Estimated Creatinine Clearance: 67.1 mL/min (by C-G formula based on SCr of 1.08 mg/dL).    No Known Allergies  Antimicrobials this admission: cefepime  5/16 x 1 ceftriaxone 5/17 >>  Vancomycin  5/16>>  Microbiology results: 5/16 BCx: pending  Thank you for allowing pharmacy to be a part of this patient's care.  Ramonita Burow, PharmD 05/27/2023 7:44 PM

## 2023-05-28 ENCOUNTER — Observation Stay

## 2023-05-28 DIAGNOSIS — L03115 Cellulitis of right lower limb: Secondary | ICD-10-CM | POA: Diagnosis not present

## 2023-05-28 DIAGNOSIS — L0390 Cellulitis, unspecified: Secondary | ICD-10-CM | POA: Diagnosis not present

## 2023-05-28 LAB — BASIC METABOLIC PANEL WITH GFR
Anion gap: 8 (ref 5–15)
BUN: 15 mg/dL (ref 8–23)
CO2: 24 mmol/L (ref 22–32)
Calcium: 8.6 mg/dL — ABNORMAL LOW (ref 8.9–10.3)
Chloride: 105 mmol/L (ref 98–111)
Creatinine, Ser: 0.79 mg/dL (ref 0.61–1.24)
GFR, Estimated: >60 mL/min (ref >60)
Glucose, Bld: 201 mg/dL — ABNORMAL HIGH (ref 70–99)
Potassium: 3.5 mmol/L (ref 3.5–5.1)
Sodium: 137 mmol/L (ref 135–145)

## 2023-05-28 LAB — GLUCOSE, CAPILLARY
Glucose-Capillary: 161 mg/dL — ABNORMAL HIGH (ref 70–99)
Glucose-Capillary: 245 mg/dL — ABNORMAL HIGH (ref 70–99)
Glucose-Capillary: 217 mg/dL — ABNORMAL HIGH (ref 70–99)
Glucose-Capillary: 224 mg/dL — ABNORMAL HIGH (ref 70–99)

## 2023-05-28 LAB — CBC
HCT: 32.8 % — ABNORMAL LOW (ref 39.0–52.0)
Hemoglobin: 11.1 g/dL — ABNORMAL LOW (ref 13.0–17.0)
MCH: 29.1 pg (ref 26.0–34.0)
MCHC: 33.8 g/dL (ref 30.0–36.0)
MCV: 85.9 fL (ref 80.0–100.0)
Platelets: 225 10*3/uL (ref 150–400)
RBC: 3.82 MIL/uL — ABNORMAL LOW (ref 4.22–5.81)
RDW: 11.9 % (ref 11.5–15.5)
WBC: 5.6 10*3/uL (ref 4.0–10.5)
nRBC: 0 % (ref 0.0–0.2)

## 2023-05-28 LAB — HEMOGLOBIN A1C
Hgb A1c MFr Bld: 9.7 % — ABNORMAL HIGH (ref 4.8–5.6)
Mean Plasma Glucose: 231.69 mg/dL

## 2023-05-28 MED ORDER — HYDRALAZINE HCL 20 MG/ML IJ SOLN
10.0000 mg | INTRAMUSCULAR | Status: DC | PRN
  Administered 2023-05-28 – 2023-05-29 (×2): 10 mg via INTRAVENOUS
  Filled 2023-05-28: qty 1

## 2023-05-28 MED ORDER — HYDROCODONE-ACETAMINOPHEN 5-325 MG PO TABS
1.0000 | ORAL_TABLET | ORAL | Status: DC | PRN
  Administered 2023-05-30: 1 via ORAL
  Filled 2023-05-28: qty 1

## 2023-05-28 MED ORDER — VANCOMYCIN HCL IN DEXTROSE 1-5 GM/200ML-% IV SOLN
1000.0000 mg | Freq: Two times a day (BID) | INTRAVENOUS | Status: DC
  Administered 2023-05-28 (×2): 1000 mg via INTRAVENOUS
  Filled 2023-05-28 (×3): qty 200

## 2023-05-28 NOTE — Consult Note (Signed)
 ORTHOPAEDIC CONSULTATION  REQUESTING PHYSICIAN: Melvinia Stager, MD  Chief Complaint: Right lower leg cellulitis  HPI: Jason [name] is a 78 y.o. male who complains of worsening redness and cellulitis to his right lower leg.  Seen in my office yesterday.  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  Upon evaluation yesterday severe erythema to the entire right lower leg.  1 small area of possible fluctuance at the distal lateral fibula was noted.  Aspiration at that time on the revealed a bloody tap.  Given the severe erythema and history of diabetes with previous transmetatarsal amputation I recommended evaluation through the hospital for possible further workup.  Patient sitting in bed resting comfortably today.  No complaints.  No fever or chills.  Past Medical History:  Diagnosis Date   Arthritis    CAD (coronary artery disease)    Colon polyp    Diabetes mellitus without complication (HCC)    Hyperlipemia    Hypertension    Orchitis    PVD (peripheral vascular disease) (HCC)    Stroke (cerebrum) (HCC)    Tremor    Wears dentures    Past Surgical History:  Procedure Laterality Date   ACHILLES TENDON SURGERY Right 04/02/2020   Procedure: ACHILLES LENGTHENING;  Surgeon: Pink Bridges, DPM;  Location: [facility];  Service: Podiatry;  Laterality: Right;   AMPUTATION Right 08/21/2016   Procedure: RAY RESECTION-RIGHT 5TH RAY;  Surgeon: Angel Barba, DPM;  Location: [facility];  Service: Podiatry;  Laterality: Right;   AMPUTATION Left 11/26/2022   Procedure: PARTIAL 5TH RAY AMPUTATION;  Surgeon: Pink Bridges, DPM;  Location: [facility];  Service: Orthopedics/Podiatry;  Laterality: Left;   AMPUTATION TOE Left    big   BONE EXOSTOSIS EXCISION Right 06/25/2021   Procedure: EXOSTOSIS RESECTION;  Surgeon: Pink Bridges, DPM;  Location: [facility];  Service: Podiatry;  Laterality: Right;   CAROTID ENDARTERECTOMY Right    COLON SURGERY     CORONARY ARTERY  BYPASS GRAFT     LOWER EXTREMITY ANGIOGRAPHY Right 03/31/2020   Procedure: Lower Extremity Angiography;  Surgeon: Celso College, MD;  Location: [facility];  Service: Cardiovascular;  Laterality: Right;   LOWER EXTREMITY ANGIOGRAPHY Left 11/25/2022   Procedure: Lower Extremity Angiography;  Surgeon: Celso College, MD;  Location: [facility];  Service: Cardiovascular;  Laterality: Left;   ROTATOR CUFF REPAIR Right    TRANSMETATARSAL AMPUTATION Right 04/02/2020   Procedure: TRANSMETATARSAL AMPUTATION;  Surgeon: Pink Bridges, DPM;  Location: [facility];  Service: Podiatry;  Laterality: Right;   Social History   Socioeconomic History   Marital status: Widowed    Spouse name: Not on file   Number of children: Not on file   Years of education: Not on file   Highest education level: Not on file  Occupational History   Occupation: sales associate  Tobacco Use   Smoking status: Never   Smokeless tobacco: Never  Substance and Sexual Activity   Alcohol use: No   Drug use: No   Sexual activity: Not on file  Other Topics Concern   Not on file  Social History Narrative   Not on file   Social Drivers of Health   Financial Resource Strain: Medium Risk (12/08/2022)   Received from [facility]   Overall Financial Resource Strain (CARDIA)    Difficulty of Paying Living Expenses: Somewhat hard  Food Insecurity: No Food Insecurity (05/27/2023)   Hunger Vital Sign    Worried  About Running Out of Food in the Last Year: Never true    Ran Out of Food in the Last Year: Never true  Transportation Needs: No Transportation Needs (05/27/2023)   PRAPARE - Administrator, Civil Service (Medical): No    Lack of Transportation (Non-Medical): No  Physical Activity: Not on file  Stress: Not on file  Social Connections: Moderately Isolated (05/27/2023)   Social Connection and Isolation Panel [NHANES]    Frequency of Communication with Friends and Family: More than  three times a week    Frequency of Social Gatherings with Friends and Family: More than three times a week    Attends Religious Services: More than 4 times per year    Active Member of [employer] or Organizations: No    Attends Banker Meetings: Never    Marital Status: Widowed   Family History  Problem Relation Age of Onset   Diabetes Mother    Diabetes Father    No Known Allergies Prior to Admission medications   Medication Sig Start Date End Date Taking? Authorizing Provider  aspirin  81 MG tablet Take 81 mg by mouth daily.   Yes [provider]  calcium  carbonate (OSCAL) 1500 (600 Ca) MG TABS tablet Take 600 mg of elemental calcium  by mouth daily.   Yes [provider]  clopidogrel  (PLAVIX ) 75 MG tablet Take 75 mg by mouth daily.   Yes [provider]  Continuous Blood Gluc Receiver (FREESTYLE LIBRE 14 DAY READER) DEVI 1 Device. 05/15/19  Yes [provider]  Continuous Blood Gluc Sensor (FREESTYLE LIBRE 14 DAY SENSOR) MISC SMARTSIG:1 Kit(s) Topical Every 2 Weeks 02/21/20  Yes [provider]  glimepiride  (AMARYL ) 4 MG tablet Take 4 mg by mouth daily with breakfast.   Yes [provider]  insulin  glargine (LANTUS ) 100 UNIT/ML injection Inject 25 Units into the skin at bedtime.   Yes [provider]  insulin  regular (NOVOLIN R,HUMULIN R ) 100 units/mL injection Inject 3-18 Units into the skin 3 (three) times daily before meals. Below 100=3u, 100-199=6u, 200-250=9u, 251-300=12u, 301-350=15u, 351 and above=18u.   Yes [provider]  Magnesium  250 MG TABS Take 1 tablet by mouth daily.   Yes [provider]  metFORMIN (GLUCOPHAGE) 1000 MG tablet Take 1,000 mg by mouth 2 (two) times daily with a meal.   Yes [provider]  Multiple Vitamin (MULTIVITAMIN) capsule Take 1 capsule by mouth daily.   Yes [provider]  Potassium 99 MG TABS Take 1 tablet by mouth daily.   Yes [provider]  propranolol  ER (INDERAL  LA) 60 MG 24 hr capsule Take 60 mg by mouth daily.   Yes [provider]  ramipril  (ALTACE ) 10 MG capsule Take 10 mg by mouth daily.   Yes [provider]  rosuvastatin  (CRESTOR ) 20 MG tablet Take 20 mg by mouth daily. 12/10/19 05/27/23 Yes [provider]  amLODipine  (NORVASC ) 10 MG tablet Take 1 tablet (10 mg total) by mouth daily. Patient not taking: Reported on 05/27/2023 11/28/22   Luna Salinas, MD  docusate sodium  (COLACE) 100 MG capsule Take 1 capsule (100 mg total) by mouth 2 (two) times daily as needed for mild constipation. Patient not taking: Reported on 05/27/2023 08/24/16   Vachhani, Vaibhavkumar, MD  mupirocin  ointment (BACTROBAN ) 2 % Place 1 Application into the nose 2 (two) times daily. Patient not taking: Reported on 05/27/2023 11/27/22   Luna Salinas, MD  povidone-iodine  (BETADINE ) 10 % external solution To  apply and clean the wound 3 times a week. Patient not taking: Reported on 05/27/2023 08/24/16   Vachhani, Vaibhavkumar, MD  traMADol  (ULTRAM ) 50 MG tablet Take 1 tablet (50 mg total) by mouth every 6 (six) hours as needed. Patient not taking: Reported on 05/27/2023 11/27/22 11/27/23  Luna Salinas, MD   DG Foot 2 Views Right Result Date: 05/27/2023 CLINICAL DATA:  Right foot swelling, redness EXAM: RIGHT FOOT - 2 VIEW COMPARISON:  04/02/2020 FINDINGS: Prior transmetatarsal amputation of all the toes in the right foot. Advanced degenerative changes throughout the midfoot and hindfoot. Plantar calcaneal spur. No bone destruction to suggest osteomyelitis. No fracture. There appears to be mild diffuse soft tissue swelling. No soft tissue gas or radiopaque foreign body. IMPRESSION: No acute bony abnormality. Electronically Signed   By: Janeece Mechanic M.D.   On: 05/27/2023 19:40    Positive ROS: All other systems have been reviewed and were otherwise negative with the exception of those mentioned in the HPI and as above.  12  point ROS was performed.  Physical Exam: General: Alert and oriented.  No apparent distress.  Vascular:  Left foot:Dorsalis Pedis:  present Posterior Tibial:  present  Right foot: Dorsalis Pedis:  present Posterior Tibial:  present  Neuro:absent protective sensation  Derm: Patient has a chronic ulceration to the plantar right forefoot.  This is stable at this time.  I was able to debride some of the surrounding soft tissue in the outpatient clinic yesterday.  Evaluation of the right lower leg today reveals dramatic improvement of diffuse erythema and cellulitis.  1 small area of darkened discoloration of the skin to the distal lateral aspect at the site of the possible fluctuant area.  The fluctuance is much less at this time.  Ortho/MS: Status post transmetatarsal amputation.  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  He is dramatically improved from yesterday.  The erythema is markedly improved.  Small focal area of ecchymosis to the distal lateral fibula.  As long as x-rays are normal and his white blood cell count has remained normal with no signs of concern for sepsis I believe he would be stable for discharge on oral antibiotics.  We can continue to follow in the outpatient clinic.  Continue with local wound care to the plantar right foot.  He has been performing this at home.      Brant Caldron, DPM Cell [phone]   05/28/2023 11:09 AM

## 2023-05-28 NOTE — Plan of Care (Signed)
  Problem: Education: Goal: Ability to describe self-care measures that may prevent or decrease complications (Diabetes Survival Skills Education) will improve Outcome: Progressing Goal: Individualized Educational Video(s) Outcome: Progressing   Problem: Skin Integrity: Goal: Risk for impaired skin integrity will decrease Outcome: Progressing   Problem: Activity: Goal: Risk for activity intolerance will decrease Outcome: Progressing   Problem: Nutrition: Goal: Adequate nutrition will be maintained Outcome: Progressing   Problem: Safety: Goal: Ability to remain free from injury will improve Outcome: Progressing

## 2023-05-28 NOTE — Consult Note (Signed)
 Pharmacy Antibiotic Note  Jason [name] is a 78 y.o. male admitted on 05/27/2023 with cellulitis.  Pharmacy has been consulted for vancomycin  dosing.  Plan: Vancomycin  1 g IV q12h Estimated AUC 403, Cmin 11.5 IBW, Scr 0.8, Vd 0.72 Vancomycin  levels at steady state or as clinically indicated Ceftriaxone  1 gram IV every 24 hours per provider Follow renal function and cultures for adjustments  Height: 5\' 10"  (177.8 cm) Weight: 97.5 kg (215 lb) IBW/kg (Calculated) : 73  Temp (24hrs), Avg:98.1 F (36.7 C), Min:97.7 F (36.5 C), Max:98.5 F (36.9 C)  Recent Labs  Lab 05/27/23 1529 05/27/23 1530 05/27/23 1851 05/28/23 0401  WBC 6.8  --   --  5.6  CREATININE 1.08  --   --  0.79  LATICACIDVEN  --  1.7 1.2  --     Estimated Creatinine Clearance: 90.6 mL/min (by C-G formula based on SCr of 0.79 mg/dL).    No Known Allergies  Antimicrobials this admission: cefepime  5/16 x 1 ceftriaxone  5/17 >>  vancomycin  5/16 >>  Microbiology results: 5/16 BCx: NGTD  Thank you for allowing pharmacy to be a part of this patient's care.  Page Boast 05/28/2023 7:31 AM

## 2023-05-28 NOTE — Care Management Obs Status (Signed)
 MEDICARE OBSERVATION STATUS NOTIFICATION   Patient Details  Name: MANJINDER BREAU MRN: [id number] Date of Birth: Aug 27, 1945   Medicare Observation Status Notification Given:  Yes    [place] L Darrly Loberg, LCSW 05/28/2023, 9:51 AM

## 2023-05-28 NOTE — Progress Notes (Signed)
 [facility]  - [place] at [facility]   PATIENT NAME: Jason [name]    MR#:  [id number]  DATE OF BIRTH:  Jul 03, 1945  SUBJECTIVE:      VITALS:  Blood pressure (!) 152/80, pulse 65, temperature 97.9 F (36.6 C), temperature source Oral, resp. rate 16, height 5\' 10"  (1.778 m), weight 97.5 kg, SpO2 99%.  PHYSICAL EXAMINATION:   GENERAL:  78 y.o.-year-old patient with no acute distress.  LUNGS: Normal breath sounds bilaterally, no wheezing CARDIOVASCULAR: S1, S2 normal. No murmur   ABDOMEN: Soft, nontender, nondistended. Bowel sounds present.  EXTREMITIES: No  edema b/l.    NEUROLOGIC: nonfocal  patient is alert and awake SKIN: No obvious rash, lesion, or ulcer.   LABORATORY PANEL:  CBC Recent Labs  Lab 05/28/23 0401  WBC 5.6  HGB 11.1*  HCT 32.8*  PLT 225    Chemistries  Recent Labs  Lab 05/28/23 0401  NA 137  K 3.5  CL 105  CO2 24  GLUCOSE 201*  BUN 15  CREATININE 0.79  CALCIUM  8.6*   Cardiac Enzymes No results for input(s): "TROPONINI" in the last 168 hours. RADIOLOGY:  DG Tibia/Fibula Right Result Date: 05/28/2023 CLINICAL DATA:  Cellulitis. EXAM: RIGHT TIBIA AND FIBULA - 2 VIEW COMPARISON:  None Available. FINDINGS: There is no evidence of fracture or other focal bone lesions. Soft tissues are unremarkable. IMPRESSION: Negative. Electronically Signed   By: Rosalene Colon M.D.   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 Advanced degenerative changes throughout the midfoot and hindfoot. Plantar calcaneal spur. No bone destruction to suggest osteomyelitis. No fracture. There appears to be mild diffuse soft tissue swelling. No soft tissue gas or radiopaque foreign body. IMPRESSION: No acute bony abnormality. Electronically Signed   By: Janeece Mechanic  M.D.   On: 05/27/2023 19:40    Assessment and Plan  Jason [name] is a 78 y.o. male with medical history significant for CAD, CVA, DM, HLD, HTN, PAD s/p right TMA, left great toe amputation, stents left leg   sent in by podiatry for admission for treatment of cellulitis of right lower extremity.  Patient presented with 2-week history of redness extending from the stump up to mid calf associated with pain and swelling.  He has no fever or chills. He was also noted to have an additional area of swelling on the right lower leg that was aspirated in the Podiatry office without any return.   Xray Tib/Fib and Foot x-ray shows no acute bony abnormality   Cellulitis of right lower extremity Plantar wound right foot --Rocephin  and vancomycin --change to oral abxs in am. Seen by podiatry--overall improving with IV abxs. Cont IV abxs for one more day --prn Pain control --Keep leg elevated --Podiatry consult noted by Dr Althea Atkinson -- blood culture negative   Uncontrolled type 2 diabetes mellitus with hyperglycemia, with long-term current use of insulin  (HCC) --Continue basal insulin  --Sliding scale coverage   PVD (peripheral vascular disease) (HCC) History of right TMA, left great toe amputation and stent angioplasty left leg --Continue aspirin  and rosuvastatin    Essential hypertension --Continue home propranolol  and ramipril    History of CVA (cerebrovascular accident) --Continue aspirin  and rosuvastatin    CAD (coronary artery disease) --Continue aspirin  and rosuvastatin   If remains stable will discharge on oral antibiotics  tomorrow.    Procedures:none Family communication :none Consults :Podiatry CODE STATUS: full DVT Prophylaxis :lovenox  Level of care: Med-Surg Status is: Observation The patient remains OBS appropriate and will d/c before 2 midnights.    TOTAL TIME TAKING CARE OF THIS PATIENT: 35 minutes.  >50% time spent on counselling and coordination of care  Note: This  dictation was prepared with Dragon dictation along with smaller phrase technology. Any transcriptional errors that result from this process are unintentional.  Melvinia Stager M.D    [facility]   CC: Primary care physician; Lorrie Rothman, MD

## 2023-05-29 ENCOUNTER — Inpatient Hospital Stay: Admitting: Anesthesiology

## 2023-05-29 ENCOUNTER — Observation Stay

## 2023-05-29 ENCOUNTER — Encounter
Admission: EM | Disposition: A | Discharge status: Home Health | Payer: Self-pay | Source: Home / Self Care | Attending: Internal Medicine

## 2023-05-29 ENCOUNTER — Other Ambulatory Visit: Payer: Self-pay

## 2023-05-29 DIAGNOSIS — E11628 Type 2 diabetes mellitus with other skin complications: Secondary | ICD-10-CM | POA: Diagnosis present

## 2023-05-29 DIAGNOSIS — R600 Localized edema: Secondary | ICD-10-CM | POA: Diagnosis not present

## 2023-05-29 DIAGNOSIS — Z79899 Other long term (current) drug therapy: Secondary | ICD-10-CM | POA: Diagnosis not present

## 2023-05-29 DIAGNOSIS — Z7902 Long term (current) use of antithrombotics/antiplatelets: Secondary | ICD-10-CM | POA: Diagnosis not present

## 2023-05-29 DIAGNOSIS — L03115 Cellulitis of right lower limb: Secondary | ICD-10-CM | POA: Diagnosis present

## 2023-05-29 DIAGNOSIS — Z833 Family history of diabetes mellitus: Secondary | ICD-10-CM | POA: Diagnosis not present

## 2023-05-29 DIAGNOSIS — Z5986 Financial insecurity: Secondary | ICD-10-CM | POA: Diagnosis not present

## 2023-05-29 DIAGNOSIS — Z7984 Long term (current) use of oral hypoglycemic drugs: Secondary | ICD-10-CM | POA: Diagnosis not present

## 2023-05-29 DIAGNOSIS — Z8673 Personal history of transient ischemic attack (TIA), and cerebral infarction without residual deficits: Secondary | ICD-10-CM | POA: Diagnosis not present

## 2023-05-29 DIAGNOSIS — Z95820 Peripheral vascular angioplasty status with implants and grafts: Secondary | ICD-10-CM | POA: Diagnosis not present

## 2023-05-29 DIAGNOSIS — Z89411 Acquired absence of right great toe: Secondary | ICD-10-CM | POA: Diagnosis not present

## 2023-05-29 DIAGNOSIS — I2510 Atherosclerotic heart disease of native coronary artery without angina pectoris: Secondary | ICD-10-CM | POA: Diagnosis not present

## 2023-05-29 DIAGNOSIS — I10 Essential (primary) hypertension: Secondary | ICD-10-CM | POA: Diagnosis not present

## 2023-05-29 DIAGNOSIS — L97519 Non-pressure chronic ulcer of other part of right foot with unspecified severity: Secondary | ICD-10-CM | POA: Diagnosis present

## 2023-05-29 DIAGNOSIS — E119 Type 2 diabetes mellitus without complications: Secondary | ICD-10-CM | POA: Diagnosis not present

## 2023-05-29 DIAGNOSIS — E1151 Type 2 diabetes mellitus with diabetic peripheral angiopathy without gangrene: Secondary | ICD-10-CM | POA: Diagnosis present

## 2023-05-29 DIAGNOSIS — Z89412 Acquired absence of left great toe: Secondary | ICD-10-CM | POA: Diagnosis not present

## 2023-05-29 DIAGNOSIS — Z794 Long term (current) use of insulin: Secondary | ICD-10-CM | POA: Diagnosis not present

## 2023-05-29 DIAGNOSIS — E11621 Type 2 diabetes mellitus with foot ulcer: Secondary | ICD-10-CM | POA: Diagnosis present

## 2023-05-29 DIAGNOSIS — Z7982 Long term (current) use of aspirin: Secondary | ICD-10-CM | POA: Diagnosis not present

## 2023-05-29 DIAGNOSIS — Z951 Presence of aortocoronary bypass graft: Secondary | ICD-10-CM | POA: Diagnosis not present

## 2023-05-29 DIAGNOSIS — E785 Hyperlipidemia, unspecified: Secondary | ICD-10-CM | POA: Diagnosis present

## 2023-05-29 DIAGNOSIS — M7989 Other specified soft tissue disorders: Secondary | ICD-10-CM | POA: Diagnosis not present

## 2023-05-29 DIAGNOSIS — Z860100 Personal history of colon polyps, unspecified: Secondary | ICD-10-CM | POA: Diagnosis not present

## 2023-05-29 DIAGNOSIS — L02415 Cutaneous abscess of right lower limb: Secondary | ICD-10-CM | POA: Diagnosis not present

## 2023-05-29 DIAGNOSIS — E1142 Type 2 diabetes mellitus with diabetic polyneuropathy: Secondary | ICD-10-CM | POA: Diagnosis present

## 2023-05-29 DIAGNOSIS — E1165 Type 2 diabetes mellitus with hyperglycemia: Secondary | ICD-10-CM | POA: Diagnosis present

## 2023-05-29 HISTORY — PX: IRRIGATION AND DEBRIDEMENT ABSCESS: SHX5252

## 2023-05-29 LAB — GLUCOSE, CAPILLARY
Glucose-Capillary: 170 mg/dL — ABNORMAL HIGH (ref 70–99)
Glucose-Capillary: 280 mg/dL — ABNORMAL HIGH (ref 70–99)
Glucose-Capillary: 102 mg/dL — ABNORMAL HIGH (ref 70–99)
Glucose-Capillary: 224 mg/dL — ABNORMAL HIGH (ref 70–99)

## 2023-05-29 SURGERY — IRRIGATION AND DEBRIDEMENT ABSCESS
Anesthesia: General | Site: Leg Upper | Laterality: Right

## 2023-05-29 MED ORDER — METFORMIN HCL 500 MG PO TABS
1000.0000 mg | ORAL_TABLET | Freq: Two times a day (BID) | ORAL | Status: DC
  Administered 2023-05-29 – 2023-05-31 (×5): 1000 mg via ORAL
  Filled 2023-05-29 (×5): qty 2

## 2023-05-29 MED ORDER — EPHEDRINE SULFATE-NACL 50-0.9 MG/10ML-% IV SOSY
PREFILLED_SYRINGE | INTRAVENOUS | Status: DC | PRN
Start: 2023-05-29 — End: 2023-05-29
  Administered 2023-05-29: 5 mg via INTRAVENOUS

## 2023-05-29 MED ORDER — VANCOMYCIN HCL 1.25 G IV SOLR: 1250.0000 mg | INTRAVENOUS | Status: DC

## 2023-05-29 MED ORDER — LIDOCAINE HCL (CARDIAC) PF 100 MG/5ML IV SOSY
PREFILLED_SYRINGE | INTRAVENOUS | Status: DC | PRN
  Administered 2023-05-29: 80 mg via INTRATRACHEAL

## 2023-05-29 MED ORDER — MAGNESIUM 250 MG PO TABS: 1.0000 | ORAL_TABLET | Freq: Every day | ORAL | Status: DC

## 2023-05-29 MED ORDER — SODIUM CHLORIDE 0.9 % IV SOLN: INTRAVENOUS | Status: DC | PRN

## 2023-05-29 MED ORDER — PROPOFOL 10 MG/ML IV BOLUS
INTRAVENOUS | Status: AC
  Filled 2023-05-29: qty 20

## 2023-05-29 MED ORDER — 0.9 % SODIUM CHLORIDE (POUR BTL) OPTIME
TOPICAL | Status: DC | PRN
  Administered 2023-05-29: 1000 mL

## 2023-05-29 MED ORDER — LIDOCAINE HCL (PF) 1 % IJ SOLN
INTRAMUSCULAR | Status: AC
  Filled 2023-05-29: qty 30

## 2023-05-29 MED ORDER — FENTANYL CITRATE (PF) 100 MCG/2ML IJ SOLN
INTRAMUSCULAR | Status: DC | PRN
  Administered 2023-05-29 (×2): 50 ug via INTRAVENOUS

## 2023-05-29 MED ORDER — INSULIN GLARGINE-YFGN 100 UNIT/ML ~~LOC~~ SOLN
20.0000 [IU] | Freq: Every day | SUBCUTANEOUS | Status: DC
  Administered 2023-05-29 – 2023-05-30 (×2): 20 [IU] via SUBCUTANEOUS
  Filled 2023-05-29 (×3): qty 0.2

## 2023-05-29 MED ORDER — FENTANYL CITRATE (PF) 100 MCG/2ML IJ SOLN
INTRAMUSCULAR | Status: AC
  Filled 2023-05-29: qty 2

## 2023-05-29 MED ORDER — CHLORHEXIDINE GLUCONATE 4 % EX SOLN
60.0000 mL | Freq: Once | CUTANEOUS | Status: DC
  Administered 2023-05-29: 4 via TOPICAL

## 2023-05-29 MED ORDER — GLIMEPIRIDE 4 MG PO TABS
4.0000 mg | ORAL_TABLET | Freq: Every day | ORAL | Status: DC
  Administered 2023-05-30 – 2023-05-31 (×2): 4 mg via ORAL
  Filled 2023-05-29 (×3): qty 1

## 2023-05-29 MED ORDER — CALCIUM CARBONATE 1250 (500 CA) MG PO TABS
500.0000 mg | ORAL_TABLET | Freq: Every day | ORAL | Status: DC
  Administered 2023-05-30 – 2023-05-31 (×2): 1250 mg via ORAL
  Filled 2023-05-29 (×3): qty 1

## 2023-05-29 MED ORDER — RAMIPRIL 10 MG PO CAPS: 10.0000 mg | ORAL_CAPSULE | Freq: Every day | ORAL | Status: DC

## 2023-05-29 MED ORDER — MAGNESIUM OXIDE -MG SUPPLEMENT 400 (240 MG) MG PO TABS
200.0000 mg | ORAL_TABLET | Freq: Every day | ORAL | Status: DC
  Administered 2023-05-29 – 2023-05-31 (×3): 200 mg via ORAL
  Filled 2023-05-29 (×3): qty 1

## 2023-05-29 MED ORDER — LIDOCAINE-EPINEPHRINE 1 %-1:100000 IJ SOLN
INTRAMUSCULAR | Status: AC
  Filled 2023-05-29: qty 1

## 2023-05-29 MED ORDER — BUPIVACAINE HCL 0.5 % IJ SOLN
INTRAMUSCULAR | Status: DC | PRN
  Administered 2023-05-29: 5 mL

## 2023-05-29 MED ORDER — PROPOFOL 500 MG/50ML IV EMUL
INTRAVENOUS | Status: DC | PRN
  Administered 2023-05-29: 50 ug/kg/min via INTRAVENOUS

## 2023-05-29 MED ORDER — PROPOFOL 10 MG/ML IV BOLUS
INTRAVENOUS | Status: DC | PRN
  Administered 2023-05-29: 20 mg via INTRAVENOUS
  Administered 2023-05-29: 40 mg via INTRAVENOUS

## 2023-05-29 MED ORDER — POVIDONE-IODINE 10 % EX SWAB: 2.0000 | Freq: Once | CUTANEOUS | Status: DC

## 2023-05-29 MED ORDER — BUPIVACAINE HCL (PF) 0.5 % IJ SOLN
INTRAMUSCULAR | Status: AC
  Filled 2023-05-29: qty 30

## 2023-05-29 MED ORDER — VANCOMYCIN HCL IN DEXTROSE 1-5 GM/200ML-% IV SOLN
1000.0000 mg | Freq: Two times a day (BID) | INTRAVENOUS | Status: DC
  Administered 2023-05-29 – 2023-05-30 (×4): 1000 mg via INTRAVENOUS
  Filled 2023-05-29 (×5): qty 200

## 2023-05-29 MED ORDER — LIDOCAINE HCL (PF) 2 % IJ SOLN
INTRAMUSCULAR | Status: AC
  Filled 2023-05-29: qty 5

## 2023-05-29 MED ORDER — LIDOCAINE-EPINEPHRINE 1 %-1:100000 IJ SOLN
INTRAMUSCULAR | Status: DC | PRN
  Administered 2023-05-29: 5 mL

## 2023-05-29 SURGICAL SUPPLY — 53 items
BLADE OSC/SAGITTAL MD 5.5X18 (BLADE) IMPLANT
BLADE SURG 15 STRL LF DISP TIS (BLADE) ×1 IMPLANT
BLADE SW THK.38XMED LNG THN (BLADE) IMPLANT
BNDG ELASTIC 4INX 5YD STR LF (GAUZE/BANDAGES/DRESSINGS) ×1 IMPLANT
BNDG ESMARCH 4X12 STRL LF (GAUZE/BANDAGES/DRESSINGS) ×1 IMPLANT
BNDG STRETCH 4X75 STRL LF (GAUZE/BANDAGES/DRESSINGS) IMPLANT
BNDG STRETCH GAUZE 3IN X12FT (GAUZE/BANDAGES/DRESSINGS) ×1 IMPLANT
CANISTER WOUND CARE 500ML ATS (WOUND CARE) ×1 IMPLANT
CRADLE LAMINECT ARM (MISCELLANEOUS) IMPLANT
CUFF TOURN SGL QUICK 12 (TOURNIQUET CUFF) IMPLANT
CUFF TOURN SGL QUICK 18X4 (TOURNIQUET CUFF) IMPLANT
DRAPE FLUOR MINI C-ARM 54X84 (DRAPES) IMPLANT
DRAPE XRAY CASSETTE 23X24 (DRAPES) IMPLANT
DRSG MEPILEX FLEX 3X3 (GAUZE/BANDAGES/DRESSINGS) IMPLANT
DURAPREP 26ML APPLICATOR (WOUND CARE) ×1 IMPLANT
ELECTRODE REM PT RTRN 9FT ADLT (ELECTROSURGICAL) ×1 IMPLANT
GAUZE SPONGE 4X4 12PLY STRL (GAUZE/BANDAGES/DRESSINGS) ×1 IMPLANT
GAUZE STRETCH 2X75IN STRL (MISCELLANEOUS) ×1 IMPLANT
GAUZE XEROFORM 1X8 LF (GAUZE/BANDAGES/DRESSINGS) ×1 IMPLANT
GLOVE BIO SURGEON STRL SZ7.5 (GLOVE) ×1 IMPLANT
GLOVE INDICATOR 8.0 STRL GRN (GLOVE) ×1 IMPLANT
GOWN STRL REUS W/ TWL LRG LVL3 (GOWN DISPOSABLE) ×1 IMPLANT
GOWN STRL REUS W/TWL MED LVL3 (GOWN DISPOSABLE) ×1 IMPLANT
GOWN STRL REUS W/TWL XL LVL4 (GOWN DISPOSABLE) ×1 IMPLANT
HANDPIECE VERSAJET DEBRIDEMENT (MISCELLANEOUS) IMPLANT
IV NS 1000ML BAXH (IV SOLUTION) ×1 IMPLANT
KIT DRSG VAC SLVR GRANUFM (MISCELLANEOUS) ×1 IMPLANT
KIT TURNOVER KIT A (KITS) ×1 IMPLANT
LABEL OR SOLS (LABEL) ×1 IMPLANT
MANIFOLD NEPTUNE II (INSTRUMENTS) ×1 IMPLANT
NDL FILTER BLUNT 18X1 1/2 (NEEDLE) ×1 IMPLANT
NDL HYPO 25X1 1.5 SAFETY (NEEDLE) ×1 IMPLANT
NEEDLE FILTER BLUNT 18X1 1/2 (NEEDLE) ×1 IMPLANT
NEEDLE HYPO 25X1 1.5 SAFETY (NEEDLE) ×1 IMPLANT
NS IRRIG 500ML POUR BTL (IV SOLUTION) ×1 IMPLANT
PACK EXTREMITY ARMC (MISCELLANEOUS) ×1 IMPLANT
PACKING GAUZE IODOFORM 1INX5YD (GAUZE/BANDAGES/DRESSINGS) ×1 IMPLANT
PAD ABD DERMACEA PRESS 5X9 (GAUZE/BANDAGES/DRESSINGS) ×1 IMPLANT
PENCIL SMOKE EVACUATOR (MISCELLANEOUS) ×1 IMPLANT
RASP SM TEAR CROSS CUT (RASP) IMPLANT
SHIELD FULL FACE ANTIFOG 7M (MISCELLANEOUS) ×1 IMPLANT
SOL .9 NS 3000ML IRR UROMATIC (IV SOLUTION) ×1 IMPLANT
STOCKINETTE IMPERVIOUS 9X36 MD (GAUZE/BANDAGES/DRESSINGS) ×1 IMPLANT
SUT ETHILON 2 0 FS 18 (SUTURE) ×2 IMPLANT
SUT VIC AB 3-0 SH 27X BRD (SUTURE) ×1 IMPLANT
SUTURE EHLN 3-0 FS-10 30 BLK (SUTURE) IMPLANT
SUTURE ETHLN 4-0 FS2 18XMF BLK (SUTURE) ×1 IMPLANT
SWAB CULTURE AMIES ANAERIB BLU (MISCELLANEOUS) IMPLANT
SYR 10ML LL (SYRINGE) ×1 IMPLANT
SYR 3ML LL SCALE MARK (SYRINGE) ×1 IMPLANT
TIP FAN IRRIG PULSAVAC PLUS (DISPOSABLE) ×1 IMPLANT
TRAP FLUID SMOKE EVACUATOR (MISCELLANEOUS) ×1 IMPLANT
WATER STERILE IRR 500ML POUR (IV SOLUTION) ×1 IMPLANT

## 2023-05-29 NOTE — Anesthesia Preprocedure Evaluation (Signed)
 Anesthesia Evaluation  Patient identified by MRN, date of birth, ID band Patient awake    Reviewed: Allergy & Precautions, H&P , NPO status , Patient's Chart, lab work & pertinent test results  History of Anesthesia Complications Negative for: history of anesthetic complications  Airway Mallampati: III  TM Distance: >3 FB Neck ROM: full    Dental  (+) Chipped, Dental Advidsory Given, Upper Dentures   Pulmonary neg pulmonary ROS, neg sleep apnea, neg COPD, Patient abstained from smoking.Not current smoker   Pulmonary exam normal breath sounds clear to auscultation       Cardiovascular Exercise Tolerance: Good METShypertension, Pt. on medications + CAD, + CABG and + Peripheral Vascular Disease  (-) Past MI Normal cardiovascular exam(-) dysrhythmias  Rhythm:regular Rate:Normal     Neuro/Psych CVA, No Residual Symptoms  negative psych ROS   GI/Hepatic negative GI ROS, Neg liver ROS,neg GERD  ,,  Endo/Other  diabetes, Insulin  Dependent    Renal/GU negative Renal ROS  negative genitourinary   Musculoskeletal   Abdominal   Peds  Hematology negative hematology ROS (+)   Anesthesia Other Findings Past Medical History: No date: Arthritis No date: CAD (coronary artery disease) No date: Colon polyp No date: Diabetes mellitus without complication (HCC) No date: Hyperlipemia No date: Hypertension No date: Orchitis No date: PVD (peripheral vascular disease) (HCC) No date: Stroke (cerebrum) (HCC) No date: Tremor No date: Wears dentures  Past Surgical History: 04/02/2020: ACHILLES TENDON SURGERY; Right     Comment:  Procedure: ACHILLES LENGTHENING;  Surgeon: Pink Bridges, DPM;  Location: [facility];  Service: Podiatry;                Laterality: Right; 08/21/2016: AMPUTATION; Right     Comment:  Procedure: RAY RESECTION-RIGHT 5TH RAY;  Surgeon: Angel Barba, DPM;  Location: [facility];  Service:  Podiatry;                Laterality: Right; No date: AMPUTATION TOE; Left     Comment:  big 06/25/2021: BONE EXOSTOSIS EXCISION; Right     Comment:  Procedure: EXOSTOSIS RESECTION;  Surgeon: Pink Bridges,              DPM;  Location: [facility];  Service: Podiatry;               Laterality: Right; No date: CAROTID ENDARTERECTOMY; Right No date: COLON SURGERY No date: CORONARY ARTERY BYPASS GRAFT 03/31/2020: LOWER EXTREMITY ANGIOGRAPHY; Right     Comment:  Procedure: Lower Extremity Angiography;  Surgeon: Celso College, MD;  Location: [facility];  Service:               Cardiovascular;  Laterality: Right; 11/25/2022: LOWER EXTREMITY ANGIOGRAPHY; Left     Comment:  Procedure: Lower Extremity Angiography;  Surgeon: Celso College, MD;  Location: [facility];  Service:               Cardiovascular;  Laterality: Left; No date: ROTATOR CUFF REPAIR; Right 04/02/2020: TRANSMETATARSAL AMPUTATION; Right     Comment:  Procedure: TRANSMETATARSAL AMPUTATION;  Surgeon: Larey Plenty,  Debria Fang, DPM;  Location: [facility];  Service: Podiatry;                Laterality: Right;  BMI    Body Mass Index: 31.00 kg/m      Reproductive/Obstetrics negative OB ROS                             Anesthesia Physical Anesthesia Plan  ASA: 3  Anesthesia Plan: General   Post-op Pain Management: Minimal or no pain anticipated   Induction: Intravenous  PONV Risk Score and Plan: 2 and Propofol  infusion, TIVA, Midazolam , Ondansetron  and Dexamethasone  Airway Management Planned: Natural Airway and Nasal Cannula  Additional Equipment: None  Intra-op Plan:   Post-operative Plan:   Informed Consent: I have reviewed the patients History and Physical, chart, labs and discussed the procedure including the risks, benefits and alternatives for the proposed anesthesia with the patient or authorized representative who has indicated  his/her understanding and acceptance.     Dental Advisory Given  Plan Discussed with: Anesthesiologist, CRNA and Surgeon  Anesthesia Plan Comments: (Discussed risks of anesthesia with patient, including possibility of difficulty with spontaneous ventilation under anesthesia necessitating airway intervention, PONV, and rare risks such as cardiac or respiratory or neurological events, and allergic reactions. Discussed the role of CRNA in patient's perioperative care. Patient understands.)       Anesthesia Quick Evaluation

## 2023-05-29 NOTE — Transfer of Care (Signed)
 Immediate Anesthesia Transfer of Care Note  Patient: Jason [name]  Procedure(s) Performed: IRRIGATION AND DEBRIDEMENT ABSCESS (Right: Leg Upper)  Patient Location: PACU  Anesthesia Type:General  Level of Consciousness: awake  Airway & Oxygen Therapy: Patient Spontanous Breathing and Patient connected to face mask oxygen  Post-op Assessment: Report given to RN and Post -op Vital signs reviewed and stable  Post vital signs: Reviewed and stable  Last Vitals:  Vitals Value Taken Time  BP 179/71 05/29/23 1711  Temp 36.7 C 05/29/23 1710  Pulse 52 05/29/23 1712  Resp 11 05/29/23 1712  SpO2 100 % 05/29/23 1712  Vitals shown include unfiled device data.  Last Pain:  Vitals:   05/29/23 1710  TempSrc:   PainSc: Asleep      Patients Stated Pain Goal: 0 (05/27/23 2235)  Complications: No notable events documented.

## 2023-05-29 NOTE — Progress Notes (Addendum)
 [facility]  - [place] at [facility]   PATIENT NAME: Jason [name]    MR#:  [id number]  DATE OF BIRTH:  1945-05-23  SUBJECTIVE:   No fever. Worried about right ankle swelling   VITALS:  Blood pressure (!) 159/72, pulse 65, temperature 98.1 F (36.7 C), temperature source Oral, resp. rate 16, height 5\' 10"  (1.778 m), weight 97.5 kg, SpO2 96%.  PHYSICAL EXAMINATION:   GENERAL:  78 y.o.-year-old patient with no acute distress.  LUNGS: Normal breath sounds bilaterally, no wheezing CARDIOVASCULAR: S1, S2 normal. No murmur   ABDOMEN: Soft, nontender, nondistended. Bowel sounds present.  EXTREMITIES:  NEUROLOGIC: nonfocal  patient is alert and awake   LABORATORY PANEL:  CBC Recent Labs  Lab 05/28/23 0401  WBC 5.6  HGB 11.1*  HCT 32.8*  PLT 225    Chemistries  Recent Labs  Lab 05/28/23 0401  NA 137  K 3.5  CL 105  CO2 24  GLUCOSE 201*  BUN 15  CREATININE 0.79  CALCIUM  8.6*   Cardiac Enzymes No results for input(s): "TROPONINI" in the last 168 hours. RADIOLOGY:  DG Tibia/Fibula Right Result Date: 05/28/2023 CLINICAL DATA:  Cellulitis. EXAM: RIGHT TIBIA AND FIBULA - 2 VIEW COMPARISON:  None Available. FINDINGS: There is no evidence of fracture or other focal bone lesions. Soft tissues are unremarkable. IMPRESSION: Negative. Electronically Signed   By: Rosalene Colon M.D.   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 Advanced degenerative changes throughout the midfoot and hindfoot. Plantar calcaneal spur. No bone destruction to suggest osteomyelitis. No fracture. There appears to be mild diffuse soft tissue swelling. No soft tissue gas or radiopaque foreign body. IMPRESSION: No acute bony abnormality. Electronically Signed   By: Janeece Mechanic M.D.   On:  05/27/2023 19:40    Assessment and Plan  Jason [name] is a 78 y.o. male with medical history significant for CAD, CVA, DM, HLD, HTN, PAD s/p right TMA, left great toe amputation, stents left leg   sent in by podiatry for admission for treatment of cellulitis of right lower extremity.  Patient presented with 2-week history of redness extending from the stump up to mid calf associated with pain and swelling.  He has no fever or chills. He was also noted to have an additional area of swelling on the right lower leg that was aspirated in the Podiatry office without any return.   Xray Tib/Fib and Foot x-ray shows no acute bony abnormality   Cellulitis of right lower extremity and Plantar wound right foot --Rocephin  and vancomycin --change to oral abxs in am. Seen by podiatry--overall improving with IV abxs. Cont IV abxs  --prn Pain control --Keep leg elevated --Podiatry consult noted by Dr Althea Atkinson -- blood culture negative --MRI Tib/Fib right ext 1. Within the distal lateral calf, centered approximately 9 cm proximal from the distal fibula, there is a mildly complex crescentic walled-off collection suspicious for an abscess given the clinical history, measuring up to approximately 0.6 cm in transverse dimension, 5.9 cm in AP dimension, and 5.5 cm in craniocaudal length. This closely apposes the superficial/lateral fascial plane of the peroneus longus and brevis muscles. It does not appear to infiltrate the muscle. 2. No cortical erosion or marrow edema is seen to indicate MRI evidence of acute osteomyelitis. 3.  There is fatty infiltration, atrophy, and edema seen throughout the calf musculature. 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  --for I and D today by Dr Althea Atkinson   Uncontrolled type 2 diabetes mellitus with hyperglycemia, with long-term current use of insulin  (HCC) --Continue basal insulin  adjust dose since sugars high --Sliding scale coverage --resume po metformin  and glimipride   PVD (peripheral vascular disease) (HCC) History of right TMA, left great toe amputation and stent angioplasty left leg --Continue aspirin  and rosuvastatin    Essential hypertension --Continue home propranolol  and ramipril    History of CVA (cerebrovascular accident) --Continue aspirin  and rosuvastatin    CAD (coronary artery disease) --Continue aspirin  and rosuvastatin      Procedures: Family communication :none Consults :Podiatry CODE STATUS: full DVT Prophylaxis :lovenox  Level of care: Med-Surg  Pt for I and d today right LE abscess   TOTAL TIME TAKING CARE OF THIS PATIENT: 35 minutes.  >50% time spent on counselling and coordination of care  Note: This dictation was prepared with Dragon dictation along with smaller phrase technology. Any transcriptional errors that result from this process are unintentional.  Melvinia Stager M.D    [facility]   CC: Primary care physician; Lorrie Rothman, MD

## 2023-05-29 NOTE — Progress Notes (Addendum)
 Daily Progress Note   Subjective  - * No surgery found *  Follow-up right lower leg cellulitis.  No complaints today.  Objective Vitals:   05/28/23 1630 05/28/23 2019 05/29/23 0322 05/29/23 0727  BP: (!) 176/70 (!) 167/75 (!) 154/67 (!) 159/72  Pulse:  63 65 65  Resp:  18 18 16   Temp:  98.4 F (36.9 C) 98.5 F (36.9 C) 98.1 F (36.7 C)  TempSrc:    Oral  SpO2:  97% 96% 96%  Weight:      Height:        Physical Exam: The diffuse cellulitis has continued improved.  There is to look small area of irritation erythema at the distal one third of the lower leg.  Still darkened discoloration laterally.  Possible palpable fluctuance to the area.  The plantar right forefoot ulceration is stable.  It is actually somewhat improved from offloading and pressure reduction being in the hospital.  No active drainage from the ulcerative site.  X-ray of the right ankle is stable.  No erosive changes.  No gas in the soft tissue.  Laboratory CBC    Component Value Date/Time   WBC 5.6 05/28/2023 0401   HGB 11.1 (L) 05/28/2023 0401   HCT 32.8 (L) 05/28/2023 0401   PLT 225 05/28/2023 0401    BMET    Component Value Date/Time   NA 137 05/28/2023 0401   K 3.5 05/28/2023 0401   CL 105 05/28/2023 0401   CO2 24 05/28/2023 0401   GLUCOSE 201 (H) 05/28/2023 0401   BUN 15 05/28/2023 0401   CREATININE 0.79 05/28/2023 0401   CALCIUM  8.6 (L) 05/28/2023 0401   GFRNONAA >60 05/28/2023 0401   GFRAA >60 08/23/2016 0409    Assessment/Planning: Continued diffuse cellulitis right lower extremity Concern for possible focal abscess distal lateral right fibula Diabetes with neuropathy Right foot ulceration  Will order MRI of the distal tib-fib at this time.  If this is concerning for abscess will plan for I&D this evening.  If this is negative for abscess would recommend oral antibiotics and we can closely follow-up in the outpatient clinic. Discussed this with internal medicine today.  Patient is  n.p.o. for now.  Await MRI results. I discussed the possibility of surgically and the risk benefits alternatives and complications associated with surgery.  Patient has given consent if we need to proceed with surgery today.  Jason [name] A  05/29/2023, 11:16 AM  Addendum:  Pts MRI was consistent with abscess to distal lateral ankle. Will proceed with planned I & D.    IMPRESSION: 1. Within the distal lateral calf, centered approximately 9 cm proximal from the distal fibula, there is a mildly complex crescentic walled-off collection suspicious for an abscess given the clinical history, measuring up to approximately 0.6 cm in transverse dimension, 5.9 cm in AP dimension, and 5.5 cm in craniocaudal length. This closely apposes the superficial/lateral fascial plane of the peroneus longus and brevis muscles. It does not appear to infiltrate the muscle. 2. No cortical erosion or marrow edema is seen to indicate MRI evidence of acute osteomyelitis. 3. There is fatty infiltration, atrophy, and edema seen throughout the calf musculature. 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

## 2023-05-29 NOTE — Progress Notes (Signed)
 Pt transferred to OR via bed with OR tech.

## 2023-05-29 NOTE — Progress Notes (Signed)
 Patient underwent I&D of abscess.  Noted purulent drainage from the inflamed fluctuant area.  Wound is packed with iodoform packing and a large bulky dressing applied.  Will perform dressing change tomorrow.  If wound is stable at that point suspect can be discharged on p.o. antibiotics and close follow-up in the outpatient clinic.

## 2023-05-29 NOTE — Op Note (Addendum)
 Operative note   Surgeon:Raimundo Corbit [employer] forces logistics/support/administrative officer: None    Preop diagnosis: Right lower leg abscess    Postop diagnosis: Same    Procedure: Incision and drainage right distal lower leg/ankle abscess    EBL: Minimal    Anesthesia: IV sedation with local.  Local consist of a total of a one-to-one mixture of 0.5% plain bupivacaine  and 1% lidocaine  with epinephrine in a local block fashion    Hemostasis: Lidocaine  with epinephrine    Specimen: Wound culture     Complications: None    Operative indications:Jason [name] is an 78 y.o. that presents today for surgical intervention.  The risks/benefits/alternatives/complications have been discussed and consent has been given.    Procedure:  Patient was brought into the OR and placed on the operating table in thesupine position. After anesthesia was obtained theright lower extremity was prepped and draped in usual sterile fashion.  Attention was directed to the lateral aspect of the right lower leg where an area of palpable fluctuance was noted.  Surrounding erythema was noted throughout.  At this time an incision was made to the superficial skin.  Dissection carried down through the superficial subcutaneous tissue to the fascia.  At this time purulence was noted and expressed from the area.  A deep wound culture was performed at this point.  The area was then infiltrated with pulse lavage.  The fascia was noted to be intact.  No purulence was noted to express from the deeper fascial layer.  Small stab incision was made overlying the muscle belly without any signs of purulent drainage from the muscle belly itself.  After final flush the approximate 3 cm incision was reanastomosed with the most proximal area left open and packed with iodoform packing.  3-0 nylon was used for interrupted closure.  A bulky sterile dressing was applied.    Patient tolerated the procedure and anesthesia well.  Was transported from the OR to the PACU with all vital  signs stable and vascular status intact. To be discharged per routine protocol.  Will follow up in approximately 1 week in the outpatient clinic.

## 2023-05-29 NOTE — Anesthesia Postprocedure Evaluation (Signed)
 Anesthesia Post Note  Patient: Jason [name]  Procedure(s) Performed: IRRIGATION AND DEBRIDEMENT ABSCESS (Right: Leg Upper)  Patient location during evaluation: PACU Anesthesia Type: General Level of consciousness: awake and alert Pain management: pain level controlled Vital Signs Assessment: post-procedure vital signs reviewed and stable Respiratory status: spontaneous breathing, nonlabored ventilation, respiratory function stable and patient connected to nasal cannula oxygen Cardiovascular status: blood pressure returned to baseline and stable Postop Assessment: no apparent nausea or vomiting Anesthetic complications: no   No notable events documented.   Last Vitals:  Vitals:   05/29/23 1811 05/29/23 1836  BP: (!) 196/76 (!) 158/69  Pulse:  (!) 58  Resp:    Temp:    SpO2:  99%    Last Pain:  Vitals:   05/29/23 1730  TempSrc:   PainSc: 0-No pain                 Lattie Poli

## 2023-05-30 ENCOUNTER — Encounter: Payer: Self-pay | Admitting: Podiatry

## 2023-05-30 DIAGNOSIS — L03115 Cellulitis of right lower limb: Secondary | ICD-10-CM | POA: Diagnosis not present

## 2023-05-30 LAB — GLUCOSE, CAPILLARY
Glucose-Capillary: 120 mg/dL — ABNORMAL HIGH (ref 70–99)
Glucose-Capillary: 174 mg/dL — ABNORMAL HIGH (ref 70–99)
Glucose-Capillary: 196 mg/dL — ABNORMAL HIGH (ref 70–99)
Glucose-Capillary: 146 mg/dL — ABNORMAL HIGH (ref 70–99)
Glucose-Capillary: 173 mg/dL — ABNORMAL HIGH (ref 70–99)

## 2023-05-30 LAB — BASIC METABOLIC PANEL WITH GFR
Anion gap: 6 (ref 5–15)
BUN: 19 mg/dL (ref 8–23)
CO2: 25 mmol/L (ref 22–32)
Calcium: 8.8 mg/dL — ABNORMAL LOW (ref 8.9–10.3)
Chloride: 104 mmol/L (ref 98–111)
Creatinine, Ser: 0.74 mg/dL (ref 0.61–1.24)
GFR, Estimated: >60 mL/min (ref >60)
Glucose, Bld: 197 mg/dL — ABNORMAL HIGH (ref 70–99)
Potassium: 3.9 mmol/L (ref 3.5–5.1)
Sodium: 135 mmol/L (ref 135–145)

## 2023-05-30 NOTE — Progress Notes (Addendum)
 Patient agreed to take pain medication. Norco 5-325mg  was administered for pain scale 2/10.

## 2023-05-30 NOTE — Consult Note (Signed)
 Pharmacy Antibiotic Note  Jason [name] is a 78 y.o. male admitted on 05/27/2023 with cellulitis.  Pharmacy has been consulted for vancomycin  dosing. Patient underwent I&D yesterday; cultures were obtained. Blood cultures no growth to date after 3 days.  Plan: Day #3 of antibiotics Continue vancomycin  1000mg  IV q12H eAUC 504, Cmax 31, Cmin 15 Scr 0.8, IBW, Vd 0.72 L/kg Vancomycin  levels tonight Follow renal function and I&D cultures for adjustments  Height: 5\' 10"  (177.8 cm) Weight: 97.5 kg (215 lb) IBW/kg (Calculated) : 73  Temp (24hrs), Avg:98.1 F (36.7 C), Min:97.7 F (36.5 C), Max:98.3 F (36.8 C)  Recent Labs  Lab 05/27/23 1529 05/27/23 1530 05/27/23 1851 05/28/23 0401 05/30/23 0850  WBC 6.8  --   --  5.6  --   CREATININE 1.08  --   --  0.79 0.74  LATICACIDVEN  --  1.7 1.2  --   --     Estimated Creatinine Clearance: 90.6 mL/min (by C-G formula based on SCr of 0.74 mg/dL).    No Known Allergies  Antimicrobials this admission: cefepime  5/16 x 1 ceftriaxone  5/17 >>  vancomycin  5/16 >>  Microbiology results: 5/16 BCx: NGTD  Thank you for allowing pharmacy to be a part of this patient's care.  Will M. Alva Jewels, PharmD Clinical Pharmacist 05/30/2023 10:32 AM

## 2023-05-30 NOTE — Progress Notes (Signed)
 During night, patient c/o burning sensation on RLE of post op I&D. Dressing was taken down to assess extremity. Upon observation, RLE was mild red, warm to touch and with serosanguinous drainage on bulk dressing. Dressing was reinforced. Patient was offered pain medication but refused. Extremity elevated to help alleviate pain.

## 2023-05-30 NOTE — Inpatient Diabetes Management (Addendum)
 Inpatient Diabetes Program Recommendations  AACE/ADA: New Consensus Statement on Inpatient Glycemic Control (2015)  Target Ranges:  Prepandial:   less than 140 mg/dL      Peak postprandial:   less than 180 mg/dL (1-2 hours)      Critically ill patients:  140 - 180 mg/dL   Lab Results  Component Value Date   GLUCAP 196 (H) 05/30/2023   HGBA1C 9.7 (H) 05/27/2023    Review of Glycemic Control  Latest Reference Range & Units 05/29/23 18:04 05/29/23 22:07 05/30/23 07:58 05/30/23 11:53  Glucose-Capillary 70 - 99 mg/dL 098 (H) 119 (H) 147 (H) 196 (H)  (H): Data is abnormally high Diabetes history: Type 2 DM Outpatient Diabetes medications: Novolin R 13-18 units TID, Lantus  26 units every day, Metformin  1000 mg BID, Amaryl  4 mg QD Current orders for Inpatient glycemic control: Semglee  20 units at bedtime, Metformin  1000 mg BID, Amaryl  4 mg every day, Novolog  0-15 units TID & HS  Inpatient Diabetes Program Recommendations:    Consider increasing Semglee  24 units at bedtime.   Spoke with patient regarding outpatient patient management. Is followed by Dr Shelvy Dickens with last appointment 05/20/23. At this visit, endo increased Novolin R to assist with post prandials. Patient wears [employer] and uses a reader.  Reviewed patient's current A1c of 9.7%. Explained what a A1c is and what it measures. Also reviewed goal A1c with patient, importance of good glucose control @ home, and blood sugar goals. Reviewed patho of DM, need for continued adjustment, current inpatient goals, vascular changes, impact of infection and overall commorbidities.  Patient denies drinking sugary beverages and works towards overall CHO mindfulness. No additional questions at this time, plans to follow up.   Thanks, Marjo Sievert, MSN, RNC-OB Diabetes Coordinator [phone] (8a-5p)

## 2023-05-30 NOTE — Progress Notes (Signed)
 Daily Progress Note   Subjective  - 1 Day Post-Op  Patient is status post right foot wound debridement with I&D of right lower leg wound.  Patient notes minimal to no pain today and has left his dressings clean and intact.  He is currently resting in the bed fairly comfortably.  Objective Vitals:   05/29/23 1836 05/29/23 1949 05/30/23 0316 05/30/23 0801  BP: (!) 158/69 93/75 (!) 158/70 (!) 156/73  Pulse: (!) 58 64 64 63  Resp:  18 18 16   Temp:  98.1 F (36.7 C) 98.3 F (36.8 C) 97.7 F (36.5 C)  TempSrc:  Oral (P) Oral Oral  SpO2: 99% 99% 95% 96%  Weight:      Height:        Physical Exam: Fairly diffused erythema and edema still present to the right lower extremity especially around the area of the incisional site, the incision to the right anterior lateral lower leg appears to be well coapted with sutures intact, no purulence present, some serosanguineous fluid coming from the area packing that was removed today.  Right transmetatarsal amputation site with plantar lateral foot wound peers to be superficial at this time with no signs of infection present.    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 Still appears to have fairly diffuse erythema and edema present to the right lower leg.  Appears to be slightly improved  overall since procedure.  Applied Xeroform to the incisional area followed by 4 x 4 gauze, ABD, gauze roll, Ace wrap.  Continue with offloading dressing to the right plantar lateral transmetatarsal amputation site plantarly.  Recommend dressing changes daily by patient upon discharge. Dr. Althea Atkinson recommended placing patient on doxycycline  for 7-day course upon discharge. Believe patient should stay 1 more night for further evaluation of right lower leg due to the erythema still present to the area.  Will be looking for this to dissipate over the next 24 hours.  If appears to be improving patient can be discharged tomorrow with close follow-up with Dr. Althea Atkinson on outpatient basis.  Pink Bridges, DPM  05/30/2023, 3:00 PM

## 2023-05-30 NOTE — Progress Notes (Signed)
 [facility]  - [place] [facility]   PATIENT NAME: Jason [name]    MR#:  [id number]  DATE OF BIRTH:  18-May-1945  SUBJECTIVE:   No fever. S/p I and of right ankle abscess.   VITALS:  Blood pressure (!) 156/73, pulse 63, temperature 97.7 F (36.5 C), temperature source Oral, resp. rate 16, height 5\' 10"  (1.778 m), weight 97.5 kg, SpO2 96%.  PHYSICAL EXAMINATION:   GENERAL:  78 y.o.-year-old patient with no acute distress.  LUNGS: Normal breath sounds bilaterally, no wheezing CARDIOVASCULAR: S1, S2 normal. No murmur   ABDOMEN: Soft, nontender, nondistended. Bowel sounds present.  EXTREMITIES: right LE surgical dressing+ NEUROLOGIC: nonfocal  patient is alert and awake   LABORATORY PANEL:  CBC Recent Labs  Lab 05/28/23 0401  WBC 5.6  HGB 11.1*  HCT 32.8*  PLT 225    Chemistries  Recent Labs  Lab 05/30/23 0850  NA 135  K 3.9  CL 104  CO2 25  GLUCOSE 197*  BUN 19  CREATININE 0.74  CALCIUM  8.8*   Cardiac Enzymes No results for input(s): "TROPONINI" in the last 168 hours. RADIOLOGY:  MR TIBIA FIBULA RIGHT WO CONTRAST Result Date: 05/29/2023 CLINICAL DATA:  Soft tissue infection suspected, lower leg. History of diabetes. Clinical concern for abscess of the distal lateral calf at the level of the distal fibula. EXAM: MRI OF LOWER RIGHT EXTREMITY WITHOUT CONTRAST TECHNIQUE: Multiplanar, multisequence MR imaging of the right tibia and fibula was performed. No intravenous contrast was administered. COMPARISON:  right tibia and fibula radiographs 05/28/2023 FINDINGS: Bones/Joint/Cartilage There is chronic cystic change inferior to the right knee medial tibial spine measuring up to 1.4 cm in craniocaudal dimension. On coronal images that include the left lower extremity, similar cystic changes deep to the tibial spines measuring up to approximately 3.0 cm in craniocaudal dimension. Minimal a marrow edema within the anteromedial aspect of the proximal tibial  diaphysis (axial series 13, image 18). The overlying cortex appears intact and this may be stress related/reactive. No cortical erosion or marrow edema is seen to indicate MRI evidence of acute osteomyelitis. Ligaments No ligament tear is seen. Muscles and Tendons There is high-grade fatty infiltration of multiple muscles in the calf, greatest within the medial head of the gastrocnemius, soleus, posterior tibial, and tibialis anterior muscles. There is moderate patchy edema seen throughout the calf musculature, greatest within the tibialis anterior, extensor digitorum longus, flexor hallucis longus, and peroneus longus and brevis muscles. Soft tissues Within the proximal lateral calf subcutaneous fat there is edema and swelling measuring up to 7 mm in transverse thickness (axial series 13, image 13). This subcutaneous fat swelling involves an approximately 13.5 cm craniocaudal length of the proximal calf from the proximal fibula distally (coronal series 7 image 11). Within the distal lateral calf, centered approximately 9 cm proximal from the distal fibula, there is a walled-off increased T2 and decreased T1 signal mildly complex crescentic collection suspicious for an abscess given the clinical history, measuring up to approximately 0.6 cm in transverse dimension, 5.9 cm in AP dimension, and 5.5 cm in craniocaudal length. This closely apposes the superficial/lateral fascial plane of the peroneus longus and brevis muscles. It does not appear to infiltrate the muscle. IMPRESSION: 1. Within the distal lateral calf, centered approximately 9 cm proximal from the distal fibula, there is a mildly complex crescentic walled-off collection suspicious for an abscess given the clinical history, measuring up to approximately 0.6 cm in transverse dimension, 5.9 cm in AP dimension,  and 5.5 cm in craniocaudal length. This closely apposes the superficial/lateral fascial plane of the peroneus longus and brevis muscles. It does not  appear to infiltrate the muscle. 2. No cortical erosion or marrow edema is seen to indicate MRI evidence of acute osteomyelitis. 3. There is fatty infiltration, atrophy, and edema seen throughout the calf musculature. 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 Findings discussed with the patient's podiatrist, Dr. Althea Atkinson, at 2:50 p.m. on 05/29/2023. Electronically Signed   By: Bertina Broccoli M.D.   On: 05/29/2023 15:00    Assessment and Plan  Jason [name] is a 78 y.o. male with medical history significant for CAD, CVA, DM, HLD, HTN, PAD s/p right TMA, left great toe amputation, stents left leg   sent in by podiatry for admission for treatment of cellulitis of right lower extremity.  Patient presented with 2-week history of redness extending from the stump up to mid calf associated with pain and swelling.  He has no fever or chills. He was also noted to have an additional area of swelling on the right lower leg that was aspirated in the Podiatry office without any return.   Xray Tib/Fib and Foot x-ray shows no acute bony abnormality   Cellulitis of right lower extremity and Plantar wound right foot --Rocephin  and vancomycin --change to oral abxs in am. Seen by podiatry--overall improving with IV abxs. Cont IV abxs  --prn Pain control --Keep leg elevated --Podiatry consult noted by Dr Althea Atkinson -- blood culture negative --MRI Tib/Fib right ext 1. Within the distal lateral calf, centered approximately 9 cm proximal from the distal fibula, there is a mildly complex crescentic walled-off collection suspicious for an abscess given the clinical history, measuring up to approximately 0.6 cm in transverse dimension, 5.9 cm in AP dimension, and 5.5 cm in craniocaudal length. This closely apposes the superficial/lateral fascial plane of the peroneus longus and brevis muscles. It does not appear to infiltrate the muscle. 2. No cortical erosion or marrow edema is seen to indicate MRI evidence of  acute osteomyelitis. 3. There is fatty infiltration, atrophy, and edema seen throughout the calf musculature. 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 --5/18--s/p  I and D of right ankle abscess by Dr Althea Atkinson --5/20-- Per Dr Larey Plenty "still angry looking" cont IV abxs --WC no growth so far   Uncontrolled type 2 diabetes mellitus with hyperglycemia, with long-term current use of insulin  (HCC) --Continue basal insulin  adjust dose since sugars high --Sliding scale coverage --resume po metformin  and glimipride   PVD (peripheral vascular disease) (HCC) History of right TMA, left great toe amputation and stent angioplasty left leg --Continue aspirin  and rosuvastatin    Essential hypertension --Continue home propranolol  and ramipril    History of CVA (cerebrovascular accident) --Continue aspirin  and rosuvastatin    CAD (coronary artery disease) --Continue aspirin  and rosuvastatin      Procedures: Family communication :none Consults :Podiatry CODE STATUS: full DVT Prophylaxis :lovenox  Level of care: Med-Surg  Per Dr Larey Plenty cont IV abxs for another day   TOTAL TIME TAKING CARE OF THIS PATIENT: 35 minutes.  >50% time spent on counselling and coordination of care  Note: This dictation was prepared with Dragon dictation along with smaller phrase technology. Any transcriptional errors that result from this process are unintentional.  Melvinia Stager M.D    [facility]   CC: Primary care physician; Lorrie Rothman, MD

## 2023-05-30 NOTE — Discharge Instructions (Addendum)
 Daily dressing changes:  Clean wound with betadine  and apply gauze dressing and cover with ace wrap every day.

## 2023-05-31 DIAGNOSIS — L03115 Cellulitis of right lower limb: Secondary | ICD-10-CM | POA: Diagnosis not present

## 2023-05-31 LAB — GLUCOSE, CAPILLARY
Glucose-Capillary: 146 mg/dL — ABNORMAL HIGH (ref 70–99)
Glucose-Capillary: 168 mg/dL — ABNORMAL HIGH (ref 70–99)
Glucose-Capillary: 195 mg/dL — ABNORMAL HIGH (ref 70–99)

## 2023-05-31 LAB — VANCOMYCIN, TROUGH: Vancomycin Tr: 15 ug/mL (ref 15–20)

## 2023-05-31 LAB — VANCOMYCIN, PEAK: Vancomycin Pk: 32 ug/mL (ref 30–40)

## 2023-05-31 MED ORDER — VANCOMYCIN HCL 1500 MG/300ML IV SOLN
1500.0000 mg | INTRAVENOUS | Status: DC
  Filled 2023-05-31: qty 300

## 2023-05-31 MED ORDER — HYDROCODONE-ACETAMINOPHEN 5-325 MG PO TABS: 1.0000 | ORAL_TABLET | Freq: Three times a day (TID) | ORAL | 0 refills | Status: AC | PRN

## 2023-05-31 MED ORDER — DOXYCYCLINE HYCLATE 100 MG PO TABS
100.0000 mg | ORAL_TABLET | Freq: Two times a day (BID) | ORAL | Status: DC
  Administered 2023-05-31: 100 mg via ORAL
  Filled 2023-05-31: qty 1

## 2023-05-31 MED ORDER — DOXYCYCLINE HYCLATE 100 MG PO TABS: 100.0000 mg | ORAL_TABLET | Freq: Two times a day (BID) | ORAL | 0 refills | Status: AC

## 2023-05-31 NOTE — Progress Notes (Signed)
 AVS Discharge instructions provided to pt with all questions answered at this time. All PIVs removed, sites WDL. Orders for dressing changes explained by this RN. Pt states his son will be picking him up this afternoon.

## 2023-05-31 NOTE — TOC Initial Note (Signed)
 Transition of Care [facility]) - Initial/Assessment Note    Patient Details  Name: Jason [name] MRN: [id number] Date of Birth: Jun 29, 1945  Transition of [facility]) CM/SW Contact:    Crayton Docker, RN 05/31/2023, 4:35 PM  Clinical Narrative:                  CM to patient's room regarding TOC screening . CM introduced case management role and discharge care planning process. Patient verbalized understanding and agreement with screening interview. Patient lives with son in law and step daughter. Patient states has a rolling walker and Libre CGM. CM and patient discussed discharge care planning with son, Quy on phone: [phone] and home health order. Patient and patient's son verbalized understanding and agreement with home health services. Per patient, patient's son, Anias will pick up patient around 1800  CM secure message to [facility] regarding home health RN order. Per Randel Buss, agency has accepted patient for home health RN services.   Expected Discharge Plan: Home w Home Health Services Barriers to Discharge: No Barriers Identified   Patient Goals and CMS Choice    Home/self care    Expected Discharge Plan and Services    Home/self care   Living arrangements for the past 2 months: Single Family Home Expected Discharge Date: 05/31/23                 [facility] Agency: [facility]   Prior Living Arrangements/Services Living arrangements for the past 2 months: Single Family Home Lives with:: Self, Pets (daughter in law and son in Social worker) Patient language and need for interpreter reviewed:: No Do you feel safe going back to the place where you live?: Yes      Need for Family Participation in Patient Care: Yes (Comment) Care giver support system in place?: Yes (comment) Current home services: DME (2 wheeled walker, libre CGM) Criminal Activity/Legal Involvement Pertinent to Current Situation/Hospitalization: No - Comment as needed  Activities of Daily  Living   ADL Screening (condition at time of admission) Independently performs ADLs?: Yes (appropriate for developmental age) Is the patient deaf or have difficulty hearing?: No Does the patient have difficulty seeing, even when wearing glasses/contacts?: No Does the patient have difficulty concentrating, remembering, or making decisions?: No  Permission Sought/Granted Permission sought to share information with : Case Manager, Family Supports    Share Information with NAME: Gedalia Mcmillon     Permission granted to share info w Relationship: daughter/son  Permission granted to share info w Contact Information: yes  Emotional Assessment Appearance:: Appears stated age Attitude/Demeanor/Rapport: Engaged Affect (typically observed): Calm Orientation: : Oriented to Self, Oriented to Place, Oriented to  Time, Oriented to Situation Alcohol / Substance Use: Not Applicable Psych Involvement: No (comment)  Admission diagnosis:  Cellulitis [L03.90] Cellulitis of right lower extremity [L03.115] Type 2 diabetes mellitus with diabetic polyneuropathy, with long-term current use of insulin  (HCC) [E11.42, Z79.4] Patient Active Problem List   Diagnosis Date Noted   Uncontrolled type 2 diabetes mellitus with hyperglycemia, with long-term current use of insulin  (HCC) 05/27/2023   Cellulitis of right lower extremity 05/27/2023   Critical limb ischemia of left lower extremity (HCC) 11/25/2022   Foot osteomyelitis, left (HCC) 11/24/2022   Pyogenic inflammation of bone (HCC)    Diabetic ulcer of right midfoot associated with type 2 diabetes mellitus, with muscle involvement without evidence of necrosis (HCC)    PVD (peripheral vascular disease) (HCC)    Cellulitis 03/29/2020  History of CVA (cerebrovascular accident) 03/29/2020   CAD (coronary artery disease) 03/29/2020   Essential hypertension 03/29/2020   Type 2 diabetes mellitus with hyperlipidemia (HCC) 03/29/2020   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 [place] - [address] [place] [address] Phone: [phone] Fax: [phone]     Social Drivers of Health (SDOH) Social History: SDOH Screenings   Food Insecurity: No Food Insecurity (05/27/2023)  Housing: Low Risk  (05/27/2023)  Transportation Needs: No Transportation Needs (05/27/2023)  Utilities: Not At Risk (05/27/2023)  Financial Resource Strain: Medium Risk (12/08/2022)   Received from [facility]  Social Connections: Moderately Isolated (05/27/2023)  Tobacco Use: Low Risk  (05/27/2023)  Recent Concern: Tobacco Use - Medium Risk (05/27/2023)   Received from [facility]   SDOH Interventions:     Readmission Risk Interventions     No data to display

## 2023-05-31 NOTE — Consult Note (Signed)
 Pharmacy Antibiotic Note  Jason [name] is a 78 y.o. male admitted on 05/27/2023 with cellulitis.  Pharmacy has been consulted for vancomycin  dosing. Patient underwent I&D on 5/18 where abscess cultures were obtained -- they are no growth to date. Blood cultures also no growth to date after 4 days.  Monitoring: 05/19 @ 2127 Vancomycin  bag started, 60 minute infusion 05/19 @ 2313 Vpk = 32 05/20 @ 1031 Vtr = 15  Plan: Day #4 of antibiotics Change vancomycin  dose to 1500mg  IV q24H eAUC 430, Cmax 34.3, Cmin 7.6 Follow renal function and I&D cultures for adjustments  Height: 5\' 10"  (177.8 cm) Weight: 97.5 kg (215 lb) IBW/kg (Calculated) : 73  Temp (24hrs), Avg:98.1 F (36.7 C), Min:97.9 F (36.6 C), Max:98.4 F (36.9 C)  Recent Labs  Lab 05/27/23 1529 05/27/23 1530 05/27/23 1851 05/28/23 0401 05/30/23 0850 05/30/23 2313 05/31/23 1031  WBC 6.8  --   --  5.6  --   --   --   CREATININE 1.08  --   --  0.79 0.74  --   --   LATICACIDVEN  --  1.7 1.2  --   --   --   --   VANCOTROUGH  --   --   --   --   --   --  15  VANCOPEAK  --   --   --   --   --  32  --     Estimated Creatinine Clearance: 90.6 mL/min (by C-G formula based on SCr of 0.74 mg/dL).    No Known Allergies  Antimicrobials this admission: cefepime  5/16 x 1 ceftriaxone  5/17 >>  vancomycin  5/16 >>  Microbiology results: 5/16 BCx: NGTD 5/18 Abscess wound cx: NGTD  Thank you for allowing pharmacy to be a part of this patient's care.  Will M. Alva Jewels, PharmD Clinical Pharmacist 05/31/2023 11:04 AM

## 2023-05-31 NOTE — Evaluation (Signed)
 Physical Therapy Evaluation Patient Details Name: Jason [name] MRN: [id number] DOB: 08-24-1945 Today's Date: 05/31/2023  History of Present Illness  Pt is a 78 y/o M admitted on 05/27/23 after presenting per podiatry recommendation for treatment of RLE cellulitis. Pt is s/p I&D R ankle abscess on 05/29/23. PMH: CAD, CVA, DM, HLD, HTN, PAD, s/p R TMA, L great toe amputation, LLE stents, tremor  Clinical Impression  Pt seen for PT evaluation with pt agreeable to tx. Pt reports he lives with his step daughter & her spouse, denies falls in the past 6 months, still drives & works at [employer]. On this date, pt is able to complete bed mobility with mod I, sit<>stand mod I, & ambulate without AD around nurses station with mod I. Pt reports he's walking at his baseline & does not feel the need for further PT services & this PT agrees. Pt would benefit from ongoing mobilization with mobility specialists while in acute setting. PT to complete current orders, please re-consult if new need arise.        If plan is discharge home, recommend the following:     Can travel by private vehicle        Equipment Recommendations None recommended by PT  Recommendations for Other Services       Functional Status Assessment Patient has not had a recent decline in their functional status     Precautions / Restrictions Precautions Precautions: Fall Restrictions Weight Bearing Restrictions Per Provider Order: No      Mobility  Bed Mobility Overal bed mobility: Modified Independent             General bed mobility comments: supine>sit to exit L side of bed    Transfers Overall transfer level: Needs assistance Equipment used: None Transfers: Sit to/from Stand Sit to Stand: Modified independent (Device/Increase time)                Ambulation/Gait Ambulation/Gait assistance: Modified independent (Device/Increase time) Gait Distance (Feet): 170 Feet Assistive device: None Gait  Pattern/deviations: Decreased step length - right, Decreased step length - left, Decreased stride length Gait velocity: decreased     General Gait Details: Pt reports he's walking close to his baseline, no overt LOB noted.  Stairs            Wheelchair Mobility     Tilt Bed    Modified Rankin (Stroke Patients Only)       Balance Overall balance assessment: Needs assistance Sitting-balance support: No upper extremity supported, Feet supported Sitting balance-Leahy Scale: Good Sitting balance - Comments: dons shoes sitting EOB without LOB   Standing balance support: During functional activity, No upper extremity supported Standing balance-Leahy Scale: Good                               Pertinent Vitals/Pain Pain Assessment Pain Assessment: No/denies pain    Home Living Family/patient expects to be discharged to:: Private residence Living Arrangements: Children Available Help at Discharge: Available PRN/intermittently Type of Home: House Home Access: Stairs to enter Entrance Stairs-Rails: None Entrance Stairs-Number of Steps: 1   Home Layout: Multi-level;Able to live on main level with bedroom/bathroom Home Equipment: Rolling Walker (2 wheels)      Prior Function Prior Level of Function : Independent/Modified Independent;Driving;Working/employed             Mobility Comments: denies falls in the past 6 months, works as a Audiological scientist at [employer]  Extremity/Trunk Assessment   Upper Extremity Assessment Upper Extremity Assessment: Overall WFL for tasks assessed    Lower Extremity Assessment Lower Extremity Assessment: Overall WFL for tasks assessed;RLE deficits/detail RLE Deficits / Details: hx of R transmet amputation       Communication   Communication Communication: No apparent difficulties    Cognition Arousal: Alert Behavior During Therapy: WFL for tasks assessed/performed   PT - Cognitive impairments: No apparent  impairments                         Following commands: Intact       Cueing Cueing Techniques: Verbal cues     General Comments      Exercises     Assessment/Plan    PT Assessment Patient does not need any further PT services  PT Problem List         PT Treatment Interventions      PT Goals (Current goals can be found in the Care Plan section)  Acute Rehab PT Goals Patient Stated Goal: get better, go home PT Goal Formulation: With patient Time For Goal Achievement: 06/14/23 Potential to Achieve Goals: Good    Frequency       Co-evaluation               AM-PAC PT "6 Clicks" Mobility  Outcome Measure Help needed turning from your back to your side while in a flat bed without using bedrails?: None Help needed moving from lying on your back to sitting on the side of a flat bed without using bedrails?: None Help needed moving to and from a bed to a chair (including a wheelchair)?: None Help needed standing up from a chair using your arms (e.g., wheelchair or bedside chair)?: None Help needed to walk in hospital room?: None Help needed climbing 3-5 steps with a railing? : None 6 Click Score: 24    End of Session   Activity Tolerance: Patient tolerated treatment well Patient left: in bed;with call bell/phone within reach Nurse Communication: Mobility status      Time: 0454-0981 PT Time Calculation (min) (ACUTE ONLY): 10 min   Charges:   PT Evaluation $PT Eval Low Complexity: 1 Low   PT General Charges $$ ACUTE PT VISIT: 1 Visit         Jason [name], PT, DPT 05/31/23, 11:01 AM   Jason [name] 05/31/2023, 10:57 AM

## 2023-05-31 NOTE — Plan of Care (Signed)
  Problem: Education: Goal: Ability to describe self-care measures that may prevent or decrease complications (Diabetes Survival Skills Education) will improve Outcome: Adequate for Discharge Goal: Individualized Educational Video(s) Outcome: Adequate for Discharge   Problem: Coping: Goal: Ability to adjust to condition or change in health will improve Outcome: Adequate for Discharge   Problem: Fluid Volume: Goal: Ability to maintain a balanced intake and output will improve Outcome: Adequate for Discharge   Problem: Health Behavior/Discharge Planning: Goal: Ability to identify and utilize available resources and services will improve Outcome: Adequate for Discharge Goal: Ability to manage health-related needs will improve Outcome: Adequate for Discharge   Problem: Metabolic: Goal: Ability to maintain appropriate glucose levels will improve Outcome: Adequate for Discharge   Problem: Nutritional: Goal: Maintenance of adequate nutrition will improve Outcome: Adequate for Discharge Goal: Progress toward achieving an optimal weight will improve Outcome: Adequate for Discharge   Problem: Skin Integrity: Goal: Risk for impaired skin integrity will decrease Outcome: Adequate for Discharge   Problem: Tissue Perfusion: Goal: Adequacy of tissue perfusion will improve Outcome: Adequate for Discharge   Problem: Education: Goal: Knowledge of General Education information will improve Description: Including pain rating scale, medication(s)/side effects and non-pharmacologic comfort measures Outcome: Adequate for Discharge   Problem: Health Behavior/Discharge Planning: Goal: Ability to manage health-related needs will improve Outcome: Adequate for Discharge   Problem: Clinical Measurements: Goal: Ability to maintain clinical measurements within normal limits will improve Outcome: Adequate for Discharge Goal: Will remain free from infection Outcome: Adequate for Discharge Goal:  Diagnostic test results will improve Outcome: Adequate for Discharge Goal: Respiratory complications will improve Outcome: Adequate for Discharge Goal: Cardiovascular complication will be avoided Outcome: Adequate for Discharge   Problem: Activity: Goal: Risk for activity intolerance will decrease Outcome: Adequate for Discharge   Problem: Nutrition: Goal: Adequate nutrition will be maintained Outcome: Adequate for Discharge   Problem: Coping: Goal: Level of anxiety will decrease Outcome: Adequate for Discharge   Problem: Elimination: Goal: Will not experience complications related to bowel motility Outcome: Adequate for Discharge Goal: Will not experience complications related to urinary retention Outcome: Adequate for Discharge   Problem: Pain Managment: Goal: General experience of comfort will improve and/or be controlled Outcome: Adequate for Discharge   Problem: Safety: Goal: Ability to remain free from injury will improve Outcome: Adequate for Discharge   Problem: Skin Integrity: Goal: Risk for impaired skin integrity will decrease Outcome: Adequate for Discharge   Problem: Clinical Measurements: Goal: Ability to avoid or minimize complications of infection will improve Outcome: Adequate for Discharge   Problem: Skin Integrity: Goal: Skin integrity will improve Outcome: Adequate for Discharge

## 2023-05-31 NOTE — Progress Notes (Signed)
 Assumed care at 1500.  Pt resting comfortably in room awaiting transport home.

## 2023-05-31 NOTE — TOC Transition Note (Signed)
 Transition of Care [facility]) - Discharge Note   Patient Details  Name: Jason [name] MRN: [id number] Date of Birth: March 20, 1945  Transition of Care [facility]) CM/SW Contact:  Crayton Docker, RN Phone Number: 05/31/2023, 4:46 PM   Clinical Narrative:     Discharge orders noted. [facility] following for home health RN services.   Final next level of care: Home w Home Health Services Barriers to Discharge: No Barriers Identified   Patient Goals and CMS Choice    Home with home health RN  Discharge Placement       Home with home health RN          Discharge Plan and Services Additional resources added to the After Visit Summary for      [facility] Agency: [facility]   Social Drivers of Health (SDOH) Interventions SDOH Screenings   Food Insecurity: No Food Insecurity (05/27/2023)  Housing: Low Risk  (05/27/2023)  Transportation Needs: No Transportation Needs (05/27/2023)  Utilities: Not At Risk (05/27/2023)  Financial Resource Strain: Medium Risk (12/08/2022)   Received from [facility]  Social Connections: Moderately Isolated (05/27/2023)  Tobacco Use: Low Risk  (05/27/2023)  Recent Concern: Tobacco Use - Medium Risk (05/27/2023)   Received from [facility]     Readmission Risk Interventions     No data to display

## 2023-05-31 NOTE — Discharge Summary (Addendum)
 Physician Discharge Summary   Patient: [name] MRN: [id number] DOB: 08/04/1945  Admit date:     05/27/2023  Discharge date: 05/31/23  Discharge Physician: Melvinia Stager   PCP: Lorrie Rothman, MD   Recommendations at discharge:   follow-up Dr. Althea Atkinson in one week dressing changes per Dr. Althea Atkinson recommendation follow-up PCP in 1 to 2 weeks  Discharge Diagnoses: Principal Problem:   Cellulitis of right lower extremity Active Problems:   PVD (peripheral vascular disease) (HCC)   Uncontrolled type 2 diabetes mellitus with hyperglycemia, with long-term current use of insulin  (HCC)   Essential hypertension   History of CVA (cerebrovascular accident)   Cellulitis   CAD (coronary artery disease)   Jason [name] is a 78 y.o. male with medical history significant for CAD, CVA, DM, HLD, HTN, PAD s/p right TMA, left great toe amputation, stents left leg   sent in by podiatry for admission for treatment of cellulitis of right lower extremity.  Patient presented with 2-week history of redness extending from the stump up to mid calf associated with pain and swelling.  He has no fever or chills. He was also noted to have an additional area of swelling on the right lower leg that was aspirated in the Podiatry office without any return.    Xray Tib/Fib and Foot x-ray shows no acute bony abnormality    Cellulitis of right lower extremity and Plantar wound right foot --Rocephin  and vancomycin --change to oral abxs in am. Seen by podiatry--overall improving with IV abxs. Cont IV abxs  --prn Pain control --Keep leg elevated --Podiatry consult noted by Dr Althea Atkinson -- blood culture negative --MRI Tib/Fib right ext 1. Within the distal lateral calf, centered approximately 9 cm proximal from the distal fibula, there is a mildly complex crescentic walled-off collection suspicious for an abscess given the clinical history, measuring up to approximately 0.6 cm in transverse dimension, 5.9 cm in AP  dimension, and 5.5 cm in craniocaudal length. This closely apposes the superficial/lateral fascial plane of the peroneus longus and brevis muscles. It does not appear to infiltrate the muscle. 2. No cortical erosion or marrow edema is seen to indicate MRI evidence of acute osteomyelitis. 3. There is fatty infiltration, atrophy, and edema seen throughout the calf musculature. 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 --5/18--s/p  I and D of right ankle abscess by Dr Althea Atkinson --5/19-- Per Dr Larey Plenty "still angry looking" cont IV abxs --WC no growth so far --5/20--right ankle improving--ok to d/c per Dr Althea Atkinson on po Doxy. f/u I 1 week   Uncontrolled type 2 diabetes mellitus with hyperglycemia, with long-term current use of insulin  (HCC) --Continue basal insulin  adjust dose since sugars high --Sliding scale coverage --resume po metformin  and glimipride   PVD (peripheral vascular disease) (HCC) History of right TMA, left great toe amputation and stent angioplasty left leg --Continue aspirin , plavix  and rosuvastatin    Essential hypertension --Continue home propranolol  and ramipril    History of CVA (cerebrovascular accident) --Continue aspirin ,plavix  and rosuvastatin    CAD (coronary artery disease) --stable         Procedures: I and Right ankle abscess Family communication :none Consults :Podiatry CODE STATUS: full DVT Prophylaxis :lovenox      Pain control - [place]  Controlled Substance Reporting System database was reviewed. and patient was instructed, not to drive, operate heavy machinery, perform activities at heights, swimming or participation in water activities or provide baby-sitting services while on Pain, Sleep and Anxiety Medications; until their outpatient Physician has  advised to do so again. Also recommended to not to take more than prescribed Pain, Sleep and Anxiety Medications.  Disposition: Home Diet recommendation:  Discharge Diet Orders (From  admission, onward)     Start     Ordered   05/31/23 0000  Diet - low sodium heart healthy        05/31/23 1251           Cardiac and Carb modified diet DISCHARGE MEDICATION: Allergies as of 05/31/2023   No Known Allergies      Medication List     STOP taking these medications    amLODipine  10 MG tablet Commonly known as: NORVASC        TAKE these medications    aspirin  81 MG tablet Take 81 mg by mouth daily.   calcium  carbonate 1500 (600 Ca) MG Tabs tablet Commonly known as: OSCAL Take 600 mg of elemental calcium  by mouth daily.   clopidogrel  75 MG tablet Commonly known as: PLAVIX  Take 75 mg by mouth daily.   doxycycline  100 MG tablet Commonly known as: VIBRA -TABS Take 1 tablet (100 mg total) by mouth every 12 (twelve) hours for 6 days.   FreeStyle Libre 14 Day Reader Seymour Dapper 1 Device.   FreeStyle Libre 14 Day Sensor Misc SMARTSIG:1 Kit(s) Topical Every 2 Weeks   glimepiride  4 MG tablet Commonly known as: AMARYL  Take 4 mg by mouth daily with breakfast.   HYDROcodone -acetaminophen  5-325 MG tablet Commonly known as: NORCO/VICODIN Take 1 tablet by mouth every 8 (eight) hours as needed for moderate pain (pain score 4-6) or severe pain (pain score 7-10).   insulin  glargine 100 UNIT/ML injection Commonly known as: LANTUS  Inject 25 Units into the skin at bedtime.   insulin  regular 100 units/mL injection Commonly known as: NOVOLIN R Inject 3-18 Units into the skin 3 (three) times daily before meals. Below 100=3u, 100-199=6u, 200-250=9u, 251-300=12u, 301-350=15u, 351 and above=18u.   Magnesium  250 MG Tabs Take 1 tablet by mouth daily.   metFORMIN  1000 MG tablet Commonly known as: GLUCOPHAGE  Take 1,000 mg by mouth 2 (two) times daily with a meal.   multivitamin capsule Take 1 capsule by mouth daily.   Potassium 99 MG Tabs Take 1 tablet by mouth daily.   propranolol  ER 60 MG 24 hr capsule Commonly known as: INDERAL  LA Take 60 mg by mouth daily.    ramipril  10 MG capsule Commonly known as: ALTACE  Take 10 mg by mouth daily.   rosuvastatin  20 MG tablet Commonly known as: CRESTOR  Take 20 mg by mouth daily.               Discharge Care Instructions  (From admission, onward)           Start     Ordered   05/31/23 0000  Discharge wound care:       Comments: Paint area with Betadine  and cover with gauze dressing daily   05/31/23 1251            Follow-up Information     Anell Baptist, DPM. Go on 06/22/2023.   Specialty: Podiatry Why: Appt @ 10:15 am (Office will put patient on list for earlier appt date) Contact information: [address] [facility] [place] [address] [phone]         Lorrie Rothman, MD. Go on 06/03/2023.   Specialty: Family Medicine Why: Appt @ 8:30 am Contact information: [address] [facility] [place] [address] [phone]  Discharge Exam: Filed Weights   05/27/23 1531  Weight: 97.5 kg  On 05/31/2023   Condition at discharge: fair  The results of significant diagnostics from this hospitalization (including imaging, microbiology, ancillary and laboratory) are listed below for reference.   Imaging Studies: MR TIBIA FIBULA RIGHT WO CONTRAST Result Date: 05/29/2023 CLINICAL DATA:  Soft tissue infection suspected, lower leg. History of diabetes. Clinical concern for abscess of the distal lateral calf at the level of the distal fibula. EXAM: MRI OF LOWER RIGHT EXTREMITY WITHOUT CONTRAST TECHNIQUE: Multiplanar, multisequence MR imaging of the right tibia and fibula was performed. No intravenous contrast was administered. COMPARISON:  right tibia and fibula radiographs 05/28/2023 FINDINGS: Bones/Joint/Cartilage There is chronic cystic change inferior to the right knee medial tibial spine measuring up to 1.4 cm in craniocaudal dimension. On coronal images that include the left lower extremity,  similar cystic changes deep to the tibial spines measuring up to approximately 3.0 cm in craniocaudal dimension. Minimal a marrow edema within the anteromedial aspect of the proximal tibial diaphysis (axial series 13, image 18). The overlying cortex appears intact and this may be stress related/reactive. No cortical erosion or marrow edema is seen to indicate MRI evidence of acute osteomyelitis. Ligaments No ligament tear is seen. Muscles and Tendons There is high-grade fatty infiltration of multiple muscles in the calf, greatest within the medial head of the gastrocnemius, soleus, posterior tibial, and tibialis anterior muscles. There is moderate patchy edema seen throughout the calf musculature, greatest within the tibialis anterior, extensor digitorum longus, flexor hallucis longus, and peroneus longus and brevis muscles. Soft tissues Within the proximal lateral calf subcutaneous fat there is edema and swelling measuring up to 7 mm in transverse thickness (axial series 13, image 13). This subcutaneous fat swelling involves an approximately 13.5 cm craniocaudal length of the proximal calf from the proximal fibula distally (coronal series 7 image 11). Within the distal lateral calf, centered approximately 9 cm proximal from the distal fibula, there is a walled-off increased T2 and decreased T1 signal mildly complex crescentic collection suspicious for an abscess given the clinical history, measuring up to approximately 0.6 cm in transverse dimension, 5.9 cm in AP dimension, and 5.5 cm in craniocaudal length. This closely apposes the superficial/lateral fascial plane of the peroneus longus and brevis muscles. It does not appear to infiltrate the muscle. IMPRESSION: 1. Within the distal lateral calf, centered approximately 9 cm proximal from the distal fibula, there is a mildly complex crescentic walled-off collection suspicious for an abscess given the clinical history, measuring up to approximately 0.6 cm in  transverse dimension, 5.9 cm in AP dimension, and 5.5 cm in craniocaudal length. This closely apposes the superficial/lateral fascial plane of the peroneus longus and brevis muscles. It does not appear to infiltrate the muscle. 2. No cortical erosion or marrow edema is seen to indicate MRI evidence of acute osteomyelitis. 3. There is fatty infiltration, atrophy, and edema seen throughout the calf musculature. 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 Findings discussed with the patient's podiatrist, Dr. Althea Atkinson, at 2:50 p.m. on 05/29/2023. Electronically Signed   By: Bertina Broccoli M.D.   On: 05/29/2023 15:00   DG Tibia/Fibula Right Result Date: 05/28/2023 CLINICAL DATA:  Cellulitis. EXAM: RIGHT TIBIA AND FIBULA - 2 VIEW COMPARISON:  None Available. FINDINGS: There is no evidence of fracture or other focal bone lesions. Soft tissues are unremarkable. IMPRESSION: Negative. Electronically Signed   By: Rosalene Colon M.D.   On: 05/28/2023 12:41  DG Foot 2 Views Right Result Date: 05/27/2023 CLINICAL DATA:  Right foot swelling, redness EXAM: RIGHT FOOT - 2 VIEW COMPARISON:  04/02/2020 FINDINGS: Prior transmetatarsal amputation of all the toes in the right foot. Advanced degenerative changes throughout the midfoot and hindfoot. Plantar calcaneal spur. No bone destruction to suggest osteomyelitis. No fracture. There appears to be mild diffuse soft tissue swelling. No soft tissue gas or radiopaque foreign body. IMPRESSION: No acute bony abnormality. Electronically Signed   By: Janeece Mechanic M.D.   On: 05/27/2023 19:40    Microbiology: Results for orders placed or performed during the hospital encounter of 05/27/23  Culture, blood (routine x 2)     Status: None (Preliminary result)   Collection Time: 05/27/23  7:24 PM   Specimen: BLOOD  Result Value Ref Range Status   Specimen Description BLOOD BLOOD LEFT ARM  Final   Special Requests   Final    BOTTLES DRAWN AEROBIC AND ANAEROBIC Blood  Culture results may not be optimal due to an inadequate volume of blood received in culture bottles   Culture   Final    NO GROWTH 4 DAYS Performed at [facility], [address], [place], [place] [address]    Report Status PENDING  Incomplete  Culture, blood (routine x 2)     Status: None (Preliminary result)   Collection Time: 05/27/23  7:24 PM   Specimen: BLOOD  Result Value Ref Range Status   Specimen Description BLOOD BLOOD RIGHT ARM  Final   Special Requests   Final    BOTTLES DRAWN AEROBIC AND ANAEROBIC Blood Culture results may not be optimal due to an inadequate volume of blood received in culture bottles   Culture   Final    NO GROWTH 4 DAYS Performed at [facility], [address] [place], [place] [address]    Report Status PENDING  Incomplete  Aerobic/Anaerobic Culture w Gram Stain (surgical/deep wound)     Status: None (Preliminary result)   Collection Time: 05/29/23  4:49 PM   Specimen: Abscess  Result Value Ref Range Status   Specimen Description   Final    ABSCESS Performed at [facility], [address], [place], [place] [address]    Special Requests   Final    NONE Performed at [facility], [address] [place], [place] [address]    Gram Stain   Final    RARE WBC PRESENT, PREDOMINANTLY PMN NO ORGANISMS SEEN    Culture   Final    RARE STREPTOCOCCUS GROUP C Beta hemolytic streptococci are predictably susceptible to penicillin and other beta lactams. Susceptibility testing not routinely performed. Performed at [facility], [address] [place], [place] [address]    Report Status PENDING  Incomplete    Labs: CBC: Recent Labs  Lab 05/27/23 1529 05/28/23 0401  WBC 6.8 5.6  HGB 11.7* 11.1*  HCT 35.4* 32.8*  MCV 87.2 85.9  PLT 260 225   Basic Metabolic Panel: Recent Labs  Lab 05/27/23 1529 05/28/23 0401 05/30/23 0850  NA 136 137 135  K 3.8 3.5 3.9  CL 101 105 104  CO2 25 24 25   GLUCOSE  252* 201* 197*  BUN 20 15 19   CREATININE 1.08 0.79 0.74  CALCIUM  8.9 8.6* 8.8*   CBG: Recent Labs  Lab 05/30/23 1153 05/30/23 1655 05/30/23 2057 05/31/23 0750 05/31/23 1147  GLUCAP 196* 146* 173* 146* 168*    Discharge time spent: greater than 30 minutes.  Signed: Melvinia Stager, MD [facility] 05/31/2023

## 2023-05-31 NOTE — Progress Notes (Signed)
 Daily Progress Note   Subjective  - 2 Days Post-Op  Follow-up I&D abscess right lower leg.  No complaints today.  Objective Vitals:   05/30/23 1554 05/30/23 2011 05/31/23 0431 05/31/23 0749  BP: (!) 180/69 (!) 154/65 (!) 169/68 (!) 163/69  Pulse: 62 64 (!) 59 61  Resp: 16 19 17 17   Temp: 97.9 F (36.6 C) 98.4 F (36.9 C) 98.1 F (36.7 C) 97.9 F (36.6 C)  TempSrc: Oral Oral Oral Oral  SpO2: 96% 95% 94% 96%  Weight:      Height:        Physical Exam: Incision is healing nicely.  No active purulent drainage.  Erythema continues to dissipate.  Plantar right foot ulceration is stable.  Minimal fibrotic tissue.  Good healthy granular tissue at this time.  Culture is still pending.  No organisms seen on day 2.   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  There is no active purulent drainage at this time from the incision site. Dressing changed today.  Instructed patient on daily dressing changes.  Paint area with Betadine  and cover with gauze dressing.  Patient feels comfortable to perform this. Agree with p.o. antibiotics.  Doxycycline  should suffice for the next 10 days. Follow-up with me in 1 week. Patient stable for discharge from podiatry standpoint.   Anell Baptist A  05/31/2023, 12:41 PM

## 2023-06-01 LAB — CULTURE, BLOOD (ROUTINE X 2)
Culture: NO GROWTH
Culture: NO GROWTH

## 2023-06-03 DIAGNOSIS — Z8673 Personal history of transient ischemic attack (TIA), and cerebral infarction without residual deficits: Secondary | ICD-10-CM | POA: Diagnosis not present

## 2023-06-03 DIAGNOSIS — E11621 Type 2 diabetes mellitus with foot ulcer: Secondary | ICD-10-CM | POA: Diagnosis not present

## 2023-06-03 DIAGNOSIS — Z89422 Acquired absence of other left toe(s): Secondary | ICD-10-CM | POA: Diagnosis not present

## 2023-06-03 DIAGNOSIS — L97919 Non-pressure chronic ulcer of unspecified part of right lower leg with unspecified severity: Secondary | ICD-10-CM | POA: Diagnosis not present

## 2023-06-03 DIAGNOSIS — Z89412 Acquired absence of left great toe: Secondary | ICD-10-CM | POA: Diagnosis not present

## 2023-06-03 DIAGNOSIS — E7800 Pure hypercholesterolemia, unspecified: Secondary | ICD-10-CM | POA: Diagnosis not present

## 2023-06-03 DIAGNOSIS — E11622 Type 2 diabetes mellitus with other skin ulcer: Secondary | ICD-10-CM | POA: Diagnosis not present

## 2023-06-03 DIAGNOSIS — Z0000 Encounter for general adult medical examination without abnormal findings: Secondary | ICD-10-CM | POA: Diagnosis not present

## 2023-06-03 DIAGNOSIS — E1151 Type 2 diabetes mellitus with diabetic peripheral angiopathy without gangrene: Secondary | ICD-10-CM | POA: Diagnosis not present

## 2023-06-03 DIAGNOSIS — L03115 Cellulitis of right lower limb: Secondary | ICD-10-CM | POA: Diagnosis not present

## 2023-06-03 DIAGNOSIS — Z89431 Acquired absence of right foot: Secondary | ICD-10-CM | POA: Diagnosis not present

## 2023-06-03 LAB — AEROBIC/ANAEROBIC CULTURE W GRAM STAIN (SURGICAL/DEEP WOUND)

## 2023-06-05 DIAGNOSIS — E782 Mixed hyperlipidemia: Secondary | ICD-10-CM | POA: Diagnosis not present

## 2023-06-05 DIAGNOSIS — E1165 Type 2 diabetes mellitus with hyperglycemia: Secondary | ICD-10-CM | POA: Diagnosis not present

## 2023-06-05 DIAGNOSIS — L97512 Non-pressure chronic ulcer of other part of right foot with fat layer exposed: Secondary | ICD-10-CM | POA: Diagnosis not present

## 2023-06-05 DIAGNOSIS — I10 Essential (primary) hypertension: Secondary | ICD-10-CM | POA: Diagnosis not present

## 2023-06-05 DIAGNOSIS — E11621 Type 2 diabetes mellitus with foot ulcer: Secondary | ICD-10-CM | POA: Diagnosis not present

## 2023-06-05 DIAGNOSIS — I70222 Atherosclerosis of native arteries of extremities with rest pain, left leg: Secondary | ICD-10-CM | POA: Diagnosis not present

## 2023-06-05 DIAGNOSIS — I2510 Atherosclerotic heart disease of native coronary artery without angina pectoris: Secondary | ICD-10-CM | POA: Diagnosis not present

## 2023-06-05 DIAGNOSIS — L03115 Cellulitis of right lower limb: Secondary | ICD-10-CM | POA: Diagnosis not present

## 2023-06-05 DIAGNOSIS — E1151 Type 2 diabetes mellitus with diabetic peripheral angiopathy without gangrene: Secondary | ICD-10-CM | POA: Diagnosis not present

## 2023-06-07 DIAGNOSIS — E1151 Type 2 diabetes mellitus with diabetic peripheral angiopathy without gangrene: Secondary | ICD-10-CM | POA: Diagnosis not present

## 2023-06-07 DIAGNOSIS — E1165 Type 2 diabetes mellitus with hyperglycemia: Secondary | ICD-10-CM | POA: Diagnosis not present

## 2023-06-07 DIAGNOSIS — I10 Essential (primary) hypertension: Secondary | ICD-10-CM | POA: Diagnosis not present

## 2023-06-07 DIAGNOSIS — L03115 Cellulitis of right lower limb: Secondary | ICD-10-CM | POA: Diagnosis not present

## 2023-06-07 DIAGNOSIS — I70222 Atherosclerosis of native arteries of extremities with rest pain, left leg: Secondary | ICD-10-CM | POA: Diagnosis not present

## 2023-06-07 DIAGNOSIS — E782 Mixed hyperlipidemia: Secondary | ICD-10-CM | POA: Diagnosis not present

## 2023-06-07 DIAGNOSIS — E11621 Type 2 diabetes mellitus with foot ulcer: Secondary | ICD-10-CM | POA: Diagnosis not present

## 2023-06-07 DIAGNOSIS — L97512 Non-pressure chronic ulcer of other part of right foot with fat layer exposed: Secondary | ICD-10-CM | POA: Diagnosis not present

## 2023-06-07 DIAGNOSIS — I2510 Atherosclerotic heart disease of native coronary artery without angina pectoris: Secondary | ICD-10-CM | POA: Diagnosis not present

## 2023-06-14 DIAGNOSIS — I70222 Atherosclerosis of native arteries of extremities with rest pain, left leg: Secondary | ICD-10-CM | POA: Diagnosis not present

## 2023-06-14 DIAGNOSIS — L97512 Non-pressure chronic ulcer of other part of right foot with fat layer exposed: Secondary | ICD-10-CM | POA: Diagnosis not present

## 2023-06-14 DIAGNOSIS — E1151 Type 2 diabetes mellitus with diabetic peripheral angiopathy without gangrene: Secondary | ICD-10-CM | POA: Diagnosis not present

## 2023-06-14 DIAGNOSIS — E782 Mixed hyperlipidemia: Secondary | ICD-10-CM | POA: Diagnosis not present

## 2023-06-14 DIAGNOSIS — E1165 Type 2 diabetes mellitus with hyperglycemia: Secondary | ICD-10-CM | POA: Diagnosis not present

## 2023-06-14 DIAGNOSIS — I10 Essential (primary) hypertension: Secondary | ICD-10-CM | POA: Diagnosis not present

## 2023-06-14 DIAGNOSIS — E11621 Type 2 diabetes mellitus with foot ulcer: Secondary | ICD-10-CM | POA: Diagnosis not present

## 2023-06-14 DIAGNOSIS — I2510 Atherosclerotic heart disease of native coronary artery without angina pectoris: Secondary | ICD-10-CM | POA: Diagnosis not present

## 2023-06-14 DIAGNOSIS — L03115 Cellulitis of right lower limb: Secondary | ICD-10-CM | POA: Diagnosis not present

## 2023-06-15 DIAGNOSIS — E782 Mixed hyperlipidemia: Secondary | ICD-10-CM | POA: Diagnosis not present

## 2023-06-15 DIAGNOSIS — I2510 Atherosclerotic heart disease of native coronary artery without angina pectoris: Secondary | ICD-10-CM | POA: Diagnosis not present

## 2023-06-15 DIAGNOSIS — E11621 Type 2 diabetes mellitus with foot ulcer: Secondary | ICD-10-CM | POA: Diagnosis not present

## 2023-06-15 DIAGNOSIS — I70222 Atherosclerosis of native arteries of extremities with rest pain, left leg: Secondary | ICD-10-CM | POA: Diagnosis not present

## 2023-06-15 DIAGNOSIS — I10 Essential (primary) hypertension: Secondary | ICD-10-CM | POA: Diagnosis not present

## 2023-06-15 DIAGNOSIS — L97512 Non-pressure chronic ulcer of other part of right foot with fat layer exposed: Secondary | ICD-10-CM | POA: Diagnosis not present

## 2023-06-15 DIAGNOSIS — L03115 Cellulitis of right lower limb: Secondary | ICD-10-CM | POA: Diagnosis not present

## 2023-06-15 DIAGNOSIS — E1151 Type 2 diabetes mellitus with diabetic peripheral angiopathy without gangrene: Secondary | ICD-10-CM | POA: Diagnosis not present

## 2023-06-15 DIAGNOSIS — E1165 Type 2 diabetes mellitus with hyperglycemia: Secondary | ICD-10-CM | POA: Diagnosis not present

## 2023-06-17 DIAGNOSIS — E1165 Type 2 diabetes mellitus with hyperglycemia: Secondary | ICD-10-CM | POA: Diagnosis not present

## 2023-06-17 DIAGNOSIS — I70222 Atherosclerosis of native arteries of extremities with rest pain, left leg: Secondary | ICD-10-CM | POA: Diagnosis not present

## 2023-06-17 DIAGNOSIS — I2510 Atherosclerotic heart disease of native coronary artery without angina pectoris: Secondary | ICD-10-CM | POA: Diagnosis not present

## 2023-06-17 DIAGNOSIS — E11621 Type 2 diabetes mellitus with foot ulcer: Secondary | ICD-10-CM | POA: Diagnosis not present

## 2023-06-17 DIAGNOSIS — E782 Mixed hyperlipidemia: Secondary | ICD-10-CM | POA: Diagnosis not present

## 2023-06-17 DIAGNOSIS — L03115 Cellulitis of right lower limb: Secondary | ICD-10-CM | POA: Diagnosis not present

## 2023-06-17 DIAGNOSIS — E1151 Type 2 diabetes mellitus with diabetic peripheral angiopathy without gangrene: Secondary | ICD-10-CM | POA: Diagnosis not present

## 2023-06-17 DIAGNOSIS — L97512 Non-pressure chronic ulcer of other part of right foot with fat layer exposed: Secondary | ICD-10-CM | POA: Diagnosis not present

## 2023-06-17 DIAGNOSIS — I10 Essential (primary) hypertension: Secondary | ICD-10-CM | POA: Diagnosis not present

## 2023-06-20 DIAGNOSIS — I70222 Atherosclerosis of native arteries of extremities with rest pain, left leg: Secondary | ICD-10-CM | POA: Diagnosis not present

## 2023-06-20 DIAGNOSIS — E1151 Type 2 diabetes mellitus with diabetic peripheral angiopathy without gangrene: Secondary | ICD-10-CM | POA: Diagnosis not present

## 2023-06-20 DIAGNOSIS — I2510 Atherosclerotic heart disease of native coronary artery without angina pectoris: Secondary | ICD-10-CM | POA: Diagnosis not present

## 2023-06-20 DIAGNOSIS — E1165 Type 2 diabetes mellitus with hyperglycemia: Secondary | ICD-10-CM | POA: Diagnosis not present

## 2023-06-20 DIAGNOSIS — L03115 Cellulitis of right lower limb: Secondary | ICD-10-CM | POA: Diagnosis not present

## 2023-06-20 DIAGNOSIS — L97512 Non-pressure chronic ulcer of other part of right foot with fat layer exposed: Secondary | ICD-10-CM | POA: Diagnosis not present

## 2023-06-20 DIAGNOSIS — E11621 Type 2 diabetes mellitus with foot ulcer: Secondary | ICD-10-CM | POA: Diagnosis not present

## 2023-06-20 DIAGNOSIS — E782 Mixed hyperlipidemia: Secondary | ICD-10-CM | POA: Diagnosis not present

## 2023-06-20 DIAGNOSIS — I10 Essential (primary) hypertension: Secondary | ICD-10-CM | POA: Diagnosis not present

## 2023-06-22 DIAGNOSIS — L97512 Non-pressure chronic ulcer of other part of right foot with fat layer exposed: Secondary | ICD-10-CM | POA: Diagnosis not present

## 2023-06-22 DIAGNOSIS — E1142 Type 2 diabetes mellitus with diabetic polyneuropathy: Secondary | ICD-10-CM | POA: Diagnosis not present

## 2023-06-22 NOTE — Progress Notes (Signed)
 Chief Complaint:   Chief Complaint  Patient presents with  . Follow-up    Right foot I&D    Subjective  HPI  [name] is a 78 y.o. male who presents for Follow-up (Right foot I&D) History of Present Illness Jason [name] is a 78 year old male who presents for wound care management of a foot ulcer.  He is also status post I&D of abscess to the lateral aspect of the right lower leg performed by myself about 3 weeks ago.  He still has the sutures intact at this time.  He has a foot ulcer on the bottom of his foot measuring 1.5 by 1.1 centimeters. The ulcer is larger than previous ones and persists due to pressure from standing. He recalls being out of work for four months previously due to a similar issue. The ulcer is stable but remains open.  He is using a dressing, possibly calcium  alginate, which he believes has been helpful. A nurse visits three times a week to change the bandage. He is trying to stay off his foot and walks on his heel to avoid pressure on the ulcer. Previous ulcers were located on his heel.  He is concerned about the standing required by his job, which could worsen the ulcer. He is awaiting approval for medical leave and is in contact with the company responsible for this process. He is concerned about the duration of his leave and the need for documentation to extend it.  Review of Systems  Patient Active Problem List  Diagnosis  . DM (diabetes mellitus), type 2, uncontrolled, periph vascular complic  . Type 2 diabetes mellitus with hyperglycemia, with long-term current use of insulin  (CMS/HHS-HCC)  . Essential hypertension  . Hyperlipidemia, mixed  . CAD (coronary artery disease)  . 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 History of CVA (cerebrovascular accident)  . PVD (peripheral vascular disease) ()  . Foot osteomyelitis, left (CMS/HHS-HCC)  . Cellulitis of right lower extremity    Outpatient Medications  Prior to Visit  Medication Sig Dispense Refill  . aspirin  81 MG EC tablet Take 81 mg by mouth once daily    . blood glucose diagnostic test strip Use 1 each once daily Use as instructed.    . clopidogreL  (PLAVIX ) 75 mg tablet Take 1 tablet by mouth once daily 90 tablet 1  . FREESTYLE LIBRE 14 DAY reader Use 1 Device as directed 1 each 1  . FREESTYLE LIBRE 14 DAY SENSOR kit Inject 1 kit subcutaneously every 14 (fourteen) days 6 kit 4  . glimepiride  (AMARYL ) 4 MG tablet take 1 tablet by mouth in the morning 90 tablet 3  . LANCETS (FREESTYLE UNISTIK 2 MISC) Use 1 each 2 (two) times daily as needed.    . LANTUS  SOLOSTAR U-100 INSULIN  pen injector (concentration 100 units/mL) INJECT 33 UNITS SUBCUTANEOUSLY AT BEDTIME (Patient taking differently: 25 units at bedtime) 15 mL 4  . magnesium  250 mg Tab Take by mouth    . metFORMIN  (GLUCOPHAGE ) 1000 MG tablet Take 1 tablet by mouth twice daily 180 tablet 4  . MULTIVITAMIN ORAL Take 1 tablet by mouth once daily.    SABRA NOVOLIN R 100 unit/mL injection INJECT 5 UNITS SUBCUTANEOUSLY EVERY EVENING WITH EVENING MEAL (Patient taking differently: Sliding scale starting with 2 units if blood sugar is 150-200) 10 mL 6  . pen needle, diabetic 32 gauge x 3/16 Ndle Use 1 Units once daily 100 each 4  . POTASSIUM CHLORIDE   ORAL Take 1 tablet by mouth once daily       . propranoloL  (INDERAL  LA) 60 MG LA capsule Take 1 capsule by mouth once daily 90 capsule 3  . ramipriL  (ALTACE ) 10 MG capsule Take 1 capsule by mouth once daily 90 capsule 3  . rosuvastatin  (CRESTOR ) 20 MG tablet Take 1 tablet by mouth once daily 90 tablet 1   No facility-administered medications prior to visit.      Objective  Vitals:   06/22/23 1013  BP: 131/81  Weight: 92.5 kg (204 lb)  Height: 175.3 cm (5' 9)  PainSc: 0-No pain   Body mass index is 30.13 kg/m.  Home Vitals:     Physical Exam Physical Exam SKIN: Ulcer on foot, 1.5 x 1.1 cm, open.  Plantar foot ulceration is noted.  No  acute infection.  Granular tissue noted with some surrounding nonviable tissue.  This was debrided back today as described below  Lateral right lower leg at the I&D areas basically healed over.  Sutures were removed.  Large scab material was removed.  Patient is completely neuropathic.       The following labs were personally reviewed: - Lab Results  Component Value Date   WBC 7.1 06/03/2023   HGB 12.3 (L) 06/03/2023   HCT 36.4 (L) 06/03/2023   PLT 277 06/03/2023   - Lab Results  Component Value Date   NA 137 06/03/2023   K 4.5 06/03/2023   CL 102 06/03/2023   CO2 29.0 06/03/2023   BUN 17 06/03/2023   CREATININE 0.8 06/03/2023   GLUCOSE 219 (H) 06/03/2023   - Lab Results  Component Value Date   NA 137 06/03/2023   K 4.5 06/03/2023   CL 102 06/03/2023   CO2 29.0 06/03/2023   BUN 17 06/03/2023   CREATININE 0.8 06/03/2023   CALCIUM  10.0 06/03/2023   TP 7.0 06/05/2006   ALB 3.7 06/03/2023   TBILI 0.6 06/03/2023   ALKPHOS 106 (H) 06/03/2023   AST 31 06/03/2023   ALT 37 06/03/2023   GLUCOSE 219 (H) 06/03/2023   GFR 91 06/03/2023   - Lab Results  Component Value Date   HGBA1C 9.5 (H) 05/20/2023      Results DIAGNOSTIC Foot ulcer measurement: 1.5 cm x 1.1 cm (06/22/2023)     Assessment/Plan:   Assessment & Plan Foot ulcer 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 The ulcer remains open due to pressure from standing. Previous ulcers were located on the heel, and the current ulcer is larger. Surgical intervention to shave bone may be necessary to relieve pressure and promote healing. - Continue dressing changes by the nurse three times a week. - Determine and communicate the specific dressing used to the provider. - Advise minimizing pressure on the ulcer by avoiding standing and walking on the heel. - Consider surgical intervention to shave bone if necessary. - Follow up in two weeks for reassessment.  Abscess right lower  leg - Status post I&D.  Sutures removed.  Just continue to closely monitor.  This looks to be healed nicely.  No signs of residual infection.    Diagnoses and all orders for this visit:  Cellulitis of right lower extremity  Type 2 diabetes mellitus with polyneuropathy (CMS/HHS-HCC)  Ulcer of right foot with fat layer exposed (CMS/HHS-HCC)  S/P transmetatarsal amputation of foot, right (CMS/HHS-HCC)          Future Appointments     Date/Time Provider Department Center Visit  Type   07/12/2023 9:00 AM Baker, Prentice Sharper, DPM [facility] PODIATRY RETURN   10/07/2023 9:15 AM Cherilyn Debby Quivers, MD [facility] RETURN VISIT   12/16/2023 10:00 AM Feldpausch, Cheryl Therman Raddle., MD [facility] [place] [facility] OFFICE VISIT       There are no Patient Instructions on file for this visit.   This note has been created using automated tools and reviewed for accuracy by JUSTIN ALLEN FOWLER.

## 2023-06-24 DIAGNOSIS — I70222 Atherosclerosis of native arteries of extremities with rest pain, left leg: Secondary | ICD-10-CM | POA: Diagnosis not present

## 2023-06-24 DIAGNOSIS — I10 Essential (primary) hypertension: Secondary | ICD-10-CM | POA: Diagnosis not present

## 2023-06-24 DIAGNOSIS — E782 Mixed hyperlipidemia: Secondary | ICD-10-CM | POA: Diagnosis not present

## 2023-06-24 DIAGNOSIS — L03115 Cellulitis of right lower limb: Secondary | ICD-10-CM | POA: Diagnosis not present

## 2023-06-24 DIAGNOSIS — L97512 Non-pressure chronic ulcer of other part of right foot with fat layer exposed: Secondary | ICD-10-CM | POA: Diagnosis not present

## 2023-06-24 DIAGNOSIS — E11621 Type 2 diabetes mellitus with foot ulcer: Secondary | ICD-10-CM | POA: Diagnosis not present

## 2023-06-24 DIAGNOSIS — E1165 Type 2 diabetes mellitus with hyperglycemia: Secondary | ICD-10-CM | POA: Diagnosis not present

## 2023-06-24 DIAGNOSIS — I2510 Atherosclerotic heart disease of native coronary artery without angina pectoris: Secondary | ICD-10-CM | POA: Diagnosis not present

## 2023-06-24 DIAGNOSIS — E1151 Type 2 diabetes mellitus with diabetic peripheral angiopathy without gangrene: Secondary | ICD-10-CM | POA: Diagnosis not present

## 2023-06-27 DIAGNOSIS — E1165 Type 2 diabetes mellitus with hyperglycemia: Secondary | ICD-10-CM | POA: Diagnosis not present

## 2023-06-27 DIAGNOSIS — L97512 Non-pressure chronic ulcer of other part of right foot with fat layer exposed: Secondary | ICD-10-CM | POA: Diagnosis not present

## 2023-06-27 DIAGNOSIS — I2510 Atherosclerotic heart disease of native coronary artery without angina pectoris: Secondary | ICD-10-CM | POA: Diagnosis not present

## 2023-06-27 DIAGNOSIS — L03115 Cellulitis of right lower limb: Secondary | ICD-10-CM | POA: Diagnosis not present

## 2023-06-27 DIAGNOSIS — E1151 Type 2 diabetes mellitus with diabetic peripheral angiopathy without gangrene: Secondary | ICD-10-CM | POA: Diagnosis not present

## 2023-06-27 DIAGNOSIS — E11621 Type 2 diabetes mellitus with foot ulcer: Secondary | ICD-10-CM | POA: Diagnosis not present

## 2023-06-27 DIAGNOSIS — I10 Essential (primary) hypertension: Secondary | ICD-10-CM | POA: Diagnosis not present

## 2023-06-27 DIAGNOSIS — I70222 Atherosclerosis of native arteries of extremities with rest pain, left leg: Secondary | ICD-10-CM | POA: Diagnosis not present

## 2023-06-27 DIAGNOSIS — E782 Mixed hyperlipidemia: Secondary | ICD-10-CM | POA: Diagnosis not present

## 2023-06-29 DIAGNOSIS — I10 Essential (primary) hypertension: Secondary | ICD-10-CM | POA: Diagnosis not present

## 2023-06-29 DIAGNOSIS — I70222 Atherosclerosis of native arteries of extremities with rest pain, left leg: Secondary | ICD-10-CM | POA: Diagnosis not present

## 2023-06-29 DIAGNOSIS — L97512 Non-pressure chronic ulcer of other part of right foot with fat layer exposed: Secondary | ICD-10-CM | POA: Diagnosis not present

## 2023-06-29 DIAGNOSIS — I2510 Atherosclerotic heart disease of native coronary artery without angina pectoris: Secondary | ICD-10-CM | POA: Diagnosis not present

## 2023-06-29 DIAGNOSIS — E1165 Type 2 diabetes mellitus with hyperglycemia: Secondary | ICD-10-CM | POA: Diagnosis not present

## 2023-06-29 DIAGNOSIS — E1151 Type 2 diabetes mellitus with diabetic peripheral angiopathy without gangrene: Secondary | ICD-10-CM | POA: Diagnosis not present

## 2023-06-29 DIAGNOSIS — E782 Mixed hyperlipidemia: Secondary | ICD-10-CM | POA: Diagnosis not present

## 2023-06-29 DIAGNOSIS — L03115 Cellulitis of right lower limb: Secondary | ICD-10-CM | POA: Diagnosis not present

## 2023-06-29 DIAGNOSIS — E11621 Type 2 diabetes mellitus with foot ulcer: Secondary | ICD-10-CM | POA: Diagnosis not present

## 2023-07-01 DIAGNOSIS — L97512 Non-pressure chronic ulcer of other part of right foot with fat layer exposed: Secondary | ICD-10-CM | POA: Diagnosis not present

## 2023-07-01 DIAGNOSIS — L03115 Cellulitis of right lower limb: Secondary | ICD-10-CM | POA: Diagnosis not present

## 2023-07-01 DIAGNOSIS — E1165 Type 2 diabetes mellitus with hyperglycemia: Secondary | ICD-10-CM | POA: Diagnosis not present

## 2023-07-01 DIAGNOSIS — I10 Essential (primary) hypertension: Secondary | ICD-10-CM | POA: Diagnosis not present

## 2023-07-01 DIAGNOSIS — E782 Mixed hyperlipidemia: Secondary | ICD-10-CM | POA: Diagnosis not present

## 2023-07-01 DIAGNOSIS — I2510 Atherosclerotic heart disease of native coronary artery without angina pectoris: Secondary | ICD-10-CM | POA: Diagnosis not present

## 2023-07-01 DIAGNOSIS — E1151 Type 2 diabetes mellitus with diabetic peripheral angiopathy without gangrene: Secondary | ICD-10-CM | POA: Diagnosis not present

## 2023-07-01 DIAGNOSIS — I70222 Atherosclerosis of native arteries of extremities with rest pain, left leg: Secondary | ICD-10-CM | POA: Diagnosis not present

## 2023-07-01 DIAGNOSIS — E11621 Type 2 diabetes mellitus with foot ulcer: Secondary | ICD-10-CM | POA: Diagnosis not present

## 2023-07-04 DIAGNOSIS — I70234 Atherosclerosis of native arteries of right leg with ulceration of heel and midfoot: Secondary | ICD-10-CM | POA: Diagnosis not present

## 2023-07-04 DIAGNOSIS — S2231XD Fracture of one rib, right side, subsequent encounter for fracture with routine healing: Secondary | ICD-10-CM | POA: Diagnosis not present

## 2023-07-04 DIAGNOSIS — I330 Acute and subacute infective endocarditis: Secondary | ICD-10-CM | POA: Diagnosis not present

## 2023-07-04 DIAGNOSIS — L03115 Cellulitis of right lower limb: Secondary | ICD-10-CM | POA: Diagnosis not present

## 2023-07-04 DIAGNOSIS — Z043 Encounter for examination and observation following other accident: Secondary | ICD-10-CM | POA: Diagnosis not present

## 2023-07-04 DIAGNOSIS — M6281 Muscle weakness (generalized): Secondary | ICD-10-CM | POA: Diagnosis not present

## 2023-07-04 DIAGNOSIS — E1140 Type 2 diabetes mellitus with diabetic neuropathy, unspecified: Secondary | ICD-10-CM | POA: Diagnosis not present

## 2023-07-04 DIAGNOSIS — I10 Essential (primary) hypertension: Secondary | ICD-10-CM | POA: Diagnosis not present

## 2023-07-04 DIAGNOSIS — R918 Other nonspecific abnormal finding of lung field: Secondary | ICD-10-CM | POA: Diagnosis not present

## 2023-07-04 DIAGNOSIS — D72829 Elevated white blood cell count, unspecified: Secondary | ICD-10-CM | POA: Diagnosis not present

## 2023-07-04 DIAGNOSIS — Z89412 Acquired absence of left great toe: Secondary | ICD-10-CM | POA: Diagnosis not present

## 2023-07-04 DIAGNOSIS — L97518 Non-pressure chronic ulcer of other part of right foot with other specified severity: Secondary | ICD-10-CM | POA: Diagnosis not present

## 2023-07-04 DIAGNOSIS — R7881 Bacteremia: Secondary | ICD-10-CM | POA: Diagnosis not present

## 2023-07-04 DIAGNOSIS — W19XXXD Unspecified fall, subsequent encounter: Secondary | ICD-10-CM | POA: Diagnosis not present

## 2023-07-04 DIAGNOSIS — S91301A Unspecified open wound, right foot, initial encounter: Secondary | ICD-10-CM | POA: Diagnosis not present

## 2023-07-04 DIAGNOSIS — M47817 Spondylosis without myelopathy or radiculopathy, lumbosacral region: Secondary | ICD-10-CM | POA: Diagnosis not present

## 2023-07-04 DIAGNOSIS — E1169 Type 2 diabetes mellitus with other specified complication: Secondary | ICD-10-CM | POA: Diagnosis not present

## 2023-07-04 DIAGNOSIS — L97512 Non-pressure chronic ulcer of other part of right foot with fat layer exposed: Secondary | ICD-10-CM | POA: Diagnosis not present

## 2023-07-04 DIAGNOSIS — R279 Unspecified lack of coordination: Secondary | ICD-10-CM | POA: Diagnosis not present

## 2023-07-04 DIAGNOSIS — D649 Anemia, unspecified: Secondary | ICD-10-CM | POA: Diagnosis not present

## 2023-07-04 DIAGNOSIS — M542 Cervicalgia: Secondary | ICD-10-CM | POA: Diagnosis not present

## 2023-07-04 DIAGNOSIS — Z794 Long term (current) use of insulin: Secondary | ICD-10-CM | POA: Diagnosis not present

## 2023-07-04 DIAGNOSIS — M129 Arthropathy, unspecified: Secondary | ICD-10-CM | POA: Diagnosis not present

## 2023-07-04 DIAGNOSIS — L97412 Non-pressure chronic ulcer of right heel and midfoot with fat layer exposed: Secondary | ICD-10-CM | POA: Diagnosis not present

## 2023-07-04 DIAGNOSIS — E11628 Type 2 diabetes mellitus with other skin complications: Secondary | ICD-10-CM | POA: Diagnosis not present

## 2023-07-04 DIAGNOSIS — R7401 Elevation of levels of liver transaminase levels: Secondary | ICD-10-CM | POA: Diagnosis not present

## 2023-07-04 DIAGNOSIS — I70222 Atherosclerosis of native arteries of extremities with rest pain, left leg: Secondary | ICD-10-CM | POA: Diagnosis not present

## 2023-07-04 DIAGNOSIS — L97519 Non-pressure chronic ulcer of other part of right foot with unspecified severity: Secondary | ICD-10-CM | POA: Diagnosis not present

## 2023-07-04 DIAGNOSIS — Z452 Encounter for adjustment and management of vascular access device: Secondary | ICD-10-CM | POA: Diagnosis not present

## 2023-07-04 DIAGNOSIS — E11621 Type 2 diabetes mellitus with foot ulcer: Secondary | ICD-10-CM | POA: Diagnosis not present

## 2023-07-04 DIAGNOSIS — L97909 Non-pressure chronic ulcer of unspecified part of unspecified lower leg with unspecified severity: Secondary | ICD-10-CM | POA: Diagnosis not present

## 2023-07-04 DIAGNOSIS — I2510 Atherosclerotic heart disease of native coronary artery without angina pectoris: Secondary | ICD-10-CM | POA: Diagnosis not present

## 2023-07-04 DIAGNOSIS — Z7982 Long term (current) use of aspirin: Secondary | ICD-10-CM | POA: Diagnosis not present

## 2023-07-04 DIAGNOSIS — E1165 Type 2 diabetes mellitus with hyperglycemia: Secondary | ICD-10-CM | POA: Diagnosis not present

## 2023-07-04 DIAGNOSIS — S12691D Other nondisplaced fracture of seventh cervical vertebra, subsequent encounter for fracture with routine healing: Secondary | ICD-10-CM | POA: Diagnosis not present

## 2023-07-04 DIAGNOSIS — B9562 Methicillin resistant Staphylococcus aureus infection as the cause of diseases classified elsewhere: Secondary | ICD-10-CM | POA: Diagnosis not present

## 2023-07-04 DIAGNOSIS — M25552 Pain in left hip: Secondary | ICD-10-CM | POA: Diagnosis not present

## 2023-07-04 DIAGNOSIS — I739 Peripheral vascular disease, unspecified: Secondary | ICD-10-CM | POA: Diagnosis not present

## 2023-07-04 DIAGNOSIS — M25474 Effusion, right foot: Secondary | ICD-10-CM | POA: Diagnosis not present

## 2023-07-04 DIAGNOSIS — S2231XA Fracture of one rib, right side, initial encounter for closed fracture: Secondary | ICD-10-CM | POA: Diagnosis not present

## 2023-07-04 DIAGNOSIS — M47816 Spondylosis without myelopathy or radiculopathy, lumbar region: Secondary | ICD-10-CM | POA: Diagnosis not present

## 2023-07-04 DIAGNOSIS — E119 Type 2 diabetes mellitus without complications: Secondary | ICD-10-CM | POA: Diagnosis not present

## 2023-07-04 DIAGNOSIS — L02611 Cutaneous abscess of right foot: Secondary | ICD-10-CM | POA: Diagnosis not present

## 2023-07-04 DIAGNOSIS — S12601D Unspecified nondisplaced fracture of seventh cervical vertebra, subsequent encounter for fracture with routine healing: Secondary | ICD-10-CM | POA: Diagnosis not present

## 2023-07-04 DIAGNOSIS — R9431 Abnormal electrocardiogram [ECG] [EKG]: Secondary | ICD-10-CM | POA: Diagnosis not present

## 2023-07-04 DIAGNOSIS — S12601A Unspecified nondisplaced fracture of seventh cervical vertebra, initial encounter for closed fracture: Secondary | ICD-10-CM | POA: Diagnosis not present

## 2023-07-04 DIAGNOSIS — L97419 Non-pressure chronic ulcer of right heel and midfoot with unspecified severity: Secondary | ICD-10-CM | POA: Diagnosis not present

## 2023-07-04 DIAGNOSIS — M25551 Pain in right hip: Secondary | ICD-10-CM | POA: Diagnosis not present

## 2023-07-04 DIAGNOSIS — R059 Cough, unspecified: Secondary | ICD-10-CM | POA: Diagnosis not present

## 2023-07-04 DIAGNOSIS — L089 Local infection of the skin and subcutaneous tissue, unspecified: Secondary | ICD-10-CM | POA: Diagnosis not present

## 2023-07-04 DIAGNOSIS — A499 Bacterial infection, unspecified: Secondary | ICD-10-CM | POA: Diagnosis not present

## 2023-07-04 DIAGNOSIS — M79671 Pain in right foot: Secondary | ICD-10-CM | POA: Diagnosis not present

## 2023-07-04 DIAGNOSIS — E1151 Type 2 diabetes mellitus with diabetic peripheral angiopathy without gangrene: Secondary | ICD-10-CM | POA: Diagnosis not present

## 2023-07-04 DIAGNOSIS — R5381 Other malaise: Secondary | ICD-10-CM | POA: Diagnosis not present

## 2023-07-04 DIAGNOSIS — M869 Osteomyelitis, unspecified: Secondary | ICD-10-CM | POA: Diagnosis not present

## 2023-07-04 DIAGNOSIS — G928 Other toxic encephalopathy: Secondary | ICD-10-CM | POA: Diagnosis not present

## 2023-07-04 DIAGNOSIS — E782 Mixed hyperlipidemia: Secondary | ICD-10-CM | POA: Diagnosis not present

## 2023-07-04 DIAGNOSIS — W19XXXA Unspecified fall, initial encounter: Secondary | ICD-10-CM | POA: Diagnosis not present

## 2023-07-04 DIAGNOSIS — A4102 Sepsis due to Methicillin resistant Staphylococcus aureus: Secondary | ICD-10-CM | POA: Diagnosis not present

## 2023-07-04 DIAGNOSIS — K5900 Constipation, unspecified: Secondary | ICD-10-CM | POA: Diagnosis not present

## 2023-07-04 NOTE — ED Triage Notes (Signed)
 Patient presents via EMS from home c/o mechanical fall (denies pain). Pt added that he been having increase fall (2x this month d/t weakness). -LOC, -hitting head, -blood thinners. Partial right foot amputee.    Patient also wanted to mention that he been having neck pain x1 week

## 2023-07-04 NOTE — ED Provider Notes (Signed)
 [name] Provider Note   ED Clinical Impression   Final diagnoses:  Fall, initial encounter (Primary)  Abscess of right foot    Impression, Medical Decision Making, ED Course   Impression: 78 y.o. male with PMH most significant for CAD, CVA (2021 and on Plavix  and aspirin ), hypertension, hyperlipidemia, T2DM, arthritis, foot osteomyelitis, and PVD s/p right TMA, left great toe amputation, stents left leg who presents after a mechanical fall earlier today as described below.   On physical exam patient is hemodynamically stable, afebrile.  Lungs are clear to auscultation bilaterally without any wheezing or crackles.  Abdomen is soft, nontender, nondistended.  No evidence of trauma to his face.  No bony tenderness to his cervical spine,  No tenderness to his thoracic or lumbar spine.  Bilateral lower extremities are atraumatic, no tenderness to palpation of his pelvis, knee flexion and external rotation of the hip without pain bilaterally.  No pain to his knees to palpation, passive flexion extension without pain or difficulty.  He has approximately quarter size wound to the plantar  surface of his right foot.  There is subtle erythema Around the ankle of his right foot.  Upper extremities are atraumatic, full range of motion, no bony tenderness. Diagnostic workup as below. Will treat patient with vancomycin  and IV fluids.  Orders Placed This Encounter  Procedures  . Blood Culture #1  . Blood Culture #2  . Aerobic/Anaerobic Culture  . Blood Culture Molecular Identification Panel  . Blood Culture Molecular Identification Panel  . Blood Culture #2  . Blood Culture #1  . CT Head Wo Contrast  . CT Cervical Spine Wo Contrast  . XR Chest 2 views  . XR Foot 2 Views Right  . CT Lower Extremity Right W Contrast  . MRI Lower Extremity Non-Joint Right W Contrast  . CBC w/ Differential  . Comprehensive Metabolic Panel  . Magnesium   . Lactate Sepsis, Venous  . C-reactive  protein  . Sedimentation rate, manual  . Urinalysis with Microscopy with Culture Reflex (Clean Catch)  . Lactate Sepsis, Venous  . Basic metabolic panel  . CBC  . Hepatic Function Panel  . Magnesium  Level  . NPO Sips with meds; Procedure/Test  . Patient may shower  . Vital signs  . Notify Provider  . Weigh patient  . Incentive Spirometry  . Flush line per protocol  . Notify Provider  . DNR (Do Not Resuscitate) and DNI (Do Not Intubate)  . Inpatient consult to Podiatry  . Inpatient consult to Pharmacy RX to dose: vancomycin   . Inpatient Consult to Wound Nurse  . Contact Isolation Status  . Occupational Therapy Eval and Treat  . Physical Therapy Eval and Treat  . ECG 12 Lead  . Echocardiogram W Colorflow Spectral Doppler  . Insert peripheral IV  . Saline lock IV  . Place Patient in Bed  . ED Admit Decision   Given patient's fall we will get CT head and CT cervical spine though there is some question as to whether patient hit his head when he fell.  Will evaluate for intracranial hemorrhage, fracture or traumatic lithiasis to cervical spine though low suspicion.  Given patient does not have any other bony tenderness, no chest wall tenderness, no complaints at this time will defer additional imaging.  I am concerned about the erythema around his right flank.  Son states that he has been a little bit more confused than normal so I think an infectious workup is warranted.  Will get x-ray of right foot, blood cultures, CRP and sed rate for evaluation of infection.  Lactate elevated at 2.2, CBC with leukocytosis to 13.4, CMP with elevated glucose, mild bump in LFTs but otherwise unremarkable.  CRP significantly elevated at 159, sed rate of 39.  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  Will start vancomycin .  X-ray of right foot shows possible subcutaneous air however I suspect that this is secondary to the wound.  Patient is nontoxic-appearing, no eschar, bullae or any  concerning feature of the extremity that would suggest necrotizing fasciitis.  Will get CT to further evaluate.  CT head negative for acute intracranial pathology, no evidence of intracranial hemorrhage.  There is a C7 transverse process fracture on CT scan of the neck.  I inquired and patient states that this is an old fracture, not new.  Reevaluated his neck and he continues to not have any bony tenderness over his C7 vertebra.   Patient is now having abdominal pain and right lower quadrant.  Will treat pain and reexamine as patient already had contrasted study here in the emergency department.  Will attempt direct admit to main given he will likely need podiatry.  CT lower extremity without evidence of gas producing infection.    Independent Interpretation of Studies: I have independently interpreted the following studies: 06/03/2023 [facility] Note for past medical history and 05/27/2023 [facility] Note for chart review. Chest x-ray with nodular opacity in the right lower lung, possible aspiration CT head negative for intracranial hemorrhage CT cervical spine with nondisplaced fracture in the right C7 transverse process and right posterior first rib, age-indeterminate CT right lower extremity with skin defect on the plantar aspect of the foot with underlying phlegmon, potential developing abscess  Discussion of Management With Other Providers or Support Staff: I discussed the management of this patient with the: [name], excepted patient ED to ED transfer for admission with podiatry  Considerations Regarding Disposition/Escalation of Care and Critical Care: Patient requiring inpatient management ____________________________________________  The case was discussed with the attending physician, who is in agreement with the above assessment and plan.    History   Chief Complaint Chief Complaint  Patient presents with  . Fall    HPI  [name] is a 78 y.o.  male with past medical history as below who presents via EMS after a fall. The patient reports a mechanical fall earlier today after he tripped over a bookshelf. Negative head strike or LOC. Patient is anticoagulated on Plavix . Family at bedside states the patient has had multiple falls in the past few days, which is not normal for him. He also endorses neck pain that he has had even prior to his fall. He does ambulate at baseline without the use of a cane or walker. Of note, family member notes the patient has had limited PO for the past few days. Also states that the patient's wound care nurse came to check on the patient earlier today and noticed the bandage on his foot was gone and he seemed off. Denies fevers, chills, cough, congestion, rhinorrhea, chest pain, shortness of breath, abdominal pain, back pain, dysuria, hematochezia, or melena.  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 He underwent debridement and dressing changes by podiatry and placed on antibiotics including Rocephin  and vancomycin . MRI tib/fib showed abscess but no evidence of  osteomyelitis. He was discharged on doxycycline .  Outside Historian(s): I have obtained additional history/collateral from family at bedside.  Past Medical History[1]  Past Surgical History[2]   Current Facility-Administered Medications:  .  acetaminophen  (TYLENOL ) tablet 650 mg, 650 mg, Oral, Q6H PRN, Kitay, Ilona, MD .  aluminum-magnesium  hydroxide-simethicone (MAALOX MAX) 80-80-8 mg/mL oral suspension, 30 mL, Oral, Q4H PRN, Kitay, Ilona, MD .  [Provider Hold] aspirin  chewable tablet 81 mg, 81 mg, Oral, Daily, Kitay, Ilona, MD .  [Provider Hold] atorvastatin (LIPITOR) tablet 40 mg, 40 mg, Oral, Nightly, Kitay, Ilona, MD, 40 mg at 07/05/23 2234 .  [Provider Hold] clopidogrel  (PLAVIX ) tablet 75 mg, 75 mg, Oral, Daily,  Kitay, Ilona, MD .  dextrose  50 % in water (D50W) 50 % solution 12.5 g, 12.5 g, Intravenous, Q15 Min PRN, Kitay, Ilona, MD .  enoxaparin  (LOVENOX ) syringe 40 mg, 40 mg, Subcutaneous, Nightly, Kitay, Ilona, MD, 40 mg at 07/05/23 2237 .  glucagon injection 1 mg, 1 mg, Intramuscular, Once PRN, Kitay, Ilona, MD .  glucose chewable tablet 16 g, 16 g, Oral, Q10 Min PRN, Kitay, Ilona, MD .  guaiFENesin (ROBITUSSIN) oral syrup, 200 mg, Oral, Q4H PRN, Kitay, Ilona, MD .  insulin  glargine (LANTUS ) injection BASAL 20 Units, 20 Units, Subcutaneous, Nightly, Kitay, Ilona, MD, 20 Units at 07/05/23 2234 .  insulin  lispro (HumaLOG) injection CORRECTIONAL 0-20 Units, 0-20 Units, Subcutaneous, ACHS, Keith Redhead, MD, 3 Units at 07/05/23 2236 .  lisinopril (PRINIVIL,ZESTRIL) tablet 40 mg, 40 mg, Oral, Daily, Kitay, Ilona, MD .  melatonin tablet 3 mg, 3 mg, Oral, Nightly PRN, Kitay, Ilona, MD .  piperacillin-tazobactam (ZOSYN) IVPB (premix) 4.5 g, 4.5 g, Intravenous, Q6H, Kitay, Ilona, MD, Last Rate: 200 mL/hr at 07/06/23 0643, 4.5 g at 07/06/23 0643 .  [Provider Hold] propranolol  (INDERAL ) tablet 30 mg, 30 mg, Oral, BID, Kitay, Ilona, MD .  senna (SENOKOT) tablet 2 tablet, 2 tablet, Oral, Nightly PRN, Kitay, Ilona, MD .  vancomycin  (VANCOCIN ) IVPB 1000 mg (premix), 1,000 mg, Intravenous, Q12H, Stopped at 07/06/23 0625 **AND** Inpatient consult to Pharmacy RX to dose: vancomycin , , , Once, Keith Redhead, MD  Allergies Patient has no known allergies.  Family History Family History[3]  Social History Short Social History[4]   Physical Exam   VITAL SIGNS:    Vitals:   07/05/23 2200 07/05/23 2315 07/06/23 0515 07/06/23 0530  BP:  175/65 145/62 148/94  Pulse:  78 72 84  Resp:  26 24 22   Temp: 37.3 C (99.1 F) 37.3 C (99.1 F) 37.8 C (100 F) 36.7 C (98.1 F)  TempSrc: Oral Oral Oral Oral  SpO2:  97% 96% 96%  Weight:      Height:        Constitutional: Alert and oriented. No acute distress. Eyes:  Conjunctivae are normal. HEENT: Normocephalic and atraumatic. Conjunctivae clear. No congestion. Moist mucous membranes.  Cardiovascular: Rate as above, regular rhythm. Normal and symmetric distal pulses. Brisk capillary refill. Normal skin turgor. Respiratory: Normal respiratory effort. Breath sounds are normal. There are no wheezing or crackles heard. Gastrointestinal: Soft, non-distended, non-tender. Genitourinary: Deferred. Musculoskeletal: Non-tender with normal range of motion in all extremities. Neurologic: Normal speech and language. No gross focal neurologic deficits are appreciated. Patient is moving all extremities equally, face is symmetric at rest and with speech. Skin: Skin is warm, dry and intact.  Diabetic foot ulcer on the plantar aspect of the right foot, mild erythema of the right ankle Psychiatric: Mood and affect are normal. Speech and behavior are normal.  Radiology   CT Lower Extremity Right W Contrast  Final Result  Skin defect along the plantar aspect of the foot with underlying phlegmonous changes and likely developing abscess. Extensive erosive changes and fragmentation of the foot limits evaluation for underlying osteomyelitis. If there is persistent concern, MRI could be obtained for more complete evaluation.    No evidence of gas-forming infection, as questioned on prior radiograph.      XR Foot 2 Views Right  Final Result  --Patient positioning and extensive erosive changes of all the visualized joints limits evaluation for potential osteomyelitis. If there is persistent concern, this would be better evaluated with MRI.    --Lucencies along the plantar aspect of the foot extending along the posterior aspect of the calcaneus are incompletely visualized but may represent gas-forming infection. Recommend correlation with physical exam. Cross-sectional imaging could be obtained for further evaluation, if clinically indicated.      The findings of this study were  discussed via telephone with DR. Kim by Dr. Lynwood Search on 07/05/2023 2:19 AM.      CT Head Wo Contrast  Final Result    --No evidence of acute intracranial pathology    --Chronic maxillary sinusitis..    --Suspect nondisplaced fractures of the right C7 transverse process and right posterior first rib, age indeterminant.        CT Cervical Spine Wo Contrast  Final Result    --No evidence of acute intracranial pathology    --Chronic maxillary sinusitis..    --Suspect nondisplaced fractures of the right C7 transverse process and right posterior first rib, age indeterminant.        XR Chest 2 views  Final Result    Nodular opacity in the right lower leg without correlate on lateral view which may represent pneumonia or aspiration in the appropriate clinical setting. Consider follow-up radiograph in 6 to 8 weeks to ensure resolution. If no resolution, or chest CT for evaluate for pulmonary nodules.          MRI Lower Extremity Non-Joint Right W Contrast    (Results Pending)  Echocardiogram W Colorflow Spectral Doppler    (Results Pending)    Pertinent labs & imaging results that were available during my care of the patient were independently interpreted by me and considered in my medical decision making (see chart for details).  Portions of this record have been created using Scientist, clinical (histocompatibility and immunogenetics). Dictation errors have been sought, but may not have been identified and corrected.  Documentation assistance was provided by Almarie Crawley, Scribe, on July 04, 2023 at 11:20 PM for Devere Berber, MD.  Documentation assistance was provided by the scribe in my presence.  The documentation recorded by the scribe has been reviewed by me and accurately reflects the services I personally performed.      Berber Devere CROME, MD Resident 07/06/23 [phone]     [1] Past Medical History: Diagnosis Date  . Diabetes mellitus     . Hyperlipidemia   . Hypertension   . Stroke      [2] Past Surgical History: Procedure Laterality Date  . CORONARY ARTERY BYPASS GRAFT    . TOE AMPUTATION     L great toe  [3] No family history on file. [4] Social History Tobacco Use  . Smoking status: Never  . Smokeless tobacco: Never  Substance Use Topics  . Alcohol use: No  . Drug use: No

## 2023-07-05 DIAGNOSIS — L02611 Cutaneous abscess of right foot: Secondary | ICD-10-CM | POA: Diagnosis not present

## 2023-07-05 DIAGNOSIS — R918 Other nonspecific abnormal finding of lung field: Secondary | ICD-10-CM | POA: Diagnosis not present

## 2023-07-05 DIAGNOSIS — E119 Type 2 diabetes mellitus without complications: Secondary | ICD-10-CM | POA: Diagnosis not present

## 2023-07-05 DIAGNOSIS — E11621 Type 2 diabetes mellitus with foot ulcer: Secondary | ICD-10-CM | POA: Diagnosis not present

## 2023-07-05 DIAGNOSIS — M79671 Pain in right foot: Secondary | ICD-10-CM | POA: Diagnosis not present

## 2023-07-05 DIAGNOSIS — L97412 Non-pressure chronic ulcer of right heel and midfoot with fat layer exposed: Secondary | ICD-10-CM | POA: Diagnosis not present

## 2023-07-05 DIAGNOSIS — Z043 Encounter for examination and observation following other accident: Secondary | ICD-10-CM | POA: Diagnosis not present

## 2023-07-05 DIAGNOSIS — M129 Arthropathy, unspecified: Secondary | ICD-10-CM | POA: Diagnosis not present

## 2023-07-05 DIAGNOSIS — Z7982 Long term (current) use of aspirin: Secondary | ICD-10-CM | POA: Diagnosis not present

## 2023-07-05 DIAGNOSIS — Z794 Long term (current) use of insulin: Secondary | ICD-10-CM | POA: Diagnosis not present

## 2023-07-05 DIAGNOSIS — D72829 Elevated white blood cell count, unspecified: Secondary | ICD-10-CM | POA: Diagnosis not present

## 2023-07-05 DIAGNOSIS — R059 Cough, unspecified: Secondary | ICD-10-CM | POA: Diagnosis not present

## 2023-07-05 DIAGNOSIS — I2510 Atherosclerotic heart disease of native coronary artery without angina pectoris: Secondary | ICD-10-CM | POA: Diagnosis not present

## 2023-07-05 DIAGNOSIS — I10 Essential (primary) hypertension: Secondary | ICD-10-CM | POA: Diagnosis not present

## 2023-07-05 NOTE — ED Provider Notes (Signed)
 ED Progress Note  Received sign out from previous provider.  Patient Summary: [name] is a 78 y.o. male presenting as a transfer from [facility], with a notable history of CAD, CVA on Plavix  and aspirin , hypertension, hyperlipidemia, type 2 diabetes, PVD status post right TMA and recent I&D of abscess, presenting following a mechanical fall found to have elevated lactate and leukocytosis as well as erosive changes in the right foot concerning for osteomyelitis versus recurrent development of abscess on CT.  Patient was transferred here for evaluation by podiatry and further IV antibiotics. Action List:  Reach out to podiatry with respect to this patient Spoke with the MAO with respect to this patient, he is in line for medical admission at this time  Updates ED Course as of 07/05/23 1807  Tue Jul 05, 2023  1516 PT refusing MRI, discussed that this will help us  understand his treatment plan and help determine whether he requires any surgery or continued IV antibiotics, he understands this and declines stating that he has an MRI scheduled tomorrow.  He understands that he will be not home tomorrow and will be admitted to the hospital but he insist that he would like to obtain the MRI tomorrow.  He is not open to obtaining it today.  1526 Staph aureus on 1/2 blood cultures methicillin resistant.  1639 Second blood culture positive for MRSA.  Patient has already received vancomycin .   Patient to be handed off to oncoming provider pending admitting team, he is agreeable to obtaining MRI tomorrow, continues to maintain this.

## 2023-07-06 DIAGNOSIS — M25474 Effusion, right foot: Secondary | ICD-10-CM | POA: Diagnosis not present

## 2023-07-06 DIAGNOSIS — L97518 Non-pressure chronic ulcer of other part of right foot with other specified severity: Secondary | ICD-10-CM | POA: Diagnosis not present

## 2023-07-06 DIAGNOSIS — L97519 Non-pressure chronic ulcer of other part of right foot with unspecified severity: Secondary | ICD-10-CM | POA: Diagnosis not present

## 2023-07-06 DIAGNOSIS — R9431 Abnormal electrocardiogram [ECG] [EKG]: Secondary | ICD-10-CM | POA: Diagnosis not present

## 2023-07-06 DIAGNOSIS — E11621 Type 2 diabetes mellitus with foot ulcer: Secondary | ICD-10-CM | POA: Diagnosis not present

## 2023-07-06 DIAGNOSIS — R7881 Bacteremia: Secondary | ICD-10-CM | POA: Diagnosis not present

## 2023-07-06 DIAGNOSIS — B9562 Methicillin resistant Staphylococcus aureus infection as the cause of diseases classified elsewhere: Secondary | ICD-10-CM | POA: Diagnosis not present

## 2023-07-06 NOTE — Consults (Addendum)
 OCCUPATIONAL THERAPY Evaluation (07/06/23 9099-9072 & [id number] cotx with PT)  Patient Name:  Jason [name]      Medical Record Number: [id number]   Date of Birth: 05-30-1945 Sex: Male    Post-Discharge Occupational Therapy Recommendations: 5x weekly, Low intensity       Equipment Recommendation OT DME Recommendations: Defer to post acute, Tub Transfer Bench     OT Treatment Diagnosis: Generalized muscle weakness, Unsteadiness on feet, Need for assistance with personal care      Assessment Problem List: Pain, Decreased strength, Decreased range of motion, Impaired ADLs, Fall risk, Decreased skin integrity, Decreased mobility, Orthopedic restrictions, Decreased activity tolerance, Impaired balance, Decreased endurance        Assessment: Per EPIC, Jason [name] is a 78 y.o. male whose presentation is complicated by CVA (2021), CAD, HTN, HLD, T2DM, PVD s/p R TMA, L great toe amputation, L leg stents and recent RLE abscess s/p I&D who presented to [facility] for fall and found to have MRSA bacteremia and c/f osteomyelitis vs abscess.  Patient seen for initial OT evaluation and occupational profile. With consideration of patient's occupational profile, assessment review, level of clinical decision making involved, and intervention plan, patient presents as a moderate complexity case w/ the following functional deficits: decreased activity tolerance, decreased UE strength, decreased LE strength, impaired UE ROM, impaired LE ROM, poor pain control, and orthopedic limitations that impact independent participation in ADLs. Pt is currently performing well below his functional baseline of Mod I for all mobility and self care tasks. Pt would benefit from skilled acute OT services to address above noted deficits and recommend post-acute OT 5x/week low intensity to maximize safety and functional independence.       Today's Interventions: Patient educated regarding: role of OT, OT POC, energy  conservation techniques, weight bearing status, positioning/gentle ROM for improved pain control, and the role of daily participation in ADLs for maintaining/improving activity tolerance while in the hospital. Interventions including bed mobility with Max A x2 to total A this date, dynamic sitting EOB, weight shifting while seated.   Activity Tolerance During Today's Session Tolerated treatment well  Plan Planned Frequency of Treatment: Plan of Care Initiated: 07/06/23 1-2x per day Weekly Frequency: 3-4 days per week Planned Treatment Duration: 07/27/23  Planned Interventions:  Education (Patient/Family/Caregiver), Self-Care/Home Training, Therapeutic Exercise, Therapeutic Activity, Home Exercise Program    GOALS:  Patient and Family Goals: None stated at time of evaluation.  Short Term:  SHORT GOAL #1: Pt will complete all aspects of toileting tasks with Mod I using LRAD while maintaining WB precautions for RLE.  Time Frame : 2 weeks SHORT GOAL #2: Pt will complete full body dressing with Mod I while using AE as needed.  Time Frame : 2 weeks SHORT GOAL #3: Pt will complete standing ADL tasks for >5 minutes with Mod I using LRAD while maintaining WB precautions for RLE.  Time Frame : 2 weeks      Long Term Goal #1: Pt will score 20+/24 on the AM-PAC. Time Frame: 3 weeks  Prognosis:  Good Positive Indicators:  Motivation Barriers to Discharge: Pain, Inability to safely perform ADLS, Endurance deficits, Functional strength deficits, Decreased caregiver support, Impaired Balance, Severity of deficits  Subjective Medical Updates Since Last Visit/Relevant PMH Affecting Clinical Decision Making: n/a Prior Functional Status PTA pt reports being Mod I for all ADLs and functional mobility without use of assistive devices. He performs most IADL tasks; however, his family also assists with this as needed. He  reports 2 recent falls in the last 3 months, stating he tripped over things and this  last fall was a bad one where he needed assistance to get up. He enjoys watching sports specifically football and baseball. He works at [employer] in [employer] but states he has been on a leave since the middle of May d/t his health.  Living Situation Living Environment: House Lives With: Family Mining engineer, her husband and 2 great grandchildren (41 y/o twins)) Home Living: Two level home, Able to Live on main level with bedroom/bathroom, Stairs to enter without rails, Hand-held shower hose, Handicapped height toilet, Tub/shower unit Number of Stairs to Enter (outside): 1 Caregiver Identified?: Yes Caregiver Availability: Intermittent Caregiver Ability: Limited lifting   Equipment available at home: None  Medical Tests / Procedures: Reviewed via EPIC     Patient / Caregiver reports: Charity fundraiser and pt agreeable to skilled OT session. Pt supine in bed upon arrival to room.   Past Medical History[1] Social History   Tobacco Use  . Smoking status: Never  . Smokeless tobacco: Never  Substance Use Topics  . Alcohol use: No    Past Surgical History[2] Family History[3]   Patient has no known allergies.   Objective Findings Precautions / Restrictions  Falls precautions, Isolation precautions     Weight Bearing  R Heel weight bearing  Required Braces or Orthoses  Non-applicable  Communication Preference  Verbal     Pain  Pt reporting 6/10 pain all over. Activities adjusted per pt's tolerance. RN aware.  Equipment / Environment  Vascular access (PIV, TLC, Port-a-cath, PICC)    Cognition  Orientation Level:  Oriented x 4  Arousal/Alertness:  Appropriate responses to stimuli  Attention Span:  Appears intact  Memory:  Appears intact  Following Commands:  Follows one step commands without difficulty, Follows one step commands consistently  Safety Judgment:  Good awareness of safety precautions  Awareness of Errors and Problem Solving:  Able to problem solve independently  Vision /  Hearing  Vision: Glasses present, Wears glasses all the time   Hearing: No deficit identified      Hand Function: Right Hand Function: Right hand grip strength, ROM and coordination WNL Left Hand Function: Left hand grip strength, ROM and coordination WNL Hand Dominance: Right  Skin Inspection: Skin Inspection: Open wound on botton of R foot, RN alerted. Dressing initially not applied to R foot; however, RN applying dressing.     ROM / Strength: UE ROM/Strength: Left Impaired/Limited, Right Impaired/Limited RUE Impairment: Pain with movement, Limited AROM LUE Impairment: Pain with movement, Limited AROM UE ROM/ Strength Comment: BUE ROM ~110 degrees flexion LE ROM/Strength: Left Impaired/Limited, Right Impaired/Limited RLE Impairment: Limited AROM, Pain with movement LLE Impairment: Limited AROM, Pain with movement LE ROM/ Strength Comment: L great toe and 5th ray amputations and R TMA  Coordination: Coordination: WFL  Sensation: RUE Sensation: RUE intact LUE Sensation: LUE intact RLE Sensation: RLE intact LLE Sensation: LLE intact Sensory/ Proprioception/ Stereognosis comments: Pt denies n/t  Balance: Animal nutritionist of Assistance: Stand by Assistance (Max A initially and progressing to SBA.) Dynamic Sitting-Level of Assistance: Stand by assistance, Contact guard Sitting Balance comments: Initially required Max A then progressing to SBA Static Standing-Level of Assistance: Unable to assess Dynamic Standing - Level of Assistance: Unable to assess  Functional Mobility Transfers:  (Unable to perform mobility assessment at this time 2/2 to increased pain levels.  Bed Mobility - Needs Assistance: Max assist x2 -Total A Ambulation: Unable to perform  mobility assessment 2/2 to increased pain.   ADLs ADLs: Needs assistance with ADLs ADLs - Needs Assistance: Feeding, Grooming, Bathing, Toileting, UB dressing, LB dressing Feeding - Needs Assistance: Supervision Grooming  - Needs Assistance: Supervision Bathing - Needs Assistance: Max assist Toileting - Needs Assistance: Max assist UB Dressing - Needs Assistance: Min assist LB Dressing - Needs Assistance: Total Assist IADLs: NT  Vitals / Orthostatics Vitals/Orthostatics: NAD  Patient at end of session:  (Pt left on stretcher with staff.) Pt seen later in day with PT for mobility assessment, pt left supine in bed with bed alarm on, lines intact and RN aware.   Occupational Therapy Session Duration OT Individual [mins]: 27 OT Co-Treatment [mins]: 27 (with Nola Burly, PT) Reason for Co-treatment: Poor pain control     AM-PAC-Daily Activity Lower Body Dressing assistance needs: Unable to do/total assistance - Total Dependent Assist Bathing assistance needs: A lot - Maximum/Moderate Assistance Toileting assistance needs: A lot - Maximum/Moderate Assistance Upper Body Dressing assistance needs: A Little - Minimal/Contact Guard Assist/Supervision Personal Grooming assistance needs: A Little - Minimal/Contact Guard Assist/Supervision Eating Meals assistance needs: A Little - Minimal/Contact Guard Assist/Supervision  Daily Activity Score: 14  Score (in points): % of Functional Impairment, Limitation, Restriction 6: 100% impaired, limited, restricted 7-8: At least 80%, but less than 100% impaired, limited restricted 9-13: At least 60%, but less than 80% impaired, limited restricted 14-19: At least 40%, but less than 60% impaired, limited restricted 20-22: At least 20%, but less than 40% impaired, limited restricted 23: At least 1%, but less than 20% impaired, limited restricted 24: 0% impaired, limited restricted   I attest that I have reviewed the above information. Signed: Almarie Lukes, OT Filed 07/06/2023      [1] Past Medical History: Diagnosis Date  . Diabetes mellitus      . Hyperlipidemia   . Hypertension   . Stroke      [2] Past Surgical History: Procedure Laterality Date  .  CORONARY ARTERY BYPASS GRAFT    . TOE AMPUTATION     L great toe  [3] History reviewed. No pertinent family history.

## 2023-07-06 NOTE — Consults (Signed)
 PHYSICAL THERAPY Evaluation (07/06/23 1459)      Patient Name:  Jason [name]      Medical Record Number: [id number]  Date of Birth: 1945/11/18 Sex: Male      Post-Discharge Physical Therapy Recommendations: PT Post Acute Discharge Recommendations: Skilled PT services indicated, 5x weekly, Low intensity  Equipment Recommendation PT DME Recommendations: Defer to post acute        Treatment Diagnosis: Abnormalities of gait and mobility     Activity Tolerance: Limited by pain   ASSESSMENT Problem List: Decreased strength, Impaired balance, Pain, Decreased endurance, Decreased mobility, Gait deviation, Fall risk, Impaired ADLs    Assessment : Jason [name] is a 78 y.o. male whose presentation is complicated by CVA (2021), CAD, HTN, HLD, T2DM, PVD s/p R TMA, L great toe amputation, L leg stents and recent RLE abscess s/p I&D who presented to [facility] for fall and found to have MRSA bacteremia and c/f osteomyelitis vs abscess. Pt agreeable to afternoon session. He current needs Mod A+2 to perform bed mobilty, overall session limited 2/2 8/10 pain, RN aware, providing pain madication prior to further attempts of mobility. Pt declining attempts of standing this day, attempted to cue patient for lateral scooting but pt unable to due to pain. Pt able to tolerate sitting EOB initially at max A but once sitting position adjusted pt progressing to SBA. Pt would benefit from skilled acute PT services during hospital stay. Current PT recommendation of 5xL for post acute services.    Today's Interventions: PT eval, bed mobility, sitting EOB tolerance. Pt edu: PT role/POC, importance of OOB positioning and mobility with staff to maximize mobility and safety, WBing precautions, safety with mobility.   Personal Factors/Comorbidities Present: 3+  Examination of Body System: Musculoskeletal, Cardiovascular, Pulmonary, Activity/participation, Communication Clinical Presentation: Evolving   Clinical  Decision Making: Moderate     PLAN Planned Frequency of Treatment: Plan of Care Initiated: 07/06/23 1-2x per day Weekly Frequency: 3-4 days per week Planned Treatment Duration: 07/20/23   Planned Interventions: Education (Patient/Family/Caregiver), Gait training, Home exercise program, Self-care / Home Management training, Therapeutic Exercise, Therapeutic Activity   Goals:      SHORT GOAL #1: Pt will perform all bed mobility at Min A from flat bed.              Time Frame : 2 weeks SHORT GOAL #2: Pt will participate in OOB assessment             Time Frame : 2 weeks                                   Long Term Goal #1: Pending OOB assessment Time Frame: 4 weeks   Prognosis:  Fair Positive Indicators: PLOF, family support Barriers to Discharge: Pain, Inability to safely perform ADLS, Endurance deficits, Functional strength deficits, Decreased caregiver support, Impaired Balance, Severity of deficits   SUBJECTIVE Communication Preference: Verbal    Patient reports: Pt agreeable to PT Pain Comments: Pt reports 8/10 pain everywhere     Prior Functional Status: Pt reports being Mod I for all mobility and ADL's without a device. He reports family assist with various tasks in the house PRN. He reports x2 recent falls where he tripped. 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 Living Situation Living Environment: House Lives With: Family Mining engineer,  her husband and 2 great grandchildren (36 y/o twins)) Home Living: Two level home, Able to Live on main level with bedroom/bathroom, Stairs to enter without rails, Hand-held shower hose, Handicapped height toilet, Tub/shower unit Number of Stairs to Enter (outside): 1    Equipment available at home: None     Past Medical History[1]        Social History   Tobacco Use  . Smoking status: Never  . Smokeless tobacco: Never  Substance Use Topics  . Alcohol use: No      Past Surgical History[2]         Family History[3]   Allergies: Patient has no known allergies.            Objective Findings Precautions / Restrictions Precautions: Falls precautions, Isolation precautions Weight Bearing Status: R Heel weight bearing Required Braces or Orthoses: Non-applicable     Equipment / Environment: Vascular access (PIV, TLC, Port-a-cath, PICC)   Vitals/Orthostatics : VSS per epic   Cognition: WFL Visual/Perception: Wears Glasses/Contacts all the time Hearing: No deficit identified   Skin Inspection: Intact where visualized   Upper Extremities UE ROM: Right WFL, Left WFL UE Strength: Left Impaired/Limited, Right Impaired/Limited RUE Strength Impairment: Reduced strength LUE Strength Impairment: Reduced strength  Lower Extremities LE ROM: Right WFL, Left WFL LE Strength: Right Impaired/Limited, Left Impaired/Limited RLE Strength Impairment: Reduced strength LLE Strength Impairment: Reduced strength      Sensation: WFL  Dynamic Sitting-Level of Assistance: Unable to assess Sitting Balance comments: Initial Max A, progressed to SBA  Static Standing-Level of Assistance: Unable to assess Dynamic Standing - Level of Assistance: Unable to assess Standing Balance comments: unable to stand during session    Bed Mobility: Supine to Sit Supine to Sit assistance level: +2 assist, Maximum assist, patient does 25-49% Bed Mobility comments: Pt performing sup<>sit Max A +2, Once sitting EOB pt needing max A but quickly progressed to SBA.   Transfer comments: Unable to progress    Skilled Treatment Performed: unable to progress               Patient at end of session: All needs in reach, In bed, Notified Nurse, Lines intact  Physical Therapy Session Duration PT Individual [mins]: 10 PT Co-Treatment [mins]: 17 (With Almarie Lukes, OT) Reason for Co-treatment: To safely progress mobility      AM-PAC-6 click Help currently need  turning over In bed?: A lot - Maximum/Moderate Assistance Help currently needed sitting down/standing up from chair with arms? : A lot - Maximum/Moderate Assistance Help currently needed moving from supine to sitting on edge of bed?: A lot - Maximum/Moderate Assistance Help currently needed moving to and from bed from wheelchair?: A lot - Maximum/Moderate Assistance Help currently needed walking in a hospital room?: Unable to do/total assistance - Total Dependent Assist Help currently needed climbing 3-5 steps with railing?: Unable to do/total assistance - Total Dependent Assist  Basic Mobility Score 6 click: 10  6 click Score (in points): % of Functional Impairment, Limitation, Restriction 6: 100% impaired, limited, restricted 7-8: At least 80%, but less than 100% impaired, limited restricted 9-13: At least 60%, but less than 80% impaired, limited restricted 14-19: At least 40%, but less than 60% impaired, limited restricted 20-22: At least 20%, but less than 40% impaired, limited restricted 23: At least 1%, but less than 20% impaired, limited restricted 24: 0% impaired, limited restricted  'AM-PAC' forms are Copyright protected by The Trustees of [address]     I  attest that I have reviewed the above information. Signed: Karyle Burly, PT Filed 07/06/2023       [1] Past Medical History: Diagnosis Date  . Diabetes mellitus      . Hyperlipidemia   . Hypertension   . Stroke      [2] Past Surgical History: Procedure Laterality Date  . CORONARY ARTERY BYPASS GRAFT    . TOE AMPUTATION     L great toe  [3] History reviewed. No pertinent family history.

## 2023-07-06 NOTE — Nursing Note (Signed)
   07/06/23 0935  Missed Session   Note Type Contact Note  Missed Therapy Reason Unavailable (Attempted for PT eval; pt currently off floor with imaging. Will follow up as appropriate.)  PT Missed Minutes 0

## 2023-07-06 NOTE — Care Plan (Signed)
 Shift Summary Contact precautions were maintained throughout the shift.   An open wound on the bottom of the right foot was noted in the morning.   Acetaminophen  was administered in the [facility] PRN department, and pain level returned to 0 by the end of the shift.   No falls or fall-related injuries occurred during the shift.   Overall, the patient's status remained stable with effective management of pain and infection precautions.   Absence of Infection Signs and Symptoms: Contact precautions were maintained throughout the shift. Skin inspection revealed an open wound on the bottom of the right foot in the morning, but later inspections showed intact skin where visualized.   Absence of Fall and Fall-Related Injury: Hourly visual checks confirmed the patient was awake and in bed, and toilet assistance was provided every two hours without incident. No safety devices were needed for transfers during the shift.

## 2023-07-07 DIAGNOSIS — E11628 Type 2 diabetes mellitus with other skin complications: Secondary | ICD-10-CM | POA: Diagnosis not present

## 2023-07-07 DIAGNOSIS — B9562 Methicillin resistant Staphylococcus aureus infection as the cause of diseases classified elsewhere: Secondary | ICD-10-CM | POA: Diagnosis not present

## 2023-07-07 DIAGNOSIS — E1140 Type 2 diabetes mellitus with diabetic neuropathy, unspecified: Secondary | ICD-10-CM | POA: Diagnosis not present

## 2023-07-07 DIAGNOSIS — E11621 Type 2 diabetes mellitus with foot ulcer: Secondary | ICD-10-CM | POA: Diagnosis not present

## 2023-07-07 DIAGNOSIS — R7881 Bacteremia: Secondary | ICD-10-CM | POA: Diagnosis not present

## 2023-07-07 DIAGNOSIS — L089 Local infection of the skin and subcutaneous tissue, unspecified: Secondary | ICD-10-CM | POA: Diagnosis not present

## 2023-07-07 DIAGNOSIS — L02611 Cutaneous abscess of right foot: Secondary | ICD-10-CM | POA: Diagnosis not present

## 2023-07-07 DIAGNOSIS — L97518 Non-pressure chronic ulcer of other part of right foot with other specified severity: Secondary | ICD-10-CM | POA: Diagnosis not present

## 2023-07-07 DIAGNOSIS — S91301A Unspecified open wound, right foot, initial encounter: Secondary | ICD-10-CM | POA: Diagnosis not present

## 2023-07-07 DIAGNOSIS — M25552 Pain in left hip: Secondary | ICD-10-CM | POA: Diagnosis not present

## 2023-07-07 DIAGNOSIS — M25551 Pain in right hip: Secondary | ICD-10-CM | POA: Diagnosis not present

## 2023-07-07 NOTE — Care Plan (Signed)
 Shift Summary Peripheral IV sites were assessed and maintained clean, dry, and intact.  Infection prevention measures were implemented, including hand hygiene and a single patient room.  Potassium chloride  was administered in [facility].  Acetaminophen  was administered in [facility] PRN.  Pain assessment indicated no pain, contributing to overall comfort and wellbeing.  Primofit placed to ensure continence.  Skin Health and Integrity: Positioning was regularly adjusted to prevent pressure injuries, and perineal care was performed to maintain skin integrity.   Optimal Comfort and Wellbeing: Comfort interventions included repositioning and sleep/rest enhancement, contributing to a gradual improvement in comfort levels.   Absence of Infection Signs and Symptoms: Aseptic techniques and infection prevention measures were consistently maintained throughout the shift.   Absence of Fall and Fall-Related Injury: Hourly visual checks and regular toileting were conducted to minimize fall risk, with no falls reported.

## 2023-07-07 NOTE — Consults (Signed)
 Care Management Initial Transition Planning Assessment   CM talked with patient at bedside to complete assessment as well as to confirm all information pertaining to any discharge planning needs. CM introduced self, explained role, and confirmed demographic information.Patient lives with Alfonso Silvan (daughter) her husband and their two kids in [facility]) in a 2 level home with 1 steps to enter.  At baseline patient is independent with ADLs. He does not receive home health. DME is RW.  Daughter or her husband  will provide transportation and will assist with basic care at home.  Type of Residence: Mailing Address:  [address] [place] [address] Contacts: Accompanied by: Family member Patient Phone Number: [phone] (mobile)       Medical Provider(s): Feldpausch, Cheryl Menghini, MD Reason for Admission: Admitting Diagnosis:  Abscess of right foot [L02.611] Fall, initial encounter [W19.XXXA] Past Medical History:   has a past medical history of Diabetes mellitus, Hyperlipidemia, Hypertension, and Stroke. 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  Previous admit date: N/A  Primary Insurance- Payor: [employer] MEDICARE ADV / Plan: [employer] MEDICARE ADV / Product Type: *No Product type* /  Secondary Insurance - None Prescription Coverage - N/A Preferred Pharmacy - [id number] - [place] - [address] [place] [address]  Transportation home: Academic librarian / Social Worker assessed the patient by : In person interview with patient Orientation Level: Oriented X4 Functional level prior to admission: Independent Reason for referral: Discharge Planning  Contact/Decision Maker Extended Emergency Contact Information Primary Emergency Contact: silvan alfonso Mobile Phone: [phone] Relation: Daughter Preferred language:  ENGLISH Interpreter needed? No  Legal Next of Kin / Guardian / POA / Advance Directives    Advance Directive (Medical Treatment) Does patient have an advance directive covering medical treatment?: Patient does not have advance directive covering medical treatment. Reason patient does not have an advance directive covering medical treatment:: Patient needs follow-up to complete one.        Readmission Information      Patient Information Lives with: Children, Family members Rosaline Silvan (daughter) her husband and their 2 girls)  Type of Residence: Private residence        Support Systems/Concerns: Children, Family Members  Responsibilities/Dependents at home?: No  Home Care services in place prior to admission?: Yes Type of Home Care services in place prior to admission: Home OT, Home PT Current Home Care provider (Name/Phone #): Patient could not remember the name of the agency       Equipment Currently Used at Home: walker, rolling    Currently receiving outpatient dialysis?: No    Financial Information    Need for financial assistance?: No    Social Determinants of Health Social Drivers of Health   Food Insecurity: No Food Insecurity (07/07/2023)   Hunger Vital Sign   . Worried About Programme researcher, broadcasting/film/video in the Last Year: Never true   . Ran Out of Food in the Last Year: Never true  Tobacco Use: Low Risk  (07/06/2023)   Patient History   . Smoking Tobacco Use: Never   . Smokeless Tobacco Use: Never   . Passive Exposure: Not on file  Recent Concern: Tobacco Use - Medium Risk (06/22/2023)   Received from [facility]   Patient History   .  Smoking Tobacco Use: Former   . Smokeless Tobacco Use: Never   . Passive Exposure: Not on file  Transportation Needs: No Transportation Needs (07/07/2023)   PRAPARE - Transportation   . Lack of Transportation (Medical): No   . Lack of Transportation (Non-Medical): No  Alcohol Use: Not on file   Housing: Low Risk  (07/07/2023)   Housing   . Within the past 12 months, have you ever stayed: outside, in a car, in a tent, in an overnight shelter, or temporarily in someone else's home (i.e. couch-surfing)?: No   . Are you worried about losing your housing?: No  Physical Activity: Not on file  Utilities: Low Risk  (07/07/2023)   Utilities   . Within the past 12 months, have you been unable to get utilities (heat, electricity) when it was really needed?: No  Stress: Not on file  Interpersonal Safety: Not on file  Substance Use: Not on file (11/21/2022)  Intimate Partner Violence: Not At Risk (05/27/2023)   Received from [facility]   Humiliation, Afraid, Rape, and Kick questionnaire   . Within the last year, have you been afraid of your partner or ex-partner?: No   . Within the last year, have you been humiliated or emotionally abused in other ways by your partner or ex-partner?: No   . Within the last year, have you been kicked, hit, slapped, or otherwise physically hurt by your partner or ex-partner?: No   . Within the last year, have you been raped or forced to have any kind of sexual activity by your partner or ex-partner?: No  Social Connections: Moderately Isolated (05/27/2023)   Received from [facility]   Social Connection and Isolation Panel   . In a typical week, how many times do you talk on the phone with family, friends, or neighbors?: More than three times a week   . How often do you get together with friends or relatives?: More than three times a week   . How often do you attend church or religious services?: More than 4 times per year   . Do you belong to any clubs or organizations such as church groups, unions, fraternal or athletic groups, or school groups?: No   . How often do you attend meetings of the clubs or organizations you belong to?: Never   . Are you married, widowed, divorced, separated, never married, or living with a partner?: Widowed  Physicist, medical  Strain: Low Risk  (07/07/2023)   Overall Financial Resource Strain (CARDIA)   . Difficulty of Paying Living Expenses: Not hard at all  Health Literacy: Not on file  Internet Connectivity: Not on file    Complex Discharge Information  Is patient identified as a difficult/complex discharge?: No    Discharge Needs Assessment Concerns to be Addressed: discharge planning, care coordination/care conferences  Clinical Risk Factors: > 65, Multiple Diagnoses (Chronic), Poor Health Literacy  Barriers to taking medications: No  Prior overnight hospital stay or ED visit in last 90 days: Yes    Anticipated Changes Related to Illness: other (see comments) (Likely will need time to recover to resume prior level of functioning.)  Equipment Needed After Discharge: other (see comments) (CM will follow for DME needs.)  Discharge Facility/Level of Care Needs: other (see comments) (CM will follow for DC needs.)  Readmission Risk of Unplanned Readmission Score: UNPLANNED READMISSION SCORE: 13.52% Predictive Model Details        14% (Medium)  Factor Value   Calculated 07/07/2023 12:04 33%  Number of active inpatient medication orders 37   [facility] Risk of Unplanned Readmission Model 12% ECG/EKG order present in last 6 months    12% Latest calcium  low (8.6 mg/dL)    8% Imaging order present in last 6 months    8% Age 15    8% Latest hemoglobin low (11.3 g/dL)    7% Number of ED visits in last six months 1    6% Active anticoagulant inpatient medication order present    3% Charlson Comorbidity Index 2    3% Future appointment scheduled    2% Current length of stay 1.615 days    Readmitted Within the Last 30 Days? (No if blank)  Patient at risk for readmission?: Yes  Discharge Plan Screen findings are: Care Manager reviewed the plan of the patient's care with the Multidisciplinary Team. No discharge planning needs identified at this time. Care Manager will continue to manage plan and monitor  patient's progress with the team.  Expected Discharge Date: 07/11/2023  Expected Transfer from Critical Care:    Quality data for continuing care services shared with patient and/or representative?: N/A Patient and/or family were provided with choice of facilities / services that are available and appropriate to meet post hospital care needs?: Other (Comment) (CM will provide choice of facilities/services if deemed medically necessary.)    Initial Assessment complete?: Yes

## 2023-07-08 DIAGNOSIS — B9562 Methicillin resistant Staphylococcus aureus infection as the cause of diseases classified elsewhere: Secondary | ICD-10-CM | POA: Diagnosis not present

## 2023-07-08 DIAGNOSIS — R7881 Bacteremia: Secondary | ICD-10-CM | POA: Diagnosis not present

## 2023-07-08 DIAGNOSIS — E11621 Type 2 diabetes mellitus with foot ulcer: Secondary | ICD-10-CM | POA: Diagnosis not present

## 2023-07-08 DIAGNOSIS — L97518 Non-pressure chronic ulcer of other part of right foot with other specified severity: Secondary | ICD-10-CM | POA: Diagnosis not present

## 2023-07-08 DIAGNOSIS — I330 Acute and subacute infective endocarditis: Secondary | ICD-10-CM | POA: Diagnosis not present

## 2023-07-09 DIAGNOSIS — L97519 Non-pressure chronic ulcer of other part of right foot with unspecified severity: Secondary | ICD-10-CM | POA: Diagnosis not present

## 2023-07-09 DIAGNOSIS — B9562 Methicillin resistant Staphylococcus aureus infection as the cause of diseases classified elsewhere: Secondary | ICD-10-CM | POA: Diagnosis not present

## 2023-07-09 DIAGNOSIS — R7881 Bacteremia: Secondary | ICD-10-CM | POA: Diagnosis not present

## 2023-07-09 DIAGNOSIS — E11621 Type 2 diabetes mellitus with foot ulcer: Secondary | ICD-10-CM | POA: Diagnosis not present

## 2023-07-10 DIAGNOSIS — M47817 Spondylosis without myelopathy or radiculopathy, lumbosacral region: Secondary | ICD-10-CM | POA: Diagnosis not present

## 2023-07-10 DIAGNOSIS — M47816 Spondylosis without myelopathy or radiculopathy, lumbar region: Secondary | ICD-10-CM | POA: Diagnosis not present

## 2023-07-11 DIAGNOSIS — B9562 Methicillin resistant Staphylococcus aureus infection as the cause of diseases classified elsewhere: Secondary | ICD-10-CM | POA: Diagnosis not present

## 2023-07-11 DIAGNOSIS — E11621 Type 2 diabetes mellitus with foot ulcer: Secondary | ICD-10-CM | POA: Diagnosis not present

## 2023-07-11 DIAGNOSIS — R7881 Bacteremia: Secondary | ICD-10-CM | POA: Diagnosis not present

## 2023-07-11 DIAGNOSIS — L97519 Non-pressure chronic ulcer of other part of right foot with unspecified severity: Secondary | ICD-10-CM | POA: Diagnosis not present

## 2023-07-11 NOTE — Care Plan (Signed)
 Faxed AIR Referral to minimum of 3 zip codes from desired discharge area and including all 3 [facility] Facilities as requested via epic Alfonso Funk July 11, 2023 3:32 PM

## 2023-07-12 DIAGNOSIS — S12691D Other nondisplaced fracture of seventh cervical vertebra, subsequent encounter for fracture with routine healing: Secondary | ICD-10-CM | POA: Diagnosis not present

## 2023-07-12 DIAGNOSIS — I10 Essential (primary) hypertension: Secondary | ICD-10-CM | POA: Diagnosis not present

## 2023-07-12 DIAGNOSIS — E11621 Type 2 diabetes mellitus with foot ulcer: Secondary | ICD-10-CM | POA: Diagnosis not present

## 2023-07-12 DIAGNOSIS — R7881 Bacteremia: Secondary | ICD-10-CM | POA: Diagnosis not present

## 2023-07-12 DIAGNOSIS — S2231XD Fracture of one rib, right side, subsequent encounter for fracture with routine healing: Secondary | ICD-10-CM | POA: Diagnosis not present

## 2023-07-12 DIAGNOSIS — B9562 Methicillin resistant Staphylococcus aureus infection as the cause of diseases classified elsewhere: Secondary | ICD-10-CM | POA: Diagnosis not present

## 2023-07-12 DIAGNOSIS — E119 Type 2 diabetes mellitus without complications: Secondary | ICD-10-CM | POA: Diagnosis not present

## 2023-07-12 DIAGNOSIS — L97519 Non-pressure chronic ulcer of other part of right foot with unspecified severity: Secondary | ICD-10-CM | POA: Diagnosis not present

## 2023-07-12 DIAGNOSIS — Z89412 Acquired absence of left great toe: Secondary | ICD-10-CM | POA: Diagnosis not present

## 2023-07-12 NOTE — Nursing Note (Signed)
 OCCUPATIONAL THERAPY Treatment Note (07/12/23 0856)  Patient Name: Jason [name]  Medical Record Number: [id number]  Date of Birth: 09/16/1945 Sex: Male   Post-Discharge Occupational Therapy Recommendations: 5x weekly, High intensity       Equipment Recommendation OT DME Recommendations: Defer to post acute, Tub Transfer Bench   Activity Tolerance During Today's Session Tolerated treatment well  OT Treatment Diagnosis: Generalized muscle weakness, Unsteadiness on feet, Need for assistance with personal care      ASSESSMENT  Session Summary:    Assessment: Pt presents to OT this session with improvements in functional activity tolerance, adherence to RLE heel weight bearing precautions, and participation in ADLs. Pt engaging in bed mobility, LBD, seated grooming, EOB, BSC, and recliner t/f with up to Max Assist using RW. Pt demonstrated improved adherence to RLE heel weight-bearing precautions, facilitated by the use of crackers taped to the RLE forefoot as a form of multimodal cueing this date. Pt continues to be limited by generalized weakness, orthopedic restrictions, and decreased endurance impacting BADL. Pt will continue to benefit from skilled acute OT to maximize safety/independence with ADLs/functional mobility. OT continuing to recommend 5x/weekly (high) at discharge.       Today's Interventions: Pt educated on role of OT, POC, importance & benefits of early & continued engagement in ADLs, safety with mobility, motivational interviewing, dispo recs, preparatory tasks for functional transfers, weight bearing precautions & implications during ADLs, figure four positioning for LB ADLs, mobility options to maintain precautions with bed mobility, answered all questions from pt/family within scope, positioning for edema management, therapeutic use of self to encourage pt to participate in ADLs, therapeutic listening, energy conservation, cues for hand placement during transfer,  safe functional mobility using AD, and call don't fall.   Problem List: Decreased strength, Decreased range of motion, Impaired ADLs, Fall risk, Decreased skin integrity, Decreased mobility, Orthopedic restrictions, Decreased activity tolerance, Impaired balance, Decreased endurance    PLAN  Planned Frequency of Treatment: Plan of Care Initiated: 07/06/23 1-2x per day Weekly Frequency: 3-4 days per week Planned Treatment Duration: 07/27/23  Planned Interventions:  Education (Patient/Family/Caregiver), Self-Care/Home Training, Therapeutic Exercise, Therapeutic Activity, Home Exercise Program    Prognosis Prognosis: Good Positive Indicators: Motivation Barriers to Discharge: Inability to safely perform ADLS, Endurance deficits, Functional strength deficits, Impaired Balance, Severity of deficits  SUBJECTIVE Communication Preference: Verbal,     Patient / Caregiver reports: I need my shoes. I can't do it without slipping in these Equipment / Environment: Vascular access (PIV, TLC, Port-a-cath, PICC), Purewick/Condom catheter  Prior functional status: PTA pt reports being Mod I for all ADLs and functional mobility without use of assistive devices. He performs most IADL tasks; however, his family also assists with this as needed. He reports 2 recent falls in the last 3 months, stating he tripped over things and this last fall was a bad one where he needed assistance to get up. He enjoys watching sports specifically football and baseball. He works at [employer] in [employer] but states he has been on a leave since the middle of May d/t his health.  Living Situation Living Environment: House Lives With: Family Home Living: Two level home, Able to Live on main level with bedroom/bathroom, Stairs to enter without rails, Hand-held shower hose, Handicapped height toilet, Tub/shower unit Number of Stairs to Enter (outside): 1 Caregiver Identified?: Yes Caregiver Availability:  Intermittent Caregiver Ability: Limited lifting  Equipment available at home: None   OBJECTIVE  Goals: Patient and Family Goals: None  stated at time of evaluation.  SHORT GOAL #1: Pt will complete all aspects of toileting tasks with Mod I using LRAD while maintaining WB precautions for RLE.  Date Established : 07/06/23  Time Frame : 2 weeks  Daily Interventions: Pt completed semi-supine>sit EOB = Mod A for BLE guidance with HOB elevated and LUE bed rail use, maintaining static and dynamic seated balance at EOB ~5 minutes = CGA-SBA. Pt performing EOB STS = Max A from elevated bed height + RW + mod multimodal cues for hand placement and nose over toes after x3 unsuccessful attempts with transition of RUE from bed to RW with erect posture. Pt performing SPT to the L from EOB>BSC = Min-Mod A for balance and eccentric control + RW + Min multimodal cues for hand placement. Pt maintaining seated balance on BSC to void = SBA before BSC STS = Max A + RW + Mod multimodal cues for hand placement, SPT to the R from BSC>recliner = Min-Mod A + RW with fair-poor eccentric control noted. Pt demonstrating improved adherence to RLE heel weight bearing precautions as demonstrated by maintained integrity of 2 packs of crackers taped to RLE forefoot for multimodal cueing throughout mobility.  Plan : In Progress   SHORT GOAL #2: Pt will complete full body dressing with Mod I while using AE as needed.  Date Established : 07/06/23  Time Frame : 2 weeks   Daily Interventions : Pt donning LLE shoe seated EOB = CGA, donning sock over RLE (including crackers taped to bottom of forefoot) seated EOB = Min A to thread utilizing figure 4 positioning.  Plan : In Progress  SHORT GOAL #3: Pt will complete standing ADL tasks for >5 minutes with Mod I using LRAD while maintaining WB precautions for RLE.  Date Established : 07/06/23  Time Frame : 2 weeks   Daily Interventions : Pt engaged in seated grooming activity at EOB to  wash his face utilizing a wet wash cloth = SBA  Plan : In Progress    Long Term Goal #1: Pt will score 20+/24 on the AM-PAC. Time Frame: 3 weeks  Precautions: Falls precautions, Isolation precautions Precautions / Restrictions comments: contact; Per orders, c-spine cleared by ortho Weight Bearing Status: R Heel weight bearing (crackers taped to R forefoot for multimodal cues to maintain R heel weight bearing with good improvement and adherence) Required Braces or Orthoses: Non-applicable  Vitals/Orthostatics: BP 190/80(112), HR 71 semi-supine at rest, VSS on RA throughout  Family/Caregiver present: None present Pain Comments: 0/10 reported and RN present mid way through session to pass scheduled pain meds  Patient at end of session: All needs in reach, Alarm activated, In chair, Lines intact, Notified Nurse  Occupational Therapy Session Duration OT Individual [mins]: 39      AM-PAC-Daily Activity Lower Body Dressing assistance needs: A lot - Maximum/Moderate Assistance Bathing assistance needs: A lot - Maximum/Moderate Assistance Toileting assistance needs: A lot - Maximum/Moderate Assistance Upper Body Dressing assistance needs: A Little - Minimal/Contact Guard Assist/Supervision Personal Grooming assistance needs: A Little - Minimal/Contact Guard Assist/Supervision Eating Meals assistance needs: None - Modified Independent/Independent  Daily Activity Score: 16  Score (in points): % of Functional Impairment, Limitation, Restriction 6: 100% impaired, limited, restricted 7-8: At least 80%, but less than 100% impaired, limited restricted 9-13: At least 60%, but less than 80% impaired, limited restricted 14-19: At least 40%, but less than 60% impaired, limited restricted 20-22: At least 20%, but less than 40% impaired, limited restricted 23:  At least 1%, but less than 20% impaired, limited restricted 24: 0% impaired, limited restricted  I attest that I have reviewed the above  information. Signed: Tinnie JONELLE Mose, OT Filed 07/12/2023

## 2023-07-12 NOTE — BH Treatment Plan (Addendum)
 [facility] Infectious Diseases OPAT (Outpatient Parenteral Antimicrobial Therapy) Program Treatment Plan     This patient has been accepted into the [facility]'s OPAT program. Our program will be responsible for monitoring patient while on IV antimicrobial therapy and signing orders for safety monitoring labs, antimicrobials, and CVAD care.    Jason [name]  Referring Service/Physician: Lorelle ID Attending: Elon Lorelle ID Fellow: N/A  Primary ID Diagnosis/Diagnoses: MRSA bacteremia   Please refer to most recent ID consult note for relevant history, hospital course, ID medical decision making, and future considerations.   ID providers - Please place future outpatient imaging orders. Imaging will be scheduled by OPAT administrative assistant.     Primary Team/Case Manager: Please copy and paste below information into home infusion referral:   IV Antimicrobial #1 and Dose: daptomycin 600 mg Q 24 H IV Antimicrobial #2 and Dose: N/A PO Antimicrobials and Dose: N/A  Anaphylaxis kit to be dispensed as needed for first dosing at home  Start Date of therapy: 07/09/2023 Anticipated stop date for therapy: 08/06/2023   F/u Imaging (please include anticipated study):  #1- Right before end of treatment: N/A #2 -Imaging needed during treatment at another time: N/A  Requested weekly labs: First set of labs to be done at [facility] visit  - Mondays or Tuesdays: CBC with differential, comprehensive metabolic panel  and creatinine kinase (CK - for daptomycin)   -  Weekly labs should be obtained by home health nursing on Mondays or Tuesdays each week unless orders are conveyed to home infusion by OPAT team for labs to be obtained during the week of discharge.    IV access: PICC: Flush with 5 mLs of Normal Saline before and after each use and prn for line maintenance. Use after blood draws. Flush with Heparin  100units/mL 2 mLs at the end of each use and prn for line  maintenance. Please change needleless cap weekly and after blood draws, or prn using aseptic technique. Please flush unused lumen(s) with Heparin  100units/mL 2 mLs every day.   - Home health RN shall provide education to patient/caregiver on infusion protocol and line maintenance.  - Home health RN will change CVAD dressing/extension tubing, and obtain labs (may be pulled from central access device) at weekly intervals. CVAD dressing kit to include CHG Gel dressing or Biopatch. -  Microclave to be changed weekly with dressing changes, and after line is used for blood collection.     - Home health RN should provide weekly CVAD care until line is removed.  - Home infusion agency can provide 2 mg Cathflo (alteplase) x2 for CVAD occlusion as needed (per protocol). Verification of cost and nursing availability must be completed by infusion agency.  - Home Infusion agency to dispense Antiseptic Barrier Cap to attach after each use of IV line.   ID Provider Following : Elon Lorelle  Fax labs and OPAT orders to attention of: [facility] OPAT Program             OPAT Program Phone: [phone]  OPAT Program Fax:  [phone]   ID Provider listed will not be responsible for signing orders for care that is outside of the scope of the OPAT program including: PT/OT, wound care or wound vac orders.   Please ensure that an ancillary provider (primary care, surgery or wound clinic) is listed for these orders if indicated.    Follow-up ID Appointment will be arranged by OPAT Administrative Staff (do not send request to [facility]). If this  appointment needs to be rescheduled, please have the patient call [phone]

## 2023-07-13 DIAGNOSIS — L089 Local infection of the skin and subcutaneous tissue, unspecified: Secondary | ICD-10-CM | POA: Diagnosis not present

## 2023-07-13 DIAGNOSIS — E1151 Type 2 diabetes mellitus with diabetic peripheral angiopathy without gangrene: Secondary | ICD-10-CM | POA: Diagnosis not present

## 2023-07-13 DIAGNOSIS — E11621 Type 2 diabetes mellitus with foot ulcer: Secondary | ICD-10-CM | POA: Diagnosis not present

## 2023-07-13 DIAGNOSIS — R7881 Bacteremia: Secondary | ICD-10-CM | POA: Diagnosis not present

## 2023-07-13 DIAGNOSIS — B9562 Methicillin resistant Staphylococcus aureus infection as the cause of diseases classified elsewhere: Secondary | ICD-10-CM | POA: Diagnosis not present

## 2023-07-13 DIAGNOSIS — L97518 Non-pressure chronic ulcer of other part of right foot with other specified severity: Secondary | ICD-10-CM | POA: Diagnosis not present

## 2023-07-13 DIAGNOSIS — L97419 Non-pressure chronic ulcer of right heel and midfoot with unspecified severity: Secondary | ICD-10-CM | POA: Diagnosis not present

## 2023-07-14 DIAGNOSIS — E11621 Type 2 diabetes mellitus with foot ulcer: Secondary | ICD-10-CM | POA: Diagnosis not present

## 2023-07-14 DIAGNOSIS — L089 Local infection of the skin and subcutaneous tissue, unspecified: Secondary | ICD-10-CM | POA: Diagnosis not present

## 2023-07-14 DIAGNOSIS — R7881 Bacteremia: Secondary | ICD-10-CM | POA: Diagnosis not present

## 2023-07-14 DIAGNOSIS — L97518 Non-pressure chronic ulcer of other part of right foot with other specified severity: Secondary | ICD-10-CM | POA: Diagnosis not present

## 2023-07-14 DIAGNOSIS — B9562 Methicillin resistant Staphylococcus aureus infection as the cause of diseases classified elsewhere: Secondary | ICD-10-CM | POA: Diagnosis not present

## 2023-07-15 DIAGNOSIS — E11621 Type 2 diabetes mellitus with foot ulcer: Secondary | ICD-10-CM | POA: Diagnosis not present

## 2023-07-15 DIAGNOSIS — L089 Local infection of the skin and subcutaneous tissue, unspecified: Secondary | ICD-10-CM | POA: Diagnosis not present

## 2023-07-15 DIAGNOSIS — K5900 Constipation, unspecified: Secondary | ICD-10-CM | POA: Diagnosis not present

## 2023-07-15 DIAGNOSIS — L97518 Non-pressure chronic ulcer of other part of right foot with other specified severity: Secondary | ICD-10-CM | POA: Diagnosis not present

## 2023-07-15 DIAGNOSIS — R7881 Bacteremia: Secondary | ICD-10-CM | POA: Diagnosis not present

## 2023-07-15 DIAGNOSIS — B9562 Methicillin resistant Staphylococcus aureus infection as the cause of diseases classified elsewhere: Secondary | ICD-10-CM | POA: Diagnosis not present

## 2023-07-16 DIAGNOSIS — R7881 Bacteremia: Secondary | ICD-10-CM | POA: Diagnosis not present

## 2023-07-16 DIAGNOSIS — E11621 Type 2 diabetes mellitus with foot ulcer: Secondary | ICD-10-CM | POA: Diagnosis not present

## 2023-07-16 DIAGNOSIS — B9562 Methicillin resistant Staphylococcus aureus infection as the cause of diseases classified elsewhere: Secondary | ICD-10-CM | POA: Diagnosis not present

## 2023-07-16 DIAGNOSIS — L97518 Non-pressure chronic ulcer of other part of right foot with other specified severity: Secondary | ICD-10-CM | POA: Diagnosis not present

## 2023-07-17 DIAGNOSIS — R7881 Bacteremia: Secondary | ICD-10-CM | POA: Diagnosis not present

## 2023-07-17 DIAGNOSIS — E11628 Type 2 diabetes mellitus with other skin complications: Secondary | ICD-10-CM | POA: Diagnosis not present

## 2023-07-17 DIAGNOSIS — I739 Peripheral vascular disease, unspecified: Secondary | ICD-10-CM | POA: Diagnosis not present

## 2023-07-17 DIAGNOSIS — B9562 Methicillin resistant Staphylococcus aureus infection as the cause of diseases classified elsewhere: Secondary | ICD-10-CM | POA: Diagnosis not present

## 2023-07-17 DIAGNOSIS — L97518 Non-pressure chronic ulcer of other part of right foot with other specified severity: Secondary | ICD-10-CM | POA: Diagnosis not present

## 2023-07-17 DIAGNOSIS — Z89412 Acquired absence of left great toe: Secondary | ICD-10-CM | POA: Diagnosis not present

## 2023-07-18 DIAGNOSIS — E11621 Type 2 diabetes mellitus with foot ulcer: Secondary | ICD-10-CM | POA: Diagnosis not present

## 2023-07-18 DIAGNOSIS — R7401 Elevation of levels of liver transaminase levels: Secondary | ICD-10-CM | POA: Diagnosis not present

## 2023-07-18 DIAGNOSIS — R7881 Bacteremia: Secondary | ICD-10-CM | POA: Diagnosis not present

## 2023-07-18 DIAGNOSIS — L97519 Non-pressure chronic ulcer of other part of right foot with unspecified severity: Secondary | ICD-10-CM | POA: Diagnosis not present

## 2023-07-18 DIAGNOSIS — Z794 Long term (current) use of insulin: Secondary | ICD-10-CM | POA: Diagnosis not present

## 2023-07-18 DIAGNOSIS — B9562 Methicillin resistant Staphylococcus aureus infection as the cause of diseases classified elsewhere: Secondary | ICD-10-CM | POA: Diagnosis not present

## 2023-07-21 DIAGNOSIS — E11621 Type 2 diabetes mellitus with foot ulcer: Secondary | ICD-10-CM | POA: Diagnosis not present

## 2023-07-21 DIAGNOSIS — K5900 Constipation, unspecified: Secondary | ICD-10-CM | POA: Diagnosis not present

## 2023-07-21 DIAGNOSIS — M6281 Muscle weakness (generalized): Secondary | ICD-10-CM | POA: Diagnosis not present

## 2023-07-21 DIAGNOSIS — I639 Cerebral infarction, unspecified: Secondary | ICD-10-CM | POA: Diagnosis not present

## 2023-07-21 DIAGNOSIS — L97519 Non-pressure chronic ulcer of other part of right foot with unspecified severity: Secondary | ICD-10-CM | POA: Diagnosis not present

## 2023-07-21 DIAGNOSIS — Z452 Encounter for adjustment and management of vascular access device: Secondary | ICD-10-CM | POA: Diagnosis not present

## 2023-07-21 DIAGNOSIS — I119 Hypertensive heart disease without heart failure: Secondary | ICD-10-CM | POA: Diagnosis not present

## 2023-07-21 DIAGNOSIS — E1151 Type 2 diabetes mellitus with diabetic peripheral angiopathy without gangrene: Secondary | ICD-10-CM | POA: Diagnosis not present

## 2023-07-21 DIAGNOSIS — E785 Hyperlipidemia, unspecified: Secondary | ICD-10-CM | POA: Diagnosis not present

## 2023-07-21 DIAGNOSIS — R279 Unspecified lack of coordination: Secondary | ICD-10-CM | POA: Diagnosis not present

## 2023-07-21 DIAGNOSIS — L97909 Non-pressure chronic ulcer of unspecified part of unspecified lower leg with unspecified severity: Secondary | ICD-10-CM | POA: Diagnosis not present

## 2023-07-21 DIAGNOSIS — E1169 Type 2 diabetes mellitus with other specified complication: Secondary | ICD-10-CM | POA: Diagnosis not present

## 2023-07-21 DIAGNOSIS — M869 Osteomyelitis, unspecified: Secondary | ICD-10-CM | POA: Diagnosis not present

## 2023-07-21 DIAGNOSIS — L97512 Non-pressure chronic ulcer of other part of right foot with fat layer exposed: Secondary | ICD-10-CM | POA: Diagnosis not present

## 2023-07-21 DIAGNOSIS — S12691D Other nondisplaced fracture of seventh cervical vertebra, subsequent encounter for fracture with routine healing: Secondary | ICD-10-CM | POA: Diagnosis not present

## 2023-07-21 DIAGNOSIS — A419 Sepsis, unspecified organism: Secondary | ICD-10-CM | POA: Diagnosis not present

## 2023-07-21 DIAGNOSIS — R5381 Other malaise: Secondary | ICD-10-CM | POA: Diagnosis not present

## 2023-07-21 DIAGNOSIS — I10 Essential (primary) hypertension: Secondary | ICD-10-CM | POA: Diagnosis not present

## 2023-07-21 DIAGNOSIS — I739 Peripheral vascular disease, unspecified: Secondary | ICD-10-CM | POA: Diagnosis not present

## 2023-07-21 DIAGNOSIS — S12601D Unspecified nondisplaced fracture of seventh cervical vertebra, subsequent encounter for fracture with routine healing: Secondary | ICD-10-CM | POA: Diagnosis not present

## 2023-07-21 DIAGNOSIS — S2231XD Fracture of one rib, right side, subsequent encounter for fracture with routine healing: Secondary | ICD-10-CM | POA: Diagnosis not present

## 2023-07-21 DIAGNOSIS — L858 Other specified epidermal thickening: Secondary | ICD-10-CM | POA: Diagnosis not present

## 2023-07-21 DIAGNOSIS — E1152 Type 2 diabetes mellitus with diabetic peripheral angiopathy with gangrene: Secondary | ICD-10-CM | POA: Diagnosis not present

## 2023-07-21 DIAGNOSIS — Z794 Long term (current) use of insulin: Secondary | ICD-10-CM | POA: Diagnosis not present

## 2023-07-21 DIAGNOSIS — A4902 Methicillin resistant Staphylococcus aureus infection, unspecified site: Secondary | ICD-10-CM | POA: Diagnosis not present

## 2023-07-21 DIAGNOSIS — R7881 Bacteremia: Secondary | ICD-10-CM | POA: Diagnosis not present

## 2023-07-21 DIAGNOSIS — B9562 Methicillin resistant Staphylococcus aureus infection as the cause of diseases classified elsewhere: Secondary | ICD-10-CM | POA: Diagnosis not present

## 2023-07-21 DIAGNOSIS — A499 Bacterial infection, unspecified: Secondary | ICD-10-CM | POA: Diagnosis not present

## 2023-07-21 DIAGNOSIS — I2510 Atherosclerotic heart disease of native coronary artery without angina pectoris: Secondary | ICD-10-CM | POA: Diagnosis not present

## 2023-07-21 DIAGNOSIS — W19XXXD Unspecified fall, subsequent encounter: Secondary | ICD-10-CM | POA: Diagnosis not present

## 2023-07-22 DIAGNOSIS — I2510 Atherosclerotic heart disease of native coronary artery without angina pectoris: Secondary | ICD-10-CM | POA: Diagnosis not present

## 2023-07-22 DIAGNOSIS — I739 Peripheral vascular disease, unspecified: Secondary | ICD-10-CM | POA: Diagnosis not present

## 2023-07-22 DIAGNOSIS — E11621 Type 2 diabetes mellitus with foot ulcer: Secondary | ICD-10-CM | POA: Diagnosis not present

## 2023-07-22 DIAGNOSIS — E785 Hyperlipidemia, unspecified: Secondary | ICD-10-CM | POA: Diagnosis not present

## 2023-07-22 DIAGNOSIS — S2231XD Fracture of one rib, right side, subsequent encounter for fracture with routine healing: Secondary | ICD-10-CM | POA: Diagnosis not present

## 2023-07-22 DIAGNOSIS — S12691D Other nondisplaced fracture of seventh cervical vertebra, subsequent encounter for fracture with routine healing: Secondary | ICD-10-CM | POA: Diagnosis not present

## 2023-07-22 DIAGNOSIS — I119 Hypertensive heart disease without heart failure: Secondary | ICD-10-CM | POA: Diagnosis not present

## 2023-07-22 DIAGNOSIS — A4902 Methicillin resistant Staphylococcus aureus infection, unspecified site: Secondary | ICD-10-CM | POA: Diagnosis not present

## 2023-07-22 DIAGNOSIS — K5900 Constipation, unspecified: Secondary | ICD-10-CM | POA: Diagnosis not present

## 2023-07-25 DIAGNOSIS — K5900 Constipation, unspecified: Secondary | ICD-10-CM | POA: Diagnosis not present

## 2023-07-25 DIAGNOSIS — S2231XD Fracture of one rib, right side, subsequent encounter for fracture with routine healing: Secondary | ICD-10-CM | POA: Diagnosis not present

## 2023-07-25 DIAGNOSIS — I2510 Atherosclerotic heart disease of native coronary artery without angina pectoris: Secondary | ICD-10-CM | POA: Diagnosis not present

## 2023-07-25 DIAGNOSIS — A4902 Methicillin resistant Staphylococcus aureus infection, unspecified site: Secondary | ICD-10-CM | POA: Diagnosis not present

## 2023-07-25 DIAGNOSIS — E785 Hyperlipidemia, unspecified: Secondary | ICD-10-CM | POA: Diagnosis not present

## 2023-07-25 DIAGNOSIS — I119 Hypertensive heart disease without heart failure: Secondary | ICD-10-CM | POA: Diagnosis not present

## 2023-07-25 DIAGNOSIS — S12691D Other nondisplaced fracture of seventh cervical vertebra, subsequent encounter for fracture with routine healing: Secondary | ICD-10-CM | POA: Diagnosis not present

## 2023-07-25 DIAGNOSIS — E11621 Type 2 diabetes mellitus with foot ulcer: Secondary | ICD-10-CM | POA: Diagnosis not present

## 2023-07-25 DIAGNOSIS — I739 Peripheral vascular disease, unspecified: Secondary | ICD-10-CM | POA: Diagnosis not present

## 2023-07-27 DIAGNOSIS — S2231XD Fracture of one rib, right side, subsequent encounter for fracture with routine healing: Secondary | ICD-10-CM | POA: Diagnosis not present

## 2023-07-27 DIAGNOSIS — A419 Sepsis, unspecified organism: Secondary | ICD-10-CM | POA: Diagnosis not present

## 2023-07-27 DIAGNOSIS — I739 Peripheral vascular disease, unspecified: Secondary | ICD-10-CM | POA: Diagnosis not present

## 2023-07-27 DIAGNOSIS — I639 Cerebral infarction, unspecified: Secondary | ICD-10-CM | POA: Diagnosis not present

## 2023-07-27 DIAGNOSIS — E11621 Type 2 diabetes mellitus with foot ulcer: Secondary | ICD-10-CM | POA: Diagnosis not present

## 2023-07-27 DIAGNOSIS — L858 Other specified epidermal thickening: Secondary | ICD-10-CM | POA: Diagnosis not present

## 2023-07-27 DIAGNOSIS — L97512 Non-pressure chronic ulcer of other part of right foot with fat layer exposed: Secondary | ICD-10-CM | POA: Diagnosis not present

## 2023-07-27 DIAGNOSIS — I2510 Atherosclerotic heart disease of native coronary artery without angina pectoris: Secondary | ICD-10-CM | POA: Diagnosis not present

## 2023-07-27 DIAGNOSIS — E785 Hyperlipidemia, unspecified: Secondary | ICD-10-CM | POA: Diagnosis not present

## 2023-07-27 DIAGNOSIS — I119 Hypertensive heart disease without heart failure: Secondary | ICD-10-CM | POA: Diagnosis not present

## 2023-07-27 DIAGNOSIS — K5900 Constipation, unspecified: Secondary | ICD-10-CM | POA: Diagnosis not present

## 2023-07-27 DIAGNOSIS — A4902 Methicillin resistant Staphylococcus aureus infection, unspecified site: Secondary | ICD-10-CM | POA: Diagnosis not present

## 2023-07-29 DIAGNOSIS — S12691D Other nondisplaced fracture of seventh cervical vertebra, subsequent encounter for fracture with routine healing: Secondary | ICD-10-CM | POA: Diagnosis not present

## 2023-07-29 DIAGNOSIS — A4902 Methicillin resistant Staphylococcus aureus infection, unspecified site: Secondary | ICD-10-CM | POA: Diagnosis not present

## 2023-07-29 DIAGNOSIS — E11621 Type 2 diabetes mellitus with foot ulcer: Secondary | ICD-10-CM | POA: Diagnosis not present

## 2023-07-29 DIAGNOSIS — I739 Peripheral vascular disease, unspecified: Secondary | ICD-10-CM | POA: Diagnosis not present

## 2023-07-29 DIAGNOSIS — S2231XD Fracture of one rib, right side, subsequent encounter for fracture with routine healing: Secondary | ICD-10-CM | POA: Diagnosis not present

## 2023-07-29 DIAGNOSIS — I119 Hypertensive heart disease without heart failure: Secondary | ICD-10-CM | POA: Diagnosis not present

## 2023-07-29 DIAGNOSIS — K5900 Constipation, unspecified: Secondary | ICD-10-CM | POA: Diagnosis not present

## 2023-07-29 DIAGNOSIS — E785 Hyperlipidemia, unspecified: Secondary | ICD-10-CM | POA: Diagnosis not present

## 2023-07-29 DIAGNOSIS — I2510 Atherosclerotic heart disease of native coronary artery without angina pectoris: Secondary | ICD-10-CM | POA: Diagnosis not present

## 2023-08-01 DIAGNOSIS — E785 Hyperlipidemia, unspecified: Secondary | ICD-10-CM | POA: Diagnosis not present

## 2023-08-01 DIAGNOSIS — I2510 Atherosclerotic heart disease of native coronary artery without angina pectoris: Secondary | ICD-10-CM | POA: Diagnosis not present

## 2023-08-01 DIAGNOSIS — S2231XD Fracture of one rib, right side, subsequent encounter for fracture with routine healing: Secondary | ICD-10-CM | POA: Diagnosis not present

## 2023-08-01 DIAGNOSIS — I119 Hypertensive heart disease without heart failure: Secondary | ICD-10-CM | POA: Diagnosis not present

## 2023-08-01 DIAGNOSIS — E11621 Type 2 diabetes mellitus with foot ulcer: Secondary | ICD-10-CM | POA: Diagnosis not present

## 2023-08-01 DIAGNOSIS — S12691D Other nondisplaced fracture of seventh cervical vertebra, subsequent encounter for fracture with routine healing: Secondary | ICD-10-CM | POA: Diagnosis not present

## 2023-08-01 DIAGNOSIS — K5900 Constipation, unspecified: Secondary | ICD-10-CM | POA: Diagnosis not present

## 2023-08-01 DIAGNOSIS — A4902 Methicillin resistant Staphylococcus aureus infection, unspecified site: Secondary | ICD-10-CM | POA: Diagnosis not present

## 2023-08-01 DIAGNOSIS — I739 Peripheral vascular disease, unspecified: Secondary | ICD-10-CM | POA: Diagnosis not present

## 2023-08-03 DIAGNOSIS — E11621 Type 2 diabetes mellitus with foot ulcer: Secondary | ICD-10-CM | POA: Diagnosis not present

## 2023-08-03 DIAGNOSIS — L97512 Non-pressure chronic ulcer of other part of right foot with fat layer exposed: Secondary | ICD-10-CM | POA: Diagnosis not present

## 2023-08-03 DIAGNOSIS — I639 Cerebral infarction, unspecified: Secondary | ICD-10-CM | POA: Diagnosis not present

## 2023-08-05 DIAGNOSIS — E11621 Type 2 diabetes mellitus with foot ulcer: Secondary | ICD-10-CM | POA: Diagnosis not present

## 2023-08-05 DIAGNOSIS — I2510 Atherosclerotic heart disease of native coronary artery without angina pectoris: Secondary | ICD-10-CM | POA: Diagnosis not present

## 2023-08-05 DIAGNOSIS — I10 Essential (primary) hypertension: Secondary | ICD-10-CM | POA: Diagnosis not present

## 2023-08-05 DIAGNOSIS — B9562 Methicillin resistant Staphylococcus aureus infection as the cause of diseases classified elsewhere: Secondary | ICD-10-CM | POA: Diagnosis not present

## 2023-08-05 DIAGNOSIS — E1152 Type 2 diabetes mellitus with diabetic peripheral angiopathy with gangrene: Secondary | ICD-10-CM | POA: Diagnosis not present

## 2023-08-08 DIAGNOSIS — E785 Hyperlipidemia, unspecified: Secondary | ICD-10-CM | POA: Diagnosis not present

## 2023-08-08 DIAGNOSIS — S2231XD Fracture of one rib, right side, subsequent encounter for fracture with routine healing: Secondary | ICD-10-CM | POA: Diagnosis not present

## 2023-08-08 DIAGNOSIS — B9562 Methicillin resistant Staphylococcus aureus infection as the cause of diseases classified elsewhere: Secondary | ICD-10-CM | POA: Diagnosis not present

## 2023-08-08 DIAGNOSIS — I10 Essential (primary) hypertension: Secondary | ICD-10-CM | POA: Diagnosis not present

## 2023-08-08 DIAGNOSIS — K5900 Constipation, unspecified: Secondary | ICD-10-CM | POA: Diagnosis not present

## 2023-08-08 DIAGNOSIS — I2510 Atherosclerotic heart disease of native coronary artery without angina pectoris: Secondary | ICD-10-CM | POA: Diagnosis not present

## 2023-08-08 DIAGNOSIS — I739 Peripheral vascular disease, unspecified: Secondary | ICD-10-CM | POA: Diagnosis not present

## 2023-08-08 DIAGNOSIS — E1152 Type 2 diabetes mellitus with diabetic peripheral angiopathy with gangrene: Secondary | ICD-10-CM | POA: Diagnosis not present

## 2023-08-10 DIAGNOSIS — E11621 Type 2 diabetes mellitus with foot ulcer: Secondary | ICD-10-CM | POA: Diagnosis not present

## 2023-08-10 DIAGNOSIS — E1151 Type 2 diabetes mellitus with diabetic peripheral angiopathy without gangrene: Secondary | ICD-10-CM | POA: Diagnosis not present

## 2023-08-10 DIAGNOSIS — A4902 Methicillin resistant Staphylococcus aureus infection, unspecified site: Secondary | ICD-10-CM | POA: Diagnosis not present

## 2023-08-10 DIAGNOSIS — S2231XD Fracture of one rib, right side, subsequent encounter for fracture with routine healing: Secondary | ICD-10-CM | POA: Diagnosis not present

## 2023-08-10 DIAGNOSIS — I119 Hypertensive heart disease without heart failure: Secondary | ICD-10-CM | POA: Diagnosis not present

## 2023-08-10 DIAGNOSIS — L97511 Non-pressure chronic ulcer of other part of right foot limited to breakdown of skin: Secondary | ICD-10-CM | POA: Diagnosis not present

## 2023-08-10 DIAGNOSIS — L03115 Cellulitis of right lower limb: Secondary | ICD-10-CM | POA: Diagnosis not present

## 2023-08-10 DIAGNOSIS — S12691D Other nondisplaced fracture of seventh cervical vertebra, subsequent encounter for fracture with routine healing: Secondary | ICD-10-CM | POA: Diagnosis not present

## 2023-08-10 DIAGNOSIS — E1165 Type 2 diabetes mellitus with hyperglycemia: Secondary | ICD-10-CM | POA: Diagnosis not present

## 2023-08-16 DIAGNOSIS — E1151 Type 2 diabetes mellitus with diabetic peripheral angiopathy without gangrene: Secondary | ICD-10-CM | POA: Diagnosis not present

## 2023-08-16 DIAGNOSIS — E1165 Type 2 diabetes mellitus with hyperglycemia: Secondary | ICD-10-CM | POA: Diagnosis not present

## 2023-08-16 DIAGNOSIS — S12691D Other nondisplaced fracture of seventh cervical vertebra, subsequent encounter for fracture with routine healing: Secondary | ICD-10-CM | POA: Diagnosis not present

## 2023-08-16 DIAGNOSIS — S2231XD Fracture of one rib, right side, subsequent encounter for fracture with routine healing: Secondary | ICD-10-CM | POA: Diagnosis not present

## 2023-08-16 DIAGNOSIS — L03115 Cellulitis of right lower limb: Secondary | ICD-10-CM | POA: Diagnosis not present

## 2023-08-16 DIAGNOSIS — A4902 Methicillin resistant Staphylococcus aureus infection, unspecified site: Secondary | ICD-10-CM | POA: Diagnosis not present

## 2023-08-16 DIAGNOSIS — I119 Hypertensive heart disease without heart failure: Secondary | ICD-10-CM | POA: Diagnosis not present

## 2023-08-16 DIAGNOSIS — E11621 Type 2 diabetes mellitus with foot ulcer: Secondary | ICD-10-CM | POA: Diagnosis not present

## 2023-08-16 DIAGNOSIS — L97511 Non-pressure chronic ulcer of other part of right foot limited to breakdown of skin: Secondary | ICD-10-CM | POA: Diagnosis not present

## 2023-08-17 DIAGNOSIS — S12691D Other nondisplaced fracture of seventh cervical vertebra, subsequent encounter for fracture with routine healing: Secondary | ICD-10-CM | POA: Diagnosis not present

## 2023-08-17 DIAGNOSIS — L97511 Non-pressure chronic ulcer of other part of right foot limited to breakdown of skin: Secondary | ICD-10-CM | POA: Diagnosis not present

## 2023-08-17 DIAGNOSIS — E11621 Type 2 diabetes mellitus with foot ulcer: Secondary | ICD-10-CM | POA: Diagnosis not present

## 2023-08-17 DIAGNOSIS — E1165 Type 2 diabetes mellitus with hyperglycemia: Secondary | ICD-10-CM | POA: Diagnosis not present

## 2023-08-17 DIAGNOSIS — S2231XD Fracture of one rib, right side, subsequent encounter for fracture with routine healing: Secondary | ICD-10-CM | POA: Diagnosis not present

## 2023-08-17 DIAGNOSIS — A4902 Methicillin resistant Staphylococcus aureus infection, unspecified site: Secondary | ICD-10-CM | POA: Diagnosis not present

## 2023-08-17 DIAGNOSIS — E1151 Type 2 diabetes mellitus with diabetic peripheral angiopathy without gangrene: Secondary | ICD-10-CM | POA: Diagnosis not present

## 2023-08-17 DIAGNOSIS — I119 Hypertensive heart disease without heart failure: Secondary | ICD-10-CM | POA: Diagnosis not present

## 2023-08-17 DIAGNOSIS — L03115 Cellulitis of right lower limb: Secondary | ICD-10-CM | POA: Diagnosis not present

## 2023-08-18 DIAGNOSIS — I10 Essential (primary) hypertension: Secondary | ICD-10-CM | POA: Diagnosis not present

## 2023-08-18 DIAGNOSIS — Z1331 Encounter for screening for depression: Secondary | ICD-10-CM | POA: Diagnosis not present

## 2023-08-18 DIAGNOSIS — E1151 Type 2 diabetes mellitus with diabetic peripheral angiopathy without gangrene: Secondary | ICD-10-CM | POA: Diagnosis not present

## 2023-08-18 DIAGNOSIS — E11621 Type 2 diabetes mellitus with foot ulcer: Secondary | ICD-10-CM | POA: Diagnosis not present

## 2023-08-18 DIAGNOSIS — L97419 Non-pressure chronic ulcer of right heel and midfoot with unspecified severity: Secondary | ICD-10-CM | POA: Diagnosis not present

## 2023-08-18 DIAGNOSIS — Z89431 Acquired absence of right foot: Secondary | ICD-10-CM | POA: Diagnosis not present

## 2023-08-18 DIAGNOSIS — Z794 Long term (current) use of insulin: Secondary | ICD-10-CM | POA: Diagnosis not present

## 2023-08-19 DIAGNOSIS — R911 Solitary pulmonary nodule: Secondary | ICD-10-CM | POA: Diagnosis not present

## 2023-08-19 DIAGNOSIS — Z79899 Other long term (current) drug therapy: Secondary | ICD-10-CM | POA: Diagnosis not present

## 2023-08-19 DIAGNOSIS — R1110 Vomiting, unspecified: Secondary | ICD-10-CM | POA: Diagnosis not present

## 2023-08-19 DIAGNOSIS — Z7982 Long term (current) use of aspirin: Secondary | ICD-10-CM | POA: Diagnosis not present

## 2023-08-19 DIAGNOSIS — Z951 Presence of aortocoronary bypass graft: Secondary | ICD-10-CM | POA: Diagnosis not present

## 2023-08-19 DIAGNOSIS — I2510 Atherosclerotic heart disease of native coronary artery without angina pectoris: Secondary | ICD-10-CM | POA: Diagnosis not present

## 2023-08-19 DIAGNOSIS — S2231XD Fracture of one rib, right side, subsequent encounter for fracture with routine healing: Secondary | ICD-10-CM | POA: Diagnosis not present

## 2023-08-19 DIAGNOSIS — L97511 Non-pressure chronic ulcer of other part of right foot limited to breakdown of skin: Secondary | ICD-10-CM | POA: Diagnosis not present

## 2023-08-19 DIAGNOSIS — Z8673 Personal history of transient ischemic attack (TIA), and cerebral infarction without residual deficits: Secondary | ICD-10-CM | POA: Diagnosis not present

## 2023-08-19 DIAGNOSIS — L03115 Cellulitis of right lower limb: Secondary | ICD-10-CM | POA: Diagnosis not present

## 2023-08-19 DIAGNOSIS — E1151 Type 2 diabetes mellitus with diabetic peripheral angiopathy without gangrene: Secondary | ICD-10-CM | POA: Diagnosis not present

## 2023-08-19 DIAGNOSIS — E785 Hyperlipidemia, unspecified: Secondary | ICD-10-CM | POA: Diagnosis not present

## 2023-08-19 DIAGNOSIS — E1165 Type 2 diabetes mellitus with hyperglycemia: Secondary | ICD-10-CM | POA: Diagnosis not present

## 2023-08-19 DIAGNOSIS — R58 Hemorrhage, not elsewhere classified: Secondary | ICD-10-CM | POA: Diagnosis not present

## 2023-08-19 DIAGNOSIS — I10 Essential (primary) hypertension: Secondary | ICD-10-CM | POA: Diagnosis not present

## 2023-08-19 DIAGNOSIS — E119 Type 2 diabetes mellitus without complications: Secondary | ICD-10-CM | POA: Diagnosis not present

## 2023-08-19 DIAGNOSIS — R079 Chest pain, unspecified: Secondary | ICD-10-CM | POA: Diagnosis not present

## 2023-08-19 DIAGNOSIS — Z794 Long term (current) use of insulin: Secondary | ICD-10-CM | POA: Diagnosis not present

## 2023-08-19 DIAGNOSIS — I119 Hypertensive heart disease without heart failure: Secondary | ICD-10-CM | POA: Diagnosis not present

## 2023-08-19 DIAGNOSIS — Z7984 Long term (current) use of oral hypoglycemic drugs: Secondary | ICD-10-CM | POA: Diagnosis not present

## 2023-08-19 DIAGNOSIS — E11621 Type 2 diabetes mellitus with foot ulcer: Secondary | ICD-10-CM | POA: Diagnosis not present

## 2023-08-19 DIAGNOSIS — A4902 Methicillin resistant Staphylococcus aureus infection, unspecified site: Secondary | ICD-10-CM | POA: Diagnosis not present

## 2023-08-19 DIAGNOSIS — S12691D Other nondisplaced fracture of seventh cervical vertebra, subsequent encounter for fracture with routine healing: Secondary | ICD-10-CM | POA: Diagnosis not present

## 2023-08-22 DIAGNOSIS — S2231XD Fracture of one rib, right side, subsequent encounter for fracture with routine healing: Secondary | ICD-10-CM | POA: Diagnosis not present

## 2023-08-22 DIAGNOSIS — I119 Hypertensive heart disease without heart failure: Secondary | ICD-10-CM | POA: Diagnosis not present

## 2023-08-22 DIAGNOSIS — E1165 Type 2 diabetes mellitus with hyperglycemia: Secondary | ICD-10-CM | POA: Diagnosis not present

## 2023-08-22 DIAGNOSIS — E1151 Type 2 diabetes mellitus with diabetic peripheral angiopathy without gangrene: Secondary | ICD-10-CM | POA: Diagnosis not present

## 2023-08-22 DIAGNOSIS — L03115 Cellulitis of right lower limb: Secondary | ICD-10-CM | POA: Diagnosis not present

## 2023-08-22 DIAGNOSIS — A4902 Methicillin resistant Staphylococcus aureus infection, unspecified site: Secondary | ICD-10-CM | POA: Diagnosis not present

## 2023-08-22 DIAGNOSIS — E11621 Type 2 diabetes mellitus with foot ulcer: Secondary | ICD-10-CM | POA: Diagnosis not present

## 2023-08-22 DIAGNOSIS — S12691D Other nondisplaced fracture of seventh cervical vertebra, subsequent encounter for fracture with routine healing: Secondary | ICD-10-CM | POA: Diagnosis not present

## 2023-08-22 DIAGNOSIS — L97511 Non-pressure chronic ulcer of other part of right foot limited to breakdown of skin: Secondary | ICD-10-CM | POA: Diagnosis not present

## 2023-08-23 DIAGNOSIS — E11621 Type 2 diabetes mellitus with foot ulcer: Secondary | ICD-10-CM | POA: Diagnosis not present

## 2023-08-23 DIAGNOSIS — E1165 Type 2 diabetes mellitus with hyperglycemia: Secondary | ICD-10-CM | POA: Diagnosis not present

## 2023-08-23 DIAGNOSIS — S12691D Other nondisplaced fracture of seventh cervical vertebra, subsequent encounter for fracture with routine healing: Secondary | ICD-10-CM | POA: Diagnosis not present

## 2023-08-23 DIAGNOSIS — A4902 Methicillin resistant Staphylococcus aureus infection, unspecified site: Secondary | ICD-10-CM | POA: Diagnosis not present

## 2023-08-23 DIAGNOSIS — E1151 Type 2 diabetes mellitus with diabetic peripheral angiopathy without gangrene: Secondary | ICD-10-CM | POA: Diagnosis not present

## 2023-08-23 DIAGNOSIS — S2231XD Fracture of one rib, right side, subsequent encounter for fracture with routine healing: Secondary | ICD-10-CM | POA: Diagnosis not present

## 2023-08-23 DIAGNOSIS — L03115 Cellulitis of right lower limb: Secondary | ICD-10-CM | POA: Diagnosis not present

## 2023-08-23 DIAGNOSIS — L97511 Non-pressure chronic ulcer of other part of right foot limited to breakdown of skin: Secondary | ICD-10-CM | POA: Diagnosis not present

## 2023-08-23 DIAGNOSIS — I119 Hypertensive heart disease without heart failure: Secondary | ICD-10-CM | POA: Diagnosis not present

## 2023-08-24 DIAGNOSIS — L97511 Non-pressure chronic ulcer of other part of right foot limited to breakdown of skin: Secondary | ICD-10-CM | POA: Diagnosis not present

## 2023-08-24 DIAGNOSIS — L03115 Cellulitis of right lower limb: Secondary | ICD-10-CM | POA: Diagnosis not present

## 2023-08-24 DIAGNOSIS — S12691D Other nondisplaced fracture of seventh cervical vertebra, subsequent encounter for fracture with routine healing: Secondary | ICD-10-CM | POA: Diagnosis not present

## 2023-08-24 DIAGNOSIS — A4902 Methicillin resistant Staphylococcus aureus infection, unspecified site: Secondary | ICD-10-CM | POA: Diagnosis not present

## 2023-08-24 DIAGNOSIS — I119 Hypertensive heart disease without heart failure: Secondary | ICD-10-CM | POA: Diagnosis not present

## 2023-08-24 DIAGNOSIS — S2231XD Fracture of one rib, right side, subsequent encounter for fracture with routine healing: Secondary | ICD-10-CM | POA: Diagnosis not present

## 2023-08-24 DIAGNOSIS — E1165 Type 2 diabetes mellitus with hyperglycemia: Secondary | ICD-10-CM | POA: Diagnosis not present

## 2023-08-24 DIAGNOSIS — E1151 Type 2 diabetes mellitus with diabetic peripheral angiopathy without gangrene: Secondary | ICD-10-CM | POA: Diagnosis not present

## 2023-08-24 DIAGNOSIS — E11621 Type 2 diabetes mellitus with foot ulcer: Secondary | ICD-10-CM | POA: Diagnosis not present

## 2023-08-26 DIAGNOSIS — L03115 Cellulitis of right lower limb: Secondary | ICD-10-CM | POA: Diagnosis not present

## 2023-08-26 DIAGNOSIS — I119 Hypertensive heart disease without heart failure: Secondary | ICD-10-CM | POA: Diagnosis not present

## 2023-08-26 DIAGNOSIS — E1165 Type 2 diabetes mellitus with hyperglycemia: Secondary | ICD-10-CM | POA: Diagnosis not present

## 2023-08-26 DIAGNOSIS — S2231XD Fracture of one rib, right side, subsequent encounter for fracture with routine healing: Secondary | ICD-10-CM | POA: Diagnosis not present

## 2023-08-26 DIAGNOSIS — E11621 Type 2 diabetes mellitus with foot ulcer: Secondary | ICD-10-CM | POA: Diagnosis not present

## 2023-08-26 DIAGNOSIS — E1151 Type 2 diabetes mellitus with diabetic peripheral angiopathy without gangrene: Secondary | ICD-10-CM | POA: Diagnosis not present

## 2023-08-26 DIAGNOSIS — S12691D Other nondisplaced fracture of seventh cervical vertebra, subsequent encounter for fracture with routine healing: Secondary | ICD-10-CM | POA: Diagnosis not present

## 2023-08-26 DIAGNOSIS — L97511 Non-pressure chronic ulcer of other part of right foot limited to breakdown of skin: Secondary | ICD-10-CM | POA: Diagnosis not present

## 2023-08-26 DIAGNOSIS — A4902 Methicillin resistant Staphylococcus aureus infection, unspecified site: Secondary | ICD-10-CM | POA: Diagnosis not present

## 2023-08-29 DIAGNOSIS — L97512 Non-pressure chronic ulcer of other part of right foot with fat layer exposed: Secondary | ICD-10-CM | POA: Diagnosis not present

## 2023-08-29 DIAGNOSIS — E1142 Type 2 diabetes mellitus with diabetic polyneuropathy: Secondary | ICD-10-CM | POA: Diagnosis not present

## 2023-08-30 DIAGNOSIS — S12691D Other nondisplaced fracture of seventh cervical vertebra, subsequent encounter for fracture with routine healing: Secondary | ICD-10-CM | POA: Diagnosis not present

## 2023-08-30 DIAGNOSIS — I119 Hypertensive heart disease without heart failure: Secondary | ICD-10-CM | POA: Diagnosis not present

## 2023-08-30 DIAGNOSIS — A4902 Methicillin resistant Staphylococcus aureus infection, unspecified site: Secondary | ICD-10-CM | POA: Diagnosis not present

## 2023-08-30 DIAGNOSIS — E11621 Type 2 diabetes mellitus with foot ulcer: Secondary | ICD-10-CM | POA: Diagnosis not present

## 2023-08-30 DIAGNOSIS — S2231XD Fracture of one rib, right side, subsequent encounter for fracture with routine healing: Secondary | ICD-10-CM | POA: Diagnosis not present

## 2023-08-30 DIAGNOSIS — L97511 Non-pressure chronic ulcer of other part of right foot limited to breakdown of skin: Secondary | ICD-10-CM | POA: Diagnosis not present

## 2023-08-30 DIAGNOSIS — L03115 Cellulitis of right lower limb: Secondary | ICD-10-CM | POA: Diagnosis not present

## 2023-08-30 DIAGNOSIS — E1165 Type 2 diabetes mellitus with hyperglycemia: Secondary | ICD-10-CM | POA: Diagnosis not present

## 2023-08-30 DIAGNOSIS — E1151 Type 2 diabetes mellitus with diabetic peripheral angiopathy without gangrene: Secondary | ICD-10-CM | POA: Diagnosis not present

## 2023-08-31 DIAGNOSIS — L97511 Non-pressure chronic ulcer of other part of right foot limited to breakdown of skin: Secondary | ICD-10-CM | POA: Diagnosis not present

## 2023-08-31 DIAGNOSIS — E1151 Type 2 diabetes mellitus with diabetic peripheral angiopathy without gangrene: Secondary | ICD-10-CM | POA: Diagnosis not present

## 2023-08-31 DIAGNOSIS — S12691D Other nondisplaced fracture of seventh cervical vertebra, subsequent encounter for fracture with routine healing: Secondary | ICD-10-CM | POA: Diagnosis not present

## 2023-08-31 DIAGNOSIS — A4902 Methicillin resistant Staphylococcus aureus infection, unspecified site: Secondary | ICD-10-CM | POA: Diagnosis not present

## 2023-08-31 DIAGNOSIS — S2231XD Fracture of one rib, right side, subsequent encounter for fracture with routine healing: Secondary | ICD-10-CM | POA: Diagnosis not present

## 2023-08-31 DIAGNOSIS — E11621 Type 2 diabetes mellitus with foot ulcer: Secondary | ICD-10-CM | POA: Diagnosis not present

## 2023-08-31 DIAGNOSIS — I119 Hypertensive heart disease without heart failure: Secondary | ICD-10-CM | POA: Diagnosis not present

## 2023-08-31 DIAGNOSIS — E1165 Type 2 diabetes mellitus with hyperglycemia: Secondary | ICD-10-CM | POA: Diagnosis not present

## 2023-08-31 DIAGNOSIS — L03115 Cellulitis of right lower limb: Secondary | ICD-10-CM | POA: Diagnosis not present

## 2023-09-02 DIAGNOSIS — L03115 Cellulitis of right lower limb: Secondary | ICD-10-CM | POA: Diagnosis not present

## 2023-09-02 DIAGNOSIS — L97419 Non-pressure chronic ulcer of right heel and midfoot with unspecified severity: Secondary | ICD-10-CM | POA: Diagnosis not present

## 2023-09-02 DIAGNOSIS — E1151 Type 2 diabetes mellitus with diabetic peripheral angiopathy without gangrene: Secondary | ICD-10-CM | POA: Diagnosis not present

## 2023-09-02 DIAGNOSIS — S2231XD Fracture of one rib, right side, subsequent encounter for fracture with routine healing: Secondary | ICD-10-CM | POA: Diagnosis not present

## 2023-09-02 DIAGNOSIS — E11621 Type 2 diabetes mellitus with foot ulcer: Secondary | ICD-10-CM | POA: Diagnosis not present

## 2023-09-02 DIAGNOSIS — R1012 Left upper quadrant pain: Secondary | ICD-10-CM | POA: Diagnosis not present

## 2023-09-02 DIAGNOSIS — L97511 Non-pressure chronic ulcer of other part of right foot limited to breakdown of skin: Secondary | ICD-10-CM | POA: Diagnosis not present

## 2023-09-02 DIAGNOSIS — I119 Hypertensive heart disease without heart failure: Secondary | ICD-10-CM | POA: Diagnosis not present

## 2023-09-02 DIAGNOSIS — S12691D Other nondisplaced fracture of seventh cervical vertebra, subsequent encounter for fracture with routine healing: Secondary | ICD-10-CM | POA: Diagnosis not present

## 2023-09-02 DIAGNOSIS — A4902 Methicillin resistant Staphylococcus aureus infection, unspecified site: Secondary | ICD-10-CM | POA: Diagnosis not present

## 2023-09-02 DIAGNOSIS — E1165 Type 2 diabetes mellitus with hyperglycemia: Secondary | ICD-10-CM | POA: Diagnosis not present

## 2023-09-07 DIAGNOSIS — A4902 Methicillin resistant Staphylococcus aureus infection, unspecified site: Secondary | ICD-10-CM | POA: Diagnosis not present

## 2023-09-07 DIAGNOSIS — E11621 Type 2 diabetes mellitus with foot ulcer: Secondary | ICD-10-CM | POA: Diagnosis not present

## 2023-09-07 DIAGNOSIS — E1165 Type 2 diabetes mellitus with hyperglycemia: Secondary | ICD-10-CM | POA: Diagnosis not present

## 2023-09-07 DIAGNOSIS — L03115 Cellulitis of right lower limb: Secondary | ICD-10-CM | POA: Diagnosis not present

## 2023-09-07 DIAGNOSIS — E1151 Type 2 diabetes mellitus with diabetic peripheral angiopathy without gangrene: Secondary | ICD-10-CM | POA: Diagnosis not present

## 2023-09-07 DIAGNOSIS — I119 Hypertensive heart disease without heart failure: Secondary | ICD-10-CM | POA: Diagnosis not present

## 2023-09-07 DIAGNOSIS — S2231XD Fracture of one rib, right side, subsequent encounter for fracture with routine healing: Secondary | ICD-10-CM | POA: Diagnosis not present

## 2023-09-07 DIAGNOSIS — L97511 Non-pressure chronic ulcer of other part of right foot limited to breakdown of skin: Secondary | ICD-10-CM | POA: Diagnosis not present

## 2023-09-07 DIAGNOSIS — S12691D Other nondisplaced fracture of seventh cervical vertebra, subsequent encounter for fracture with routine healing: Secondary | ICD-10-CM | POA: Diagnosis not present

## 2023-09-08 DIAGNOSIS — L03115 Cellulitis of right lower limb: Secondary | ICD-10-CM | POA: Diagnosis not present

## 2023-09-08 DIAGNOSIS — E11621 Type 2 diabetes mellitus with foot ulcer: Secondary | ICD-10-CM | POA: Diagnosis not present

## 2023-09-08 DIAGNOSIS — E1151 Type 2 diabetes mellitus with diabetic peripheral angiopathy without gangrene: Secondary | ICD-10-CM | POA: Diagnosis not present

## 2023-09-08 DIAGNOSIS — A4902 Methicillin resistant Staphylococcus aureus infection, unspecified site: Secondary | ICD-10-CM | POA: Diagnosis not present

## 2023-09-08 DIAGNOSIS — I119 Hypertensive heart disease without heart failure: Secondary | ICD-10-CM | POA: Diagnosis not present

## 2023-09-08 DIAGNOSIS — L97511 Non-pressure chronic ulcer of other part of right foot limited to breakdown of skin: Secondary | ICD-10-CM | POA: Diagnosis not present

## 2023-09-08 DIAGNOSIS — S2231XD Fracture of one rib, right side, subsequent encounter for fracture with routine healing: Secondary | ICD-10-CM | POA: Diagnosis not present

## 2023-09-08 DIAGNOSIS — E1165 Type 2 diabetes mellitus with hyperglycemia: Secondary | ICD-10-CM | POA: Diagnosis not present

## 2023-09-08 DIAGNOSIS — I10 Essential (primary) hypertension: Secondary | ICD-10-CM | POA: Diagnosis not present

## 2023-09-08 DIAGNOSIS — S12691D Other nondisplaced fracture of seventh cervical vertebra, subsequent encounter for fracture with routine healing: Secondary | ICD-10-CM | POA: Diagnosis not present

## 2023-09-09 DIAGNOSIS — E1165 Type 2 diabetes mellitus with hyperglycemia: Secondary | ICD-10-CM | POA: Diagnosis not present

## 2023-09-09 DIAGNOSIS — S2231XD Fracture of one rib, right side, subsequent encounter for fracture with routine healing: Secondary | ICD-10-CM | POA: Diagnosis not present

## 2023-09-09 DIAGNOSIS — E1151 Type 2 diabetes mellitus with diabetic peripheral angiopathy without gangrene: Secondary | ICD-10-CM | POA: Diagnosis not present

## 2023-09-09 DIAGNOSIS — S12691D Other nondisplaced fracture of seventh cervical vertebra, subsequent encounter for fracture with routine healing: Secondary | ICD-10-CM | POA: Diagnosis not present

## 2023-09-09 DIAGNOSIS — I119 Hypertensive heart disease without heart failure: Secondary | ICD-10-CM | POA: Diagnosis not present

## 2023-09-09 DIAGNOSIS — A4902 Methicillin resistant Staphylococcus aureus infection, unspecified site: Secondary | ICD-10-CM | POA: Diagnosis not present

## 2023-09-09 DIAGNOSIS — E11621 Type 2 diabetes mellitus with foot ulcer: Secondary | ICD-10-CM | POA: Diagnosis not present

## 2023-09-09 DIAGNOSIS — L03115 Cellulitis of right lower limb: Secondary | ICD-10-CM | POA: Diagnosis not present

## 2023-09-09 DIAGNOSIS — L97511 Non-pressure chronic ulcer of other part of right foot limited to breakdown of skin: Secondary | ICD-10-CM | POA: Diagnosis not present

## 2023-09-14 DIAGNOSIS — L97511 Non-pressure chronic ulcer of other part of right foot limited to breakdown of skin: Secondary | ICD-10-CM | POA: Diagnosis not present

## 2023-09-14 DIAGNOSIS — I119 Hypertensive heart disease without heart failure: Secondary | ICD-10-CM | POA: Diagnosis not present

## 2023-09-14 DIAGNOSIS — S12691D Other nondisplaced fracture of seventh cervical vertebra, subsequent encounter for fracture with routine healing: Secondary | ICD-10-CM | POA: Diagnosis not present

## 2023-09-14 DIAGNOSIS — E11621 Type 2 diabetes mellitus with foot ulcer: Secondary | ICD-10-CM | POA: Diagnosis not present

## 2023-09-14 DIAGNOSIS — L03115 Cellulitis of right lower limb: Secondary | ICD-10-CM | POA: Diagnosis not present

## 2023-09-14 DIAGNOSIS — E1151 Type 2 diabetes mellitus with diabetic peripheral angiopathy without gangrene: Secondary | ICD-10-CM | POA: Diagnosis not present

## 2023-09-14 DIAGNOSIS — S2231XD Fracture of one rib, right side, subsequent encounter for fracture with routine healing: Secondary | ICD-10-CM | POA: Diagnosis not present

## 2023-09-14 DIAGNOSIS — A4902 Methicillin resistant Staphylococcus aureus infection, unspecified site: Secondary | ICD-10-CM | POA: Diagnosis not present

## 2023-09-14 DIAGNOSIS — E1165 Type 2 diabetes mellitus with hyperglycemia: Secondary | ICD-10-CM | POA: Diagnosis not present

## 2023-09-16 DIAGNOSIS — S12691D Other nondisplaced fracture of seventh cervical vertebra, subsequent encounter for fracture with routine healing: Secondary | ICD-10-CM | POA: Diagnosis not present

## 2023-09-16 DIAGNOSIS — E1151 Type 2 diabetes mellitus with diabetic peripheral angiopathy without gangrene: Secondary | ICD-10-CM | POA: Diagnosis not present

## 2023-09-16 DIAGNOSIS — A4902 Methicillin resistant Staphylococcus aureus infection, unspecified site: Secondary | ICD-10-CM | POA: Diagnosis not present

## 2023-09-16 DIAGNOSIS — L97511 Non-pressure chronic ulcer of other part of right foot limited to breakdown of skin: Secondary | ICD-10-CM | POA: Diagnosis not present

## 2023-09-16 DIAGNOSIS — I119 Hypertensive heart disease without heart failure: Secondary | ICD-10-CM | POA: Diagnosis not present

## 2023-09-16 DIAGNOSIS — E11621 Type 2 diabetes mellitus with foot ulcer: Secondary | ICD-10-CM | POA: Diagnosis not present

## 2023-09-16 DIAGNOSIS — E1165 Type 2 diabetes mellitus with hyperglycemia: Secondary | ICD-10-CM | POA: Diagnosis not present

## 2023-09-16 DIAGNOSIS — L03115 Cellulitis of right lower limb: Secondary | ICD-10-CM | POA: Diagnosis not present

## 2023-09-16 DIAGNOSIS — S2231XD Fracture of one rib, right side, subsequent encounter for fracture with routine healing: Secondary | ICD-10-CM | POA: Diagnosis not present

## 2023-09-17 DIAGNOSIS — E1151 Type 2 diabetes mellitus with diabetic peripheral angiopathy without gangrene: Secondary | ICD-10-CM | POA: Diagnosis not present

## 2023-09-17 DIAGNOSIS — S2231XD Fracture of one rib, right side, subsequent encounter for fracture with routine healing: Secondary | ICD-10-CM | POA: Diagnosis not present

## 2023-09-17 DIAGNOSIS — S12691D Other nondisplaced fracture of seventh cervical vertebra, subsequent encounter for fracture with routine healing: Secondary | ICD-10-CM | POA: Diagnosis not present

## 2023-09-17 DIAGNOSIS — L97511 Non-pressure chronic ulcer of other part of right foot limited to breakdown of skin: Secondary | ICD-10-CM | POA: Diagnosis not present

## 2023-09-17 DIAGNOSIS — E1165 Type 2 diabetes mellitus with hyperglycemia: Secondary | ICD-10-CM | POA: Diagnosis not present

## 2023-09-17 DIAGNOSIS — L03115 Cellulitis of right lower limb: Secondary | ICD-10-CM | POA: Diagnosis not present

## 2023-09-17 DIAGNOSIS — E11621 Type 2 diabetes mellitus with foot ulcer: Secondary | ICD-10-CM | POA: Diagnosis not present

## 2023-09-17 DIAGNOSIS — I119 Hypertensive heart disease without heart failure: Secondary | ICD-10-CM | POA: Diagnosis not present

## 2023-09-17 DIAGNOSIS — A4902 Methicillin resistant Staphylococcus aureus infection, unspecified site: Secondary | ICD-10-CM | POA: Diagnosis not present

## 2023-09-19 DIAGNOSIS — I119 Hypertensive heart disease without heart failure: Secondary | ICD-10-CM | POA: Diagnosis not present

## 2023-09-19 DIAGNOSIS — L97511 Non-pressure chronic ulcer of other part of right foot limited to breakdown of skin: Secondary | ICD-10-CM | POA: Diagnosis not present

## 2023-09-19 DIAGNOSIS — L03115 Cellulitis of right lower limb: Secondary | ICD-10-CM | POA: Diagnosis not present

## 2023-09-19 DIAGNOSIS — A4902 Methicillin resistant Staphylococcus aureus infection, unspecified site: Secondary | ICD-10-CM | POA: Diagnosis not present

## 2023-09-19 DIAGNOSIS — S12691D Other nondisplaced fracture of seventh cervical vertebra, subsequent encounter for fracture with routine healing: Secondary | ICD-10-CM | POA: Diagnosis not present

## 2023-09-19 DIAGNOSIS — E1165 Type 2 diabetes mellitus with hyperglycemia: Secondary | ICD-10-CM | POA: Diagnosis not present

## 2023-09-19 DIAGNOSIS — S2231XD Fracture of one rib, right side, subsequent encounter for fracture with routine healing: Secondary | ICD-10-CM | POA: Diagnosis not present

## 2023-09-19 DIAGNOSIS — E11621 Type 2 diabetes mellitus with foot ulcer: Secondary | ICD-10-CM | POA: Diagnosis not present

## 2023-09-19 DIAGNOSIS — E1151 Type 2 diabetes mellitus with diabetic peripheral angiopathy without gangrene: Secondary | ICD-10-CM | POA: Diagnosis not present

## 2023-09-20 DIAGNOSIS — S8991XA Unspecified injury of right lower leg, initial encounter: Secondary | ICD-10-CM | POA: Diagnosis not present

## 2023-09-20 DIAGNOSIS — M25561 Pain in right knee: Secondary | ICD-10-CM | POA: Diagnosis not present

## 2023-09-20 DIAGNOSIS — S6992XA Unspecified injury of left wrist, hand and finger(s), initial encounter: Secondary | ICD-10-CM | POA: Diagnosis not present

## 2023-09-20 DIAGNOSIS — R630 Anorexia: Secondary | ICD-10-CM | POA: Diagnosis not present

## 2023-09-20 DIAGNOSIS — R634 Abnormal weight loss: Secondary | ICD-10-CM | POA: Diagnosis not present

## 2023-09-20 DIAGNOSIS — E11649 Type 2 diabetes mellitus with hypoglycemia without coma: Secondary | ICD-10-CM | POA: Diagnosis not present

## 2023-09-20 DIAGNOSIS — I10 Essential (primary) hypertension: Secondary | ICD-10-CM | POA: Diagnosis not present

## 2023-09-20 DIAGNOSIS — Z794 Long term (current) use of insulin: Secondary | ICD-10-CM | POA: Diagnosis not present

## 2023-09-20 DIAGNOSIS — W010XXA Fall on same level from slipping, tripping and stumbling without subsequent striking against object, initial encounter: Secondary | ICD-10-CM | POA: Diagnosis not present

## 2023-09-21 DIAGNOSIS — L97511 Non-pressure chronic ulcer of other part of right foot limited to breakdown of skin: Secondary | ICD-10-CM | POA: Diagnosis not present

## 2023-09-21 DIAGNOSIS — S12691D Other nondisplaced fracture of seventh cervical vertebra, subsequent encounter for fracture with routine healing: Secondary | ICD-10-CM | POA: Diagnosis not present

## 2023-09-21 DIAGNOSIS — E1165 Type 2 diabetes mellitus with hyperglycemia: Secondary | ICD-10-CM | POA: Diagnosis not present

## 2023-09-21 DIAGNOSIS — A4902 Methicillin resistant Staphylococcus aureus infection, unspecified site: Secondary | ICD-10-CM | POA: Diagnosis not present

## 2023-09-21 DIAGNOSIS — S2231XD Fracture of one rib, right side, subsequent encounter for fracture with routine healing: Secondary | ICD-10-CM | POA: Diagnosis not present

## 2023-09-21 DIAGNOSIS — I119 Hypertensive heart disease without heart failure: Secondary | ICD-10-CM | POA: Diagnosis not present

## 2023-09-21 DIAGNOSIS — E11621 Type 2 diabetes mellitus with foot ulcer: Secondary | ICD-10-CM | POA: Diagnosis not present

## 2023-09-21 DIAGNOSIS — E1151 Type 2 diabetes mellitus with diabetic peripheral angiopathy without gangrene: Secondary | ICD-10-CM | POA: Diagnosis not present

## 2023-09-21 DIAGNOSIS — L03115 Cellulitis of right lower limb: Secondary | ICD-10-CM | POA: Diagnosis not present

## 2023-09-24 DIAGNOSIS — A4902 Methicillin resistant Staphylococcus aureus infection, unspecified site: Secondary | ICD-10-CM | POA: Diagnosis not present

## 2023-09-24 DIAGNOSIS — S2231XD Fracture of one rib, right side, subsequent encounter for fracture with routine healing: Secondary | ICD-10-CM | POA: Diagnosis not present

## 2023-09-24 DIAGNOSIS — L03115 Cellulitis of right lower limb: Secondary | ICD-10-CM | POA: Diagnosis not present

## 2023-09-24 DIAGNOSIS — E11621 Type 2 diabetes mellitus with foot ulcer: Secondary | ICD-10-CM | POA: Diagnosis not present

## 2023-09-24 DIAGNOSIS — I119 Hypertensive heart disease without heart failure: Secondary | ICD-10-CM | POA: Diagnosis not present

## 2023-09-24 DIAGNOSIS — E1165 Type 2 diabetes mellitus with hyperglycemia: Secondary | ICD-10-CM | POA: Diagnosis not present

## 2023-09-24 DIAGNOSIS — S12691D Other nondisplaced fracture of seventh cervical vertebra, subsequent encounter for fracture with routine healing: Secondary | ICD-10-CM | POA: Diagnosis not present

## 2023-09-24 DIAGNOSIS — E1151 Type 2 diabetes mellitus with diabetic peripheral angiopathy without gangrene: Secondary | ICD-10-CM | POA: Diagnosis not present

## 2023-09-24 DIAGNOSIS — L97511 Non-pressure chronic ulcer of other part of right foot limited to breakdown of skin: Secondary | ICD-10-CM | POA: Diagnosis not present

## 2023-09-26 DIAGNOSIS — S12691D Other nondisplaced fracture of seventh cervical vertebra, subsequent encounter for fracture with routine healing: Secondary | ICD-10-CM | POA: Diagnosis not present

## 2023-09-26 DIAGNOSIS — E1151 Type 2 diabetes mellitus with diabetic peripheral angiopathy without gangrene: Secondary | ICD-10-CM | POA: Diagnosis not present

## 2023-09-26 DIAGNOSIS — E11621 Type 2 diabetes mellitus with foot ulcer: Secondary | ICD-10-CM | POA: Diagnosis not present

## 2023-09-26 DIAGNOSIS — E1165 Type 2 diabetes mellitus with hyperglycemia: Secondary | ICD-10-CM | POA: Diagnosis not present

## 2023-09-26 DIAGNOSIS — S2231XD Fracture of one rib, right side, subsequent encounter for fracture with routine healing: Secondary | ICD-10-CM | POA: Diagnosis not present

## 2023-09-26 DIAGNOSIS — I119 Hypertensive heart disease without heart failure: Secondary | ICD-10-CM | POA: Diagnosis not present

## 2023-09-26 DIAGNOSIS — A4902 Methicillin resistant Staphylococcus aureus infection, unspecified site: Secondary | ICD-10-CM | POA: Diagnosis not present

## 2023-09-26 DIAGNOSIS — L03115 Cellulitis of right lower limb: Secondary | ICD-10-CM | POA: Diagnosis not present

## 2023-09-26 DIAGNOSIS — L97511 Non-pressure chronic ulcer of other part of right foot limited to breakdown of skin: Secondary | ICD-10-CM | POA: Diagnosis not present
